# Patient Record
Sex: Male | Born: 1965 | Race: White | Hispanic: No | Marital: Married | Smoking: Never smoker
Health system: Southern US, Community
[De-identification: ages and names within clinical notes are randomized; demographics above are authoritative.]

## PROBLEM LIST (undated history)

## (undated) DIAGNOSIS — Z98811 Dental restoration status: Secondary | ICD-10-CM

## (undated) DIAGNOSIS — Z87442 Personal history of urinary calculi: Secondary | ICD-10-CM

## (undated) DIAGNOSIS — Z955 Presence of coronary angioplasty implant and graft: Secondary | ICD-10-CM

## (undated) DIAGNOSIS — E785 Hyperlipidemia, unspecified: Secondary | ICD-10-CM

## (undated) DIAGNOSIS — J189 Pneumonia, unspecified organism: Secondary | ICD-10-CM

## (undated) DIAGNOSIS — N200 Calculus of kidney: Secondary | ICD-10-CM

## (undated) DIAGNOSIS — I219 Acute myocardial infarction, unspecified: Secondary | ICD-10-CM

## (undated) DIAGNOSIS — J302 Other seasonal allergic rhinitis: Secondary | ICD-10-CM

## (undated) DIAGNOSIS — Z87820 Personal history of traumatic brain injury: Secondary | ICD-10-CM

## (undated) DIAGNOSIS — Z86718 Personal history of other venous thrombosis and embolism: Secondary | ICD-10-CM

## (undated) DIAGNOSIS — Z8711 Personal history of peptic ulcer disease: Secondary | ICD-10-CM

## (undated) DIAGNOSIS — K219 Gastro-esophageal reflux disease without esophagitis: Secondary | ICD-10-CM

## (undated) DIAGNOSIS — I10 Essential (primary) hypertension: Secondary | ICD-10-CM

## (undated) DIAGNOSIS — Z9889 Other specified postprocedural states: Secondary | ICD-10-CM

## (undated) DIAGNOSIS — R112 Nausea with vomiting, unspecified: Secondary | ICD-10-CM

## (undated) DIAGNOSIS — R454 Irritability and anger: Secondary | ICD-10-CM

## (undated) DIAGNOSIS — M1990 Unspecified osteoarthritis, unspecified site: Secondary | ICD-10-CM

## (undated) DIAGNOSIS — I2510 Atherosclerotic heart disease of native coronary artery without angina pectoris: Secondary | ICD-10-CM

## (undated) DIAGNOSIS — E663 Overweight: Secondary | ICD-10-CM

## (undated) DIAGNOSIS — F419 Anxiety disorder, unspecified: Secondary | ICD-10-CM

## (undated) HISTORY — DX: Essential (primary) hypertension: I10

## (undated) HISTORY — PX: CORONARY ANGIOPLASTY WITH STENT PLACEMENT: SHX49

## (undated) HISTORY — DX: Atherosclerotic heart disease of native coronary artery without angina pectoris: I25.10

## (undated) HISTORY — PX: CARPAL TUNNEL RELEASE: SHX101

## (undated) HISTORY — DX: Anxiety disorder, unspecified: F41.9

## (undated) HISTORY — PX: KNEE CARTILAGE SURGERY: SHX688

## (undated) HISTORY — DX: Hyperlipidemia, unspecified: E78.5

## (undated) HISTORY — PX: OTHER SURGICAL HISTORY: SHX169

## (undated) HISTORY — DX: Overweight: E66.3

## (undated) HISTORY — PX: KIDNEY STONE SURGERY: SHX686

## (undated) HISTORY — DX: Calculus of kidney: N20.0

## (undated) SURGERY — REPAIR, TENDON, BICEPS, DISTAL
Anesthesia: Choice | Laterality: Left

---

## 1997-02-11 HISTORY — PX: WRIST SURGERY: SHX841

## 1998-10-30 ENCOUNTER — Emergency Department (HOSPITAL_COMMUNITY): Admission: EM | Admit: 1998-10-30 | Discharge: 1998-10-30 | Payer: Self-pay | Admitting: Emergency Medicine

## 1998-11-06 ENCOUNTER — Ambulatory Visit (HOSPITAL_COMMUNITY): Admission: RE | Admit: 1998-11-06 | Discharge: 1998-11-06 | Payer: Self-pay | Admitting: Specialist

## 2003-01-05 ENCOUNTER — Emergency Department (HOSPITAL_COMMUNITY): Admission: EM | Admit: 2003-01-05 | Discharge: 2003-01-05 | Payer: Self-pay | Admitting: Emergency Medicine

## 2004-05-18 ENCOUNTER — Emergency Department (HOSPITAL_COMMUNITY): Admission: EM | Admit: 2004-05-18 | Discharge: 2004-05-18 | Payer: Self-pay | Admitting: Emergency Medicine

## 2004-08-04 ENCOUNTER — Ambulatory Visit (HOSPITAL_BASED_OUTPATIENT_CLINIC_OR_DEPARTMENT_OTHER): Admission: RE | Admit: 2004-08-04 | Discharge: 2004-08-04 | Payer: Self-pay | Admitting: Specialist

## 2004-08-04 HISTORY — PX: CHONDROPLASTY: SHX5177

## 2004-12-05 ENCOUNTER — Ambulatory Visit (HOSPITAL_BASED_OUTPATIENT_CLINIC_OR_DEPARTMENT_OTHER): Admission: RE | Admit: 2004-12-05 | Discharge: 2004-12-05 | Payer: Self-pay | Admitting: Specialist

## 2004-12-05 ENCOUNTER — Ambulatory Visit (HOSPITAL_COMMUNITY): Admission: RE | Admit: 2004-12-05 | Discharge: 2004-12-05 | Payer: Self-pay | Admitting: Specialist

## 2004-12-05 ENCOUNTER — Encounter (INDEPENDENT_AMBULATORY_CARE_PROVIDER_SITE_OTHER): Payer: Self-pay | Admitting: Specialist

## 2004-12-05 HISTORY — PX: PREPATELLAR BURSA EXCISION: SUR547

## 2005-03-14 DIAGNOSIS — Z86718 Personal history of other venous thrombosis and embolism: Secondary | ICD-10-CM

## 2005-03-14 HISTORY — DX: Personal history of other venous thrombosis and embolism: Z86.718

## 2008-03-23 ENCOUNTER — Emergency Department (HOSPITAL_COMMUNITY): Admission: EM | Admit: 2008-03-23 | Discharge: 2008-03-23 | Payer: Self-pay | Admitting: Emergency Medicine

## 2008-03-29 ENCOUNTER — Encounter: Admission: RE | Admit: 2008-03-29 | Discharge: 2008-03-29 | Payer: Self-pay | Admitting: Family Medicine

## 2008-03-29 ENCOUNTER — Emergency Department (HOSPITAL_COMMUNITY): Admission: EM | Admit: 2008-03-29 | Discharge: 2008-03-29 | Payer: Self-pay | Admitting: Emergency Medicine

## 2010-10-03 ENCOUNTER — Inpatient Hospital Stay (HOSPITAL_COMMUNITY): Admission: EM | Admit: 2010-10-03 | Discharge: 2010-10-06 | Payer: Self-pay | Admitting: Emergency Medicine

## 2010-10-03 ENCOUNTER — Ambulatory Visit: Payer: Self-pay | Admitting: Cardiology

## 2010-10-05 ENCOUNTER — Encounter (INDEPENDENT_AMBULATORY_CARE_PROVIDER_SITE_OTHER): Payer: Self-pay | Admitting: Internal Medicine

## 2010-10-06 HISTORY — PX: CARDIAC CATHETERIZATION: SHX172

## 2010-10-15 ENCOUNTER — Ambulatory Visit: Payer: Self-pay | Admitting: Cardiology

## 2010-10-15 ENCOUNTER — Encounter: Payer: Self-pay | Admitting: Cardiovascular Disease

## 2010-10-30 ENCOUNTER — Telehealth: Payer: Self-pay | Admitting: Cardiovascular Disease

## 2010-11-14 ENCOUNTER — Encounter: Admission: RE | Admit: 2010-11-14 | Discharge: 2010-11-14 | Payer: Self-pay | Admitting: Cardiology

## 2010-11-14 ENCOUNTER — Ambulatory Visit: Payer: Self-pay | Admitting: Cardiology

## 2010-11-14 ENCOUNTER — Encounter: Payer: Self-pay | Admitting: Cardiovascular Disease

## 2010-11-20 ENCOUNTER — Ambulatory Visit: Payer: Self-pay

## 2010-11-21 ENCOUNTER — Encounter: Payer: Self-pay | Admitting: Cardiovascular Disease

## 2010-11-21 ENCOUNTER — Encounter
Admission: RE | Admit: 2010-11-21 | Discharge: 2010-11-21 | Payer: Self-pay | Source: Home / Self Care | Attending: Cardiology | Admitting: Cardiology

## 2010-11-26 ENCOUNTER — Encounter: Payer: Self-pay | Admitting: Cardiovascular Disease

## 2010-11-26 ENCOUNTER — Ambulatory Visit: Payer: Self-pay | Admitting: Cardiology

## 2010-12-30 ENCOUNTER — Encounter: Payer: Self-pay | Admitting: Cardiovascular Disease

## 2011-01-01 ENCOUNTER — Ambulatory Visit
Admission: RE | Admit: 2011-01-01 | Discharge: 2011-01-01 | Payer: Self-pay | Source: Home / Self Care | Attending: Cardiovascular Disease | Admitting: Cardiovascular Disease

## 2011-01-01 ENCOUNTER — Ambulatory Visit: Payer: Self-pay | Admitting: Cardiology

## 2011-01-01 DIAGNOSIS — M79609 Pain in unspecified limb: Secondary | ICD-10-CM | POA: Insufficient documentation

## 2011-01-06 ENCOUNTER — Telehealth: Payer: Self-pay | Admitting: Cardiovascular Disease

## 2011-01-13 NOTE — Progress Notes (Signed)
Summary: groin pain since heart cath  Phone Note Call from Patient   Caller: Patient [phone] Reason for Call: Talk to Nurse Summary of Call: pt  had heart cath done a few weeks ago-having pain in groin since then-quite uncomfortable-pls advise  Initial call taken by: Glynda Jaeger,  October 30, 2010 10:47 AM  Follow-up for Phone Call        Pt had cardiac cath 10/06/10 by Dr Excell Seltzer. I spoke with the pt because his groin is still sore from cath.  The pt said it feels like a cramp in his leg that does not go away if he sits for a long time.  The pt denies swelling at site, but does have a small amount of bruising that is getting better.  The pt did follow-up with Dr Deborah [facility]alk post [facility].  I made the pt aware that the nerve that runs close to femerol artery could be irritated.  If the pt continues to have discomfort at groin site he will contact Dr Ronnald Nian office.  Follow-up by: Julieta Gutting, RN, BSN,  October 30, 2010 11:14 AM

## 2011-01-15 NOTE — Assessment & Plan Note (Signed)
Summary: 2nd opinion for groin pain   Visit Type:  Initial consult Referring Provider:  Dr Deborah Chalk  CC:  Groin pain .  History of Present Illness: 45 year-old male referred for groin pain following cardiac catheterization in October 2011. The patient underwent uncomplicated cardiac catheterization via the right femoral artery and hemostasis was achieved with a PerClose suture. He has developed chronic right groin pain ever since the procedure, and reports no improvement in the pain over time. He has been evaluated with a right groin duplex ultrasound demonstrating normal arterial and venous flow. CT scan showed no hemorrhage or vascular abnormalities.   The patient states the pain is in the right groin area and radiates down to the distal thigh just above the right knee. Pain is exacerbated by prolonged standing or playing the drums. Also worse with other movements involving leg flexion. No calf or foot pain. No other complaints. 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  Current Medications (verified): 1)  Gabapentin 100 Mg Caps (Gabapentin) .... 4 Am and 5 Pm 2)  Gemfibrozil 600 Mg Tabs (Gemfibrozil) .... Take 1 Tablet By Mouth Two Times A Day 3)  Humulin N Pen 100 Unit/ml Susp (Insulin Isophane Human) .... 50 Units Two Times A Day 4)  Vitamin C 500 Mg Tabs (Ascorbic Acid) .... Take 1 Tablet By Mouth Once A Day 5)  Fish Oil 1200 Mg Caps (Omega-3 Fatty Acids) .... Take 1 Capsule By Mouth Once A Day 6)  Metformin Hcl 1000 Mg Tabs (Metformin Hcl) .... Take 1 Tablet By Mouth Two Times A Day 7)  Lisinopril 10 Mg Tabs (Lisinopril) .... Take One Tablet By Mouth Daily 8)  Pravastatin Sodium 40 Mg Tabs (Pravastatin Sodium) .... Take One Tablet By Mouth Daily At Bedtime 9)  Aspirin 81 Mg Tbec (Aspirin) .... Take One Tablet By Mouth Daily 10)  Alprazolam 1 Mg Tabs (Alprazolam) .Marland Kitchen.. 1 Am and 3 At Bedtime 11)  Metoprolol Tartrate 50 Mg Tabs (Metoprolol Tartrate)  .... Take One Tablet By Mouth Twice A Day 12)  Nitrostat 0.4 Mg Subl (Nitroglycerin) .Marland Kitchen.. 1 Tablet Under Tongue At Onset of Chest Pain; You May Repeat Every 5 Minutes For Up To 3 Doses. 13)  E-400 400 Unit Caps (Vitamin E) .... Take 1 Capsule By Mouth Two Times A Day 14)  Januvia 100 Mg Tabs (Sitagliptin Phosphate) .... Take 1 Tablet By Mouth Once A Day 15)  Lidoderm 5 % Ptch (Lidocaine) .... One Every 12 Hours  Allergies (verified): No Known Drug Allergies  Past History:  Past medical history reviewed for relevance to current acute and chronic problems.  Past Medical History: Reviewed history from 12/31/2010 and no changes required.  1. History of coronary artery disease with a prior drug-eluting stent       to the right coronary artery 5 years back.   2. Hypertension.   3. Diabetes.   4. Combined dyslipidemia.   5. History of anxiety and depression.          Vital Signs:  Patient profile:   45 year old male Height:      71 inches Weight:      270 pounds BMI:     37.79 Pulse rate:   80 / minute Pulse rhythm:   regular Resp:     18 per minute BP sitting:   132 / 90  (right arm) Cuff size:   large  Vitals Entered By: Vikki Ports (January 01, 2011 12:05 PM)  Physical Exam  General:  Pt is alert and oriented, in no acute distress. HEENT: normal Lungs: CTA CV: RRR without murmur or gallop Abd: soft, NT, positive BS, no bruit, no organomegaly Ext: no clubbing, cyanosis, or edema. peripheral pulses 2+ and equal Right groin with mild tenderness to deep palpation. Femoral pulses 2+=. There is no right femoral bruit or mass.    Venous Doppler  Procedure date:  11/21/2010  Findings:      Normal right common femoral and artery  CT Scan  Procedure date:  11/14/2010  Findings:      Intact vascular structures, no RP hematoma.  Impression & Recommendations:  Problem # 1:  PAIN IN LIMB (ICD-729.5) Pt with chronic right groin pain after cardiac cath and PerClose  device for arterial hemostasis. There does not appear to be any gross vascular injury. I think the most likely etiology is nerve injury, but it is unusual that the patient would experience this degree of pain now 3 months removed from the procedure. I don't think further imaging studies will be revealing as he has already had a normal ultrasound and contrasted CT scan. I have recommended adding a 400 mg neurontin dose in the afternoon to his current twice daily dosing. I have also referred him to the pain management clinic as there may be another modality available to treat his pain that I am not aware of, i.e. nerve block.  Orders: Pain Clinic Referral (Pain)  Patient Instructions: 1)  Your physician recommends that you schedule a follow-up appointment with Dr Deborah Chalk.  2)  Your physician has recommended you make the following change in your medication: INCREASE Neurontin to 400mg  every morning, 400mg  every afternoon, and 500mg  at bedtime for one month--future prescriptions should come from Primary Care Provider 3)  You have been referred to the PAIN CLINIC.  Prescriptions: NEURONTIN 100 MG CAPS (GABAPENTIN) take 4 by mouth every morning, 4 by mouth every afternoon, and 5 by mouth at bedtime  #390 x 0   Entered by:   Julieta Gutting, RN, BSN   Authorized by:   Norva Karvonen, MD   Signed by:   Julieta Gutting, RN, BSN on 01/01/2011   Method used:   Electronically to        [facility]  [address] [phone]* (retail)       [address]       [place], [place]  [address]       Ph: [phone] or [phone]       Fax: [phone]   RxID:   [id number]

## 2011-01-15 NOTE — Progress Notes (Signed)
Summary: question re cath in 10/11- c/o pain with nerves- CALL AFTER 1PM  Phone Note Call from Patient Call back at Work Phone [phone] Call back at call after 1 p.m. due to job interview.    Caller: Patient Reason for Call: Talk to Nurse Summary of Call: pt have several question to ask. aware that lauren is not here today. pt had heart cath in 10/11. c/o still having problem with the nerve.can pt be referral to  [place] pain clinic.  Initial call taken by: Lorne Skeens,  January 06, 2011 10:33 AM  Follow-up for Phone Call        Pt. states since he had a cardiac cath. has been having pain in his right groin. Pt. states it always feels tight and when he moves certain way, it is very painful. It feels like it is a tear in there. He wanders if a stitch hit a nerve. Pt. would like to know if he can be referred to a pain clinic here in [facility] or [address], Where ever he can be seen sooner.  Spoke with the patient.  There is an order for a referral to the pain clinic. Advised patient I would make sure the schedulers are aware that this order is in & to call us back if he doesn't get a call within the next few days. Whitney Maeola Sarah RN  January 06, 2011 5:41 PM  Follow-up by: Ollen Gross, RN, BSN,  January 06, 2011 11:12 AM

## 2011-01-21 ENCOUNTER — Telehealth (INDEPENDENT_AMBULATORY_CARE_PROVIDER_SITE_OTHER): Payer: Self-pay | Admitting: *Deleted

## 2011-01-26 ENCOUNTER — Encounter: Payer: Self-pay | Admitting: Cardiovascular Disease

## 2011-01-29 NOTE — Progress Notes (Signed)
  Received pt's ROI via Fax.Marland KitchenMarland KitchenFaxed all Cardiac/Hospital recods over to [facility] attn Dr.Cheek @ [phone] [facility]  January 21, 2011 3:40 PM

## 2011-02-10 ENCOUNTER — Encounter: Payer: 59 | Attending: Physical Medicine & Rehabilitation

## 2011-02-10 ENCOUNTER — Ambulatory Visit (HOSPITAL_BASED_OUTPATIENT_CLINIC_OR_DEPARTMENT_OTHER): Payer: 59 | Admitting: Physical Medicine & Rehabilitation

## 2011-02-10 DIAGNOSIS — R109 Unspecified abdominal pain: Secondary | ICD-10-CM | POA: Insufficient documentation

## 2011-02-10 DIAGNOSIS — I2510 Atherosclerotic heart disease of native coronary artery without angina pectoris: Secondary | ICD-10-CM | POA: Insufficient documentation

## 2011-02-10 DIAGNOSIS — E119 Type 2 diabetes mellitus without complications: Secondary | ICD-10-CM | POA: Insufficient documentation

## 2011-02-10 DIAGNOSIS — G5720 Lesion of femoral nerve, unspecified lower limb: Secondary | ICD-10-CM

## 2011-02-10 DIAGNOSIS — Z79899 Other long term (current) drug therapy: Secondary | ICD-10-CM | POA: Insufficient documentation

## 2011-02-10 DIAGNOSIS — E7800 Pure hypercholesterolemia, unspecified: Secondary | ICD-10-CM | POA: Insufficient documentation

## 2011-02-10 NOTE — Letter (Signed)
Summary: [facility]   Imported By: Marylou Mccoy 02/04/2011 10:37:55  _____________________________________________________________________  External Attachment:    Type:   Image     Comment:   External Document

## 2011-02-10 NOTE — Letter (Signed)
Summary: [facility]  [facility]   Imported By: Marylou Mccoy 02/04/2011 10:39:25  _____________________________________________________________________  External Attachment:    Type:   Image     Comment:   External Document

## 2011-02-10 NOTE — Letter (Signed)
Summary: [facility]  [facility]   Imported By: Marylou Mccoy 02/04/2011 10:39:01  _____________________________________________________________________  External Attachment:    Type:   Image     Comment:   External Document

## 2011-02-10 NOTE — Letter (Signed)
Summary: [facility]  [facility]   Imported By: Marylou Mccoy 02/04/2011 10:23:47  _____________________________________________________________________  External Attachment:    Type:   Image     Comment:   External Document

## 2011-02-24 NOTE — Letter (Signed)
Summary: [facility]  [facility]   Imported By: Marylou Mccoy 02/18/2011 15:50:49  _____________________________________________________________________  External Attachment:    Type:   Image     Comment:   External Document

## 2011-02-25 LAB — CBC
HCT: 37.4 % — ABNORMAL LOW (ref 39.0–52.0)
HCT: 38.5 % — ABNORMAL LOW (ref 39.0–52.0)
HCT: 40.4 % (ref 39.0–52.0)
Hemoglobin: 13.9 g/dL (ref 13.0–17.0)
MCH: 29.4 pg (ref 26.0–34.0)
MCH: 30.7 pg (ref 26.0–34.0)
MCHC: 34 g/dL (ref 30.0–36.0)
MCHC: 34.3 g/dL (ref 30.0–36.0)
MCHC: 34.3 g/dL (ref 30.0–36.0)
MCHC: 34.4 g/dL (ref 30.0–36.0)
MCV: 88.8 fL (ref 78.0–100.0)
MCV: 89.2 fL (ref 78.0–100.0)
Platelets: 219 10*3/uL (ref 150–400)
Platelets: 230 10*3/uL (ref 150–400)
Platelets: 246 10*3/uL (ref 150–400)
RBC: 4.15 MIL/uL — ABNORMAL LOW (ref 4.22–5.81)
RBC: 4.53 MIL/uL (ref 4.22–5.81)
RDW: 12.5 % (ref 11.5–15.5)
RDW: 12.5 % (ref 11.5–15.5)
RDW: 12.6 % (ref 11.5–15.5)
RDW: 12.6 % (ref 11.5–15.5)
RDW: 12.6 % (ref 11.5–15.5)
WBC: 6.7 10*3/uL (ref 4.0–10.5)
WBC: 8.1 10*3/uL (ref 4.0–10.5)
WBC: 8.7 10*3/uL (ref 4.0–10.5)

## 2011-02-25 LAB — CARDIAC PANEL(CRET KIN+CKTOT+MB+TROPI)
CK, MB: 0.9 ng/mL (ref 0.3–4.0)
CK, MB: 1 ng/mL (ref 0.3–4.0)
CK, MB: 1.1 ng/mL (ref 0.3–4.0)
Relative Index: INVALID (ref 0.0–2.5)
Relative Index: INVALID (ref 0.0–2.5)
Relative Index: INVALID (ref 0.0–2.5)
Total CK: 80 U/L (ref 7–232)
Total CK: 84 U/L (ref 7–232)
Total CK: 90 U/L (ref 7–232)
Troponin I: <0.01 ng/mL (ref 0.00–0.06)
Troponin I: <0.01 ng/mL (ref 0.00–0.06)
Troponin I: <0.01 ng/mL (ref 0.00–0.06)

## 2011-02-25 LAB — COMPREHENSIVE METABOLIC PANEL
ALT: 25 U/L (ref 0–53)
AST: 23 U/L (ref 0–37)
Albumin: 4.1 g/dL (ref 3.5–5.2)
Alkaline Phosphatase: 40 U/L (ref 39–117)
BUN: 15 mg/dL (ref 6–23)
CO2: 23 mEq/L (ref 19–32)
Calcium: 9.6 mg/dL (ref 8.4–10.5)
Chloride: 103 mEq/L (ref 96–112)
Creatinine, Ser: 1.27 mg/dL (ref 0.4–1.5)
GFR calc Af Amer: >60 mL/min (ref >60)
GFR calc non Af Amer: >60 mL/min (ref >60)
Glucose, Bld: 110 mg/dL — ABNORMAL HIGH (ref 70–99)
Potassium: 3.9 mEq/L (ref 3.5–5.1)
Sodium: 137 mEq/L (ref 135–145)
Total Bilirubin: 0.5 mg/dL (ref 0.3–1.2)
Total Protein: 7.5 g/dL (ref 6.0–8.3)

## 2011-02-25 LAB — CK TOTAL AND CKMB (NOT AT ARMC)
CK, MB: 1.2 ng/mL (ref 0.3–4.0)
Relative Index: 1.1 (ref 0.0–2.5)
Total CK: 105 U/L (ref 7–232)

## 2011-02-25 LAB — GLUCOSE, CAPILLARY
Glucose-Capillary: 106 mg/dL — ABNORMAL HIGH (ref 70–99)
Glucose-Capillary: 106 mg/dL — ABNORMAL HIGH (ref 70–99)
Glucose-Capillary: 124 mg/dL — ABNORMAL HIGH (ref 70–99)
Glucose-Capillary: 154 mg/dL — ABNORMAL HIGH (ref 70–99)
Glucose-Capillary: 157 mg/dL — ABNORMAL HIGH (ref 70–99)
Glucose-Capillary: 160 mg/dL — ABNORMAL HIGH (ref 70–99)
Glucose-Capillary: 187 mg/dL — ABNORMAL HIGH (ref 70–99)
Glucose-Capillary: 188 mg/dL — ABNORMAL HIGH (ref 70–99)
Glucose-Capillary: 221 mg/dL — ABNORMAL HIGH (ref 70–99)
Glucose-Capillary: 65 mg/dL — ABNORMAL LOW (ref 70–99)
Glucose-Capillary: 86 mg/dL (ref 70–99)
Glucose-Capillary: 99 mg/dL (ref 70–99)

## 2011-02-25 LAB — HEPARIN LEVEL (UNFRACTIONATED)
Heparin Unfractionated: 0.17 IU/mL — ABNORMAL LOW (ref 0.30–0.70)
Heparin Unfractionated: 0.27 IU/mL — ABNORMAL LOW (ref 0.30–0.70)
Heparin Unfractionated: 0.34 IU/mL (ref 0.30–0.70)

## 2011-02-25 LAB — LIPID PANEL
Cholesterol: 178 mg/dL (ref 0–200)
HDL: 37 mg/dL — ABNORMAL LOW (ref >39)
LDL Cholesterol: 90 mg/dL (ref 0–99)
Total CHOL/HDL Ratio: 4.8 RATIO
Triglycerides: 253 mg/dL — ABNORMAL HIGH (ref <150)
VLDL: 51 mg/dL — ABNORMAL HIGH (ref 0–40)

## 2011-02-25 LAB — POCT CARDIAC MARKERS
CKMB, poc: <1 ng/mL — ABNORMAL LOW (ref 1.0–8.0)
Myoglobin, poc: 65.5 ng/mL (ref 12–200)
Troponin i, poc: <0.05 ng/mL (ref 0.00–0.09)

## 2011-02-25 LAB — MRSA PCR SCREENING: MRSA by PCR: NEGATIVE

## 2011-02-25 LAB — TROPONIN I
Troponin I: 0.02 ng/mL (ref 0.00–0.06)
Troponin I: <0.01 ng/mL (ref 0.00–0.06)

## 2011-02-25 LAB — HEMOGLOBIN A1C
Hgb A1c MFr Bld: 9.7 % — ABNORMAL HIGH (ref <5.7)
Mean Plasma Glucose: 232 mg/dL — ABNORMAL HIGH (ref <117)

## 2011-02-25 LAB — PROTIME-INR
INR: 1.03 (ref 0.00–1.49)
Prothrombin Time: 13.7 seconds (ref 11.6–15.2)

## 2011-02-25 LAB — APTT: aPTT: 31 seconds (ref 24–37)

## 2011-03-06 NOTE — Consult Note (Addendum)
CHIEF COMPLAINT:  Right groin pain.  A 45 year old male with history of coronary artery disease and diabetes as well as hypercholesterolemia who underwent a cardiac cath procedure in October 2011.  Postprocedure, he complained of groin pain on the right side, which was where the femoral sheath was introduced.  He had evaluation by Vascular Surgery, normal arterial and deep venous Dopplers.  He complains of some pain that radiates into the anterior thigh.  No weakness in the quad.  His pain does increase with playing drums.  His pain is described as constant and sharp when it radiates, but overall a dull ache about 5/10.  He has had fair relief with his Neurontin, has had somewhat better relief since increasing from 300 to 400 mg three times per day.  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  REVIEW OF SYSTEMS:  Positive for depression and blood sugar regulation problems, sees Dr. Evelene Croon from Psychiatry and Dr. Gerri Spore for his diabetes.  Denies any back pain.  No history of hip joint pain.  SOCIAL HISTORY:  Married, lives with his wife and two kids.  Denies any drug or alcohol abuse.  FAMILY HISTORY:  Heart disease, diabetes, hypertension, alcohol abuse.  PHYSICAL EXAMINATION:  VITAL SIGNS:  His blood pressure is 127/72, pulse 83, respiratory rate is 18, O2 sat 96% on room air. GENERAL:  He is an overweight male in no acute distress.  His gait is normal.  Affect is bright and alert. EXTREMITIES:  Without edema.  His lower extremity strength is normal in the hip flexors, knee extensors, ankle dorsiflexors.  His back has no tenderness to palpation.  He has normal lumbar range of motion.  He has normal sensation in bilateral lower extremities to pinprick, normal deep tendon reflexes in bilateral lower extremities.  Normal hip internal- external rotation.  Straight leg raising test is  negative.  His right inguinal area has a weak femoral pulse, but this has to do more with.  His body habitus is actually equal compared to the other side.  There is no evidence of scarring or skin breakdown.  Negative thrill.  His popliteal and pedal pulses are equal bilaterally in the lower extremities.  IMPRESSION:  Right groin pain radiating into the right thigh, appears to be femoral nerve irritation.  He is getting just sensory symptoms rather than motor.  We discussed treatment options.  Certainly, he has been responding to gabapentin and he is not on a full dose.  We will increase to 400 q.i.d. and then to 600 t.i.d. monitoring for drowsiness.  We discussed interventional pain procedures including femoral nerve block, which we can do with lidocaine and corticosteroid.  We discussed the method of performing this block, which would include ultrasound and nerve stimulation guidance and this may be only a short-term relief. Discussed risks and benefits of the procedure including vascular injury, nerve injury, increased pain or non-efficacy, the patient elects to proceed.  We discussed activity modification in particularly some of the driving activity that seemed to provoke this.  Discussed with the patient, agrees with plan.  We will schedule for right femoral nerve block.  He will need a driver given that he may get some sensory loss or even some of motor weakness in the right thigh at least for the postprocedural time.     [name], M.D.    Colan Neptune D:02/10/2011 16:07:39  T:02/10/2011 21:25:50  Job #:  [id number]  cc:   Veverly Fells. Excell Seltzer, MD [address] [place], [place] [address]  Colleen Can. Deborah Chalk, M.D. Fax: [phone]  Milagros Evener, M.D. Fax: [phone]  Carola J. Gerri Spore, M.D. Fax: [phone]  Electronically Signed by Claudette Laws M.D. on 02/14/2011 01:02:05 PM CHIEF COMPLAINT:  Right groin pain.  A 45 year old male with history of  coronary artery disease and diabetes as well as hypercholesterolemia who underwent a cardiac cath procedure in October 2011.  Postprocedure, he complained of groin pain on the right side, which was where the femoral sheath was introduced.  He had evaluation by Vascular Surgery, normal arterial and deep venous Dopplers.  He complains of some pain that radiates into the anterior thigh.  No weakness in the quad.  His pain does increase with playing drums.  His pain is described as constant and sharp when it radiates, but overall a dull ache about 5/10.  He has had fair relief with his Neurontin, has had somewhat better relief since increasing from 300 to 400 mg three times per day.  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  REVIEW OF SYSTEMS:  Positive for depression and blood sugar regulation problems, sees Dr. Evelene Croon from Psychiatry and Dr. Gerri Spore for his diabetes.  Denies any back pain.  No history of hip joint pain.  SOCIAL HISTORY:  Married, lives with his wife and two kids.  Denies any drug or alcohol abuse.  FAMILY HISTORY:  Heart disease, diabetes, hypertension, alcohol abuse.  PHYSICAL EXAMINATION:  VITAL SIGNS:  His blood pressure is 127/72, pulse 83, respiratory rate is 18, O2 sat 96% on room air. GENERAL:  He is an overweight male in no acute distress.  His gait is normal.  Affect is bright and alert. EXTREMITIES:  Without edema.  His lower extremity strength is normal in the hip flexors, knee extensors, ankle dorsiflexors.  His back has no tenderness to palpation.  He has normal lumbar range of motion.  He has normal sensation in bilateral lower extremities to pinprick, normal deep tendon reflexes in bilateral lower extremities.  Normal hip internal- external rotation.  Straight leg raising test is negative.  His right inguinal area has a weak femoral pulse, but this has to  do more with.  His body habitus is actually equal compared to the other side.  There is no evidence of scarring or skin breakdown.  Negative thrill.  His popliteal and pedal pulses are equal bilaterally in the lower extremities.  IMPRESSION:  Right groin pain radiating into the right thigh, appears to be femoral nerve irritation.  He is getting just sensory symptoms rather than motor.  We discussed treatment options.  Certainly, he has been responding to gabapentin and he is not on a full dose.  We will increase to 400 q.i.d. and then to 600 t.i.d. monitoring for drowsiness.  We discussed interventional pain procedures including femoral nerve block, which we can do with lidocaine and corticosteroid.  We discussed the method of performing this block, which would include ultrasound and nerve stimulation guidance and this may be only a short-term relief. Discussed risks and benefits of the procedure including vascular injury, nerve injury, increased pain or non-efficacy, the patient elects to proceed.  We discussed activity modification in particularly some of the driving activity that seemed to provoke this.  Discussed with the patient, agrees with plan.  We will  schedule for right femoral nerve block.  He will need a driver given that he may get some sensory loss or even some of motor weakness in the right thigh at least for the postprocedural time.     [name], M.D. Electronically Signed    AEK/MedQ D:02/10/2011 16:07:39  T:02/10/2011 21:25:50  Job #:  [id number]  cc:   Veverly Fells. Excell Seltzer, MD [address] [place], [place] [address]  Colleen Can. Deborah Chalk, M.D. Fax: [phone]  Milagros Evener, M.D. Fax: [phone]  Carola J. Gerri Spore, M.D. Fax: [phone]

## 2011-03-13 ENCOUNTER — Encounter: Payer: 59 | Attending: Physical Medicine & Rehabilitation

## 2011-03-13 DIAGNOSIS — R109 Unspecified abdominal pain: Secondary | ICD-10-CM | POA: Insufficient documentation

## 2011-03-13 DIAGNOSIS — E119 Type 2 diabetes mellitus without complications: Secondary | ICD-10-CM | POA: Insufficient documentation

## 2011-03-13 DIAGNOSIS — Z79899 Other long term (current) drug therapy: Secondary | ICD-10-CM | POA: Insufficient documentation

## 2011-03-13 DIAGNOSIS — E7800 Pure hypercholesterolemia, unspecified: Secondary | ICD-10-CM | POA: Insufficient documentation

## 2011-03-13 DIAGNOSIS — I2510 Atherosclerotic heart disease of native coronary artery without angina pectoris: Secondary | ICD-10-CM | POA: Insufficient documentation

## 2011-03-16 ENCOUNTER — Encounter: Payer: 59 | Attending: Physical Medicine & Rehabilitation

## 2011-03-16 ENCOUNTER — Ambulatory Visit (HOSPITAL_BASED_OUTPATIENT_CLINIC_OR_DEPARTMENT_OTHER): Payer: 59 | Admitting: Physical Medicine & Rehabilitation

## 2011-03-16 DIAGNOSIS — G5720 Lesion of femoral nerve, unspecified lower limb: Secondary | ICD-10-CM

## 2011-03-16 DIAGNOSIS — G5790 Unspecified mononeuropathy of unspecified lower limb: Secondary | ICD-10-CM | POA: Insufficient documentation

## 2011-03-17 NOTE — Procedures (Signed)
NAME:  Elijah Watkins, Elijah Watkins NO.:  [id number]  MEDICAL RECORD NO.:  [id number]           PATIENT TYPE:  LOCATION:                                 FACILITY:  PHYSICIAN:  [name], M.D.DATE OF BIRTH:  [birth year]  DATE OF PROCEDURE:  03/16/2011 DATE OF DISCHARGE:                              OPERATIVE REPORT  PROCEDURE:  This is right femoral nerve block under ultrasound guidance.  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  Pain is rated up to 6/10, and with certain activities even up to 9.  Informed consent was obtained after describing risks and benefits of the procedure with patient.  These include bleeding, bruising, and infection.  He elects to proceed and has given written consent.  The patient scanned right inguinal area, femoral artery, and femoral nerve identified area, marked and prepped with chlorhexidine, entered with 22-gauge 80-mm echo block needle.  Needle was directed to the area lateral to the left femoral artery under continuous ultrasound guidance. Color Doppler was utilized intermittently during the procedure.  Once target was reached, 1 mL of 40 mg/mL Depo-Medrol and 3 mL of 1% lidocaine injected.  The patient tolerated procedure well.  After appropriate post procedure monitoring, the patient was able to stand without evidence of weakness in his right quad.  The patient tolerated the procedure well.  Postprocedure instructions given.  Continue current medications including Lidoderm patch and Neurontin, and we will see him back in 2-3 weeks in followup.     [name], M.D. Electronically Signed    AEK/MEDQ  D:  03/16/2011 11:57:51  T:  03/17/2011 02:08:01  Job:  [id number]  cc:   Veverly Fells. Excell Seltzer, MD [address] [place], [place] [address]  Otilio Connors. Gerri Spore, M.D. Fax: [phone]

## 2011-03-19 ENCOUNTER — Ambulatory Visit: Payer: 59 | Admitting: Physical Medicine & Rehabilitation

## 2011-04-14 ENCOUNTER — Ambulatory Visit: Payer: 59 | Admitting: Physical Medicine & Rehabilitation

## 2011-04-17 ENCOUNTER — Ambulatory Visit (HOSPITAL_BASED_OUTPATIENT_CLINIC_OR_DEPARTMENT_OTHER): Payer: 59 | Admitting: Physical Medicine & Rehabilitation

## 2011-04-17 DIAGNOSIS — M79609 Pain in unspecified limb: Secondary | ICD-10-CM | POA: Insufficient documentation

## 2011-04-17 DIAGNOSIS — G608 Other hereditary and idiopathic neuropathies: Secondary | ICD-10-CM | POA: Insufficient documentation

## 2011-04-17 DIAGNOSIS — G5720 Lesion of femoral nerve, unspecified lower limb: Secondary | ICD-10-CM

## 2011-04-17 DIAGNOSIS — G5790 Unspecified mononeuropathy of unspecified lower limb: Secondary | ICD-10-CM | POA: Insufficient documentation

## 2011-04-17 DIAGNOSIS — Z79899 Other long term (current) drug therapy: Secondary | ICD-10-CM | POA: Insufficient documentation

## 2011-04-20 NOTE — Assessment & Plan Note (Signed)
REASON FOR VISIT:  Right thigh pain.  HISTORY:  A 45 year old male, who I saw last on March 16, 2011, when we did a right femoral nerve block under ultrasound guidance. Preinjection, his pain was 6/10 at rest, and 9/10 with activity even with Neurontin and Lidoderm patch.  He returns now 1 month.  He returns today.  His pain is in the 0-1 range.  He has quit using his Lidoderm patches, continues on his Neurontin.  REVIEW OF SYSTEMS:  Otherwise negative.  PHYSICAL EXAMINATION:  VITAL SIGNS:  Blood pressure 118/60, pulse 69, respirations 18, O2 sat 94% on room air. MUSCULOSKELETAL:  Gait is normal.  He has normal sensation in the right lower extremity.  Normal deep tendon reflexes.  Normal strength in hip flexor, knee extensor, and ankle dorsiflexor.  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  He has had a very good improvement after the nerve block.  No side effects.  As I discussed with patient, cannot predict the exact duration of response and also that I expect that with time his femoral neuropathy/neuralgia will improve.  PLAN:  I will see him back in 2 months, keep him on the current dose of Neurontin 600 t.i.d.  If he is still doing well at that time, we will initiate weaning at 400 mg.  If he has recurrence between now and then of his more severe pain, we will repeat the ultrasound-guided femoral nerve block.     [name], M.D. Electronically Signed    AEK/MedQ D:  04/17/2011 11:18:30  T:  04/17/2011 22:20:11  Job #:  [id number]  cc:   Veverly Fells. Excell Seltzer, MD [address] [place], [place] [address]  Otilio Connors. Gerri Spore, M.D. Fax: [phone]

## 2011-05-01 NOTE — Op Note (Signed)
NAME:  Elijah Watkins, Elijah Watkins NO.:  [id number]   MEDICAL RECORD NO.:  [id number]                   PATIENT TYPE:  AMB   LOCATION:  [facility]                                 FACILITY:  [facility]   PHYSICIAN:  Jene Every, M.D.                 DATE OF BIRTH:  04-Aug-1966   DATE OF PROCEDURE:  08/04/2004  DATE OF DISCHARGE:                                 OPERATIVE REPORT   PREOPERATIVE DIAGNOSES:  1. Chondral lesion medial femoral condyle.  2. Occult meniscus tear.  3. Status post medial collateral ligament tear.   POSTOPERATIVE DIAGNOSES:  1. Grade 3 lesion posterior medial femoral condyle.  2. Grade 3 lesion of the medial femoral condyle and the patella.  3. Pathologic plica.   PROCEDURES PERFORMED:  1. Exam under anesthesia.  2. Chondroplasty of medial femoral condyle.  3. Chondroplasty of patella.  4. Excision of pathologic plica.   SURGEON:  Javier Docker, M.D.   BRIEF HISTORY AND INDICATIONS:  This is a 45 year old, status post a knee  injury, sustained a grade 3 MCL tear that was treated conservatively.  The  patient had persistent pain to the knee.  Operative intervention was  indicated for diagnosis and treatment and exam under anesthesia to evaluate  the MCL.  Also had a grade 4 lesion to the medial femoral condyle that was  to be evaluated and a chondroplasty if noted to be pathologic as well as  rule out occult meniscus tear.  The patient also had a persistent patellar  bursitis that had been aspirated multiple times.  Risks and benefits  discussed including bleeding, infection, damage to neurovascular structures,  no change in symptoms, worsening symptoms, need for repeat debridement, etc.   TECHNIQUE:  The patient in the supine position.  After induction of adequate  general anesthesia and 1 g of Kefzol, the right lower extremity was prepped  and draped in the usual sterile fashion.  I examined the MCL.  There was no  instability to varus  valgus stressing in 0 or 30 degrees that was abnormal.  Next, a lateral parapatellar portal and a superomedial parapatellar portal  was fashioned with a #11 blade, the Ingress cannula atraumatically placed.  Irrigant was utilized to insufflate the joint.  Under direct visualization,  an 18 gauge needle was utilized to localize a medial parapatellar portal  which was then fashioned with a #11 blade, sparing the medial meniscus.  Inspection of the suprapatellar pouch.  There was a fairly large plica  draped in the superior and medial region over the medial femoral condyle  with flexion and extension.  There was some impingement over the condyle.  Grade 3 changes of the patella.  The gutters were unremarkable.  The ACL and  PCL were unremarkable in the medial compartment.  The meniscus was without  evidence of a tear and stable to probe palpation.  There were grade 3  lesions  on the medial femoral condyle and some grade 2 lesions on the tibial  plateau.  There was an unstable medial grade 3 lesion of the posterior  portion of the medial femoral condyle with the knee flexed beyond 90  degrees.  Collene Mares was introduced and utilized to perform a chondroplasty of  the medial femoral condyle to a stable base.  Again, reexamined the  meniscus.  No evidence of tear.  The ACL and PCL were unremarkable.  Lateral  compartment revealed normal femoral condyle.  Tibial plateau and meniscus  stable to probe palpation without evidence of tear.  The plica was excised  as well with a 4.2 cubic shaver.  I examined underneath the patella and  patellar fat pad.  No evidence of meniscal cyst.  Placed a needle in the  area of the prepatellar bursa to see if there was any connection to the  joint.  There was no extravasation of the joint fluid, only __________  bursal hematoma drained.  Next, the knee was copiously lavaged, reexamined  all compartments and no evidence of pathology.  Next, all instrumentation  was  removed.  The portals were closed with 4-0 nylon simple sutures.  Marcaine 0.25% with epinephrine was infiltrated in the joint.  Wound was  dressed sterilely.  He was awoken without difficulty and transported to the  recovery room in satisfactory condition.   The patient tolerated the procedure well, no complications.                                               Jene Every, M.D.    Cordelia Pen  D:  08/04/2004  T:  08/04/2004  Job:  [id number]

## 2011-05-01 NOTE — Op Note (Signed)
NAME:  Elijah Watkins, Elijah Watkins NO.:  [id number]   MEDICAL RECORD NO.:  [id number]          PATIENT TYPE:  AMB   LOCATION:  [facility]                         FACILITY:  [facility]   PHYSICIAN:  Jene Every, M.D.    DATE OF BIRTH:  [birth year]   DATE OF PROCEDURE:  12/05/2004  DATE OF DISCHARGE:                                 OPERATIVE REPORT   PREOPERATIVE DIAGNOSIS:  Prepatellar bursa and ganglion cyst.   POSTOPERATIVE DIAGNOSIS:  Prepatellar bursa and ganglion cyst.   PROCEDURE PERFORMED:  Excision of prepatellar ligament bursal cyst.   ANESTHESIA:  General.   SURGEON:  Javier Docker, M.D.   BRIEF HISTORY AND INDICATIONS:  This 45 year old has had a traumatic  prepatellar and mostly prepatellar ligament bursal cyst that has been  refractory to conservative treatment including multiple aspirations and  attempted corticosteroid injection.  The patient had persistent pain with  kneeling and with activities of daily living.  Due to that, he has requested  excision of the cyst.  I discussed risks and benefits, including bleeding,  infection, damage to vascular structures, recurrence of the cyst as well as  sending it to pathology for appropriate analysis.   TECHNIQUE:  The patient in supine position.  After induction of adequate  general anesthesia and 1 g of Kefzol, the right lower extremity was prepped  and draped in the usual sterile fashion.  A small amount of Marcaine with  epinephrine was infiltrated in the deep subcu tissue.  Incision was made  longitudinally over the patellar ligament.  Electrocautery was utilized to  achieve hemostasis with good bleeding to the subcutaneous tissues.  Noted  mainly was a ganglion cyst-like structure in the median subcutaneous tissue.  We meticulously dissected the cyst from the prepatellar ligament region.  This descended down to the patella ligament.  It measured approximately 1.5  cm x 1.5 cm.  It was fairly adherent distal.  Skin  flaps were developed on  either side of the cyst.  The rent in the cyst allowed a clear hemiacidrin-  type, watery fluid to be excised.  Excised the cyst and sent it to pathology  for appropriate analysis.  Fully evaluated the patellar ligament.  The  patellar ligament appeared to be fully intact.  I could see no evidence of  tear or see no extension of the cyst into the joint region.  Had a previous  arthroscopy with no extent noted on the arthroscopy to that cyst.  It was  all seen on MRI without evidence of that as well.  I did sent the cyst for  pathology.  Thinned some of the subcutaneous tissue out medially.  I then  copiously irrigated the wound and closed the subcutaneous tissue with 3-0  Vicryl simple sutures, and the subcutaneous tissue reapproximated with  Prolene.  Wound was reinforced with Steri-Strips.  Sterile dressing applied.  The patient was then awoken without difficulty and transported to the  recovery room in satisfactory condition.   The patient tolerated the procedure well with no complications.     Trey Paula  JB/MEDQ  D:  12/05/2004  T:  12/05/2004  Job:  [id number]

## 2011-05-26 ENCOUNTER — Other Ambulatory Visit: Payer: Self-pay | Admitting: Family Medicine

## 2011-05-26 ENCOUNTER — Ambulatory Visit
Admission: RE | Admit: 2011-05-26 | Discharge: 2011-05-26 | Disposition: A | Discharge status: Home / Self Care | Payer: 59 | Source: Ambulatory Visit | Attending: Family Medicine | Admitting: Family Medicine

## 2011-05-26 DIAGNOSIS — R062 Wheezing: Secondary | ICD-10-CM

## 2011-06-11 ENCOUNTER — Encounter: Payer: 59 | Attending: Physical Medicine & Rehabilitation

## 2011-06-11 ENCOUNTER — Encounter (HOSPITAL_BASED_OUTPATIENT_CLINIC_OR_DEPARTMENT_OTHER): Payer: 59 | Admitting: Physical Medicine & Rehabilitation

## 2011-06-11 DIAGNOSIS — G5790 Unspecified mononeuropathy of unspecified lower limb: Secondary | ICD-10-CM | POA: Insufficient documentation

## 2011-06-11 DIAGNOSIS — Z79899 Other long term (current) drug therapy: Secondary | ICD-10-CM | POA: Insufficient documentation

## 2011-06-11 DIAGNOSIS — G5720 Lesion of femoral nerve, unspecified lower limb: Secondary | ICD-10-CM

## 2011-06-11 DIAGNOSIS — G608 Other hereditary and idiopathic neuropathies: Secondary | ICD-10-CM | POA: Insufficient documentation

## 2011-06-11 DIAGNOSIS — M79609 Pain in unspecified limb: Secondary | ICD-10-CM | POA: Insufficient documentation

## 2011-06-12 NOTE — Procedures (Signed)
NAME:  Elijah Watkins, Elijah Watkins NO.:  [id number]  MEDICAL RECORD NO.:  [id number]           PATIENT TYPE:  LOCATION:                                 FACILITY:  PHYSICIAN:  [name], M.D.DATE OF BIRTH:  07-Jun-1966  DATE OF PROCEDURE:  06/11/2011 DATE OF DISCHARGE:                              OPERATIVE REPORT  PROCEDURE:  Right femoral nerve block under ultrasound guidance.  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  Pain is rated as severe with activity.  Work activities such as drumming is exacerbating his pain.  INTERVAL MEDICAL HISTORY:  On Effient, now that he underwent stent placement 1 week ago.  Informed consent was obtained after describing risks and benefits of the procedure with the patient.  These include bleeding, bruising, and infection.  We reiterated the additional risk of bleeding, hematoma formation due to his Effient.  He elects to proceed and has given written consent.  The patient was scanned in right inguinal area.  Femoral artery and femoral nerve identified.  Area was marked and prepped with Betadine, entered with 22-gauge 80-mm EchoBlock needle under direct visualization under ultrasound using Doppler ultrasound to identify the femoral artery.  The needle was directed to the area lateral to the left femoral artery under continuous ultrasound guidance.  Once target was reached, 1 mL of 40 mg/mL Depo-Medrol and 3 mL of 1% lidocaine injected.  The patient tolerated the procedure well.  No evidence of bleeding or bruising.  He was able to stand and walk without evidence of weakness in the right quad.  The patient tolerated the procedure well. Postprocedure instructions given.  We will see him back in 2 months for followup.  His wife is driving him home today.  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     [name], M.D. Electronically Signed    AEK/MEDQ   D:  06/11/2011 13:02:48  T:  06/11/2011 16:10:39  Job:  [id number]  cc:   Veverly Fells. Excell Seltzer, MD [address] [place], [place] [address]  Otilio Connors. Gerri Spore, M.D. Fax: [phone]

## 2011-06-19 ENCOUNTER — Ambulatory Visit: Payer: 59 | Admitting: Physical Medicine & Rehabilitation

## 2011-08-10 ENCOUNTER — Ambulatory Visit (HOSPITAL_BASED_OUTPATIENT_CLINIC_OR_DEPARTMENT_OTHER): Payer: 59 | Admitting: Physical Medicine & Rehabilitation

## 2011-08-10 ENCOUNTER — Encounter: Payer: 59 | Attending: Physical Medicine & Rehabilitation

## 2011-08-10 DIAGNOSIS — M79609 Pain in unspecified limb: Secondary | ICD-10-CM | POA: Insufficient documentation

## 2011-08-10 DIAGNOSIS — G5790 Unspecified mononeuropathy of unspecified lower limb: Secondary | ICD-10-CM | POA: Insufficient documentation

## 2011-08-10 DIAGNOSIS — G608 Other hereditary and idiopathic neuropathies: Secondary | ICD-10-CM | POA: Insufficient documentation

## 2011-08-10 DIAGNOSIS — Z79899 Other long term (current) drug therapy: Secondary | ICD-10-CM | POA: Insufficient documentation

## 2011-08-10 DIAGNOSIS — G5720 Lesion of femoral nerve, unspecified lower limb: Secondary | ICD-10-CM

## 2011-08-12 NOTE — Assessment & Plan Note (Signed)
REASON FOR VISIT:  Right groin and thigh pain.  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  He has undergone right femoral nerve block x2 under ultrasound guidance.  Last one was performed on June 11, 2011.  He has had complete relief of pain since that time.  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  He continues to take the Neurontin, but no longer uses the Lidoderm patch.  He had no complications despite being on Effient and has noted the block was performed under ultrasound guidance.  He did have numbness in that leg for 1 day.  His wife drove him home.  He reports no weakness or residual numbness in the right lower extremity.  His sleep is good.  Walking tolerance is 1 hour.  He climbs steps.  He drives.  He is self-employed as a Hospital doctor.  PHYSICAL EXAMINATION:  VITAL SIGNS:  Blood pressure 131/68, pulse 72, respirations 16, O2 sat 97% on room air. GENERAL:  No acute distress.  Mood and affect appropriate. EXTREMITIES:  His right lower extremity strength is normal.  He has no sensory deficits in the right side.  His right groin has no evidence of swelling.  No tenderness to palpation in the femoral triangle area.  IMPRESSION:  Femoral neuropathy, symptomatically improving.  As I told the patient, he may have spontaneous regeneration and improvement over the course of 18 months.  This would take him out to April 2013.  I could not predict for him how long this current block would last or whether he would need a repeat block.  I will see him back in 3 months unless he has difficulty before that time.  At that time, if he is still relatively pain free, we will start weaning the Neurontin.  Discussed with the patient, agrees with plan.     [name], M.D. Electronically Signed    AEK/MedQ D:  08/10/2011 09:37:34  T:  08/10/2011 11:06:20  Job #:  [id number]  cc:    Veverly Fells. Excell Seltzer, MD [address] [place], [place] [address]  Otilio Connors. Gerri Spore, M.D. Fax: [phone]

## 2011-11-10 ENCOUNTER — Ambulatory Visit: Payer: 59 | Admitting: Physical Medicine & Rehabilitation

## 2011-11-12 ENCOUNTER — Encounter: Payer: 59 | Attending: Physical Medicine & Rehabilitation

## 2011-11-12 ENCOUNTER — Encounter (HOSPITAL_BASED_OUTPATIENT_CLINIC_OR_DEPARTMENT_OTHER): Payer: 59 | Admitting: Physical Medicine & Rehabilitation

## 2011-11-12 DIAGNOSIS — G5720 Lesion of femoral nerve, unspecified lower limb: Secondary | ICD-10-CM

## 2011-11-12 DIAGNOSIS — G5790 Unspecified mononeuropathy of unspecified lower limb: Secondary | ICD-10-CM | POA: Insufficient documentation

## 2011-11-12 NOTE — Procedures (Signed)
NAME:  Elijah Watkins, Elijah Watkins NO.:  [id number]  MEDICAL RECORD NO.:  [id number]           PATIENT TYPE:  O  LOCATION:  [facility]                          FACILITY:  [facility]  PHYSICIAN:  [name], M.D.DATE OF BIRTH:  June 20, 1966  DATE OF PROCEDURE:  11/12/2011 DATE OF DISCHARGE:                              OPERATIVE REPORT  PROCEDURE:  Right femoral nerve block under ultrasound guidance.  INDICATION:  Femoral neuralgia, status post cardiac catheterization. Pain is only partially responsive to medication management and interfering with activity.  Informed consent was obtained after describing risks and benefits of the procedure.  He is on Effient and aware of increased bleeding risk. The area was scanned with ultrasound.  Femoral artery identified.  Area was marked and prepped with Betadine, alcohol, 25-gauge inch and half needle was used to anesthetize skin and subcu tissue with 1% lidocaine x2 mL.  Then, a 22-gauge, 10 cm echo block needle was inserted under direct ultrasound visualization in the long axis.  The femoral artery and femoral nerve were identified.  The ilioinguinal fascia was pierced and paraesthesias were elicited.  1 mL of 40 mg/mL Depo-Medrol and 4 mL of 0.25% Marcaine injected.  The patient tolerated the procedure well. Postprocedure instructions given.     [name], M.D. Electronically Signed    AEK/MEDQ  D:  11/12/2011 09:60:45  T:  11/12/2011 11:32:48  Job:  [id number]  cc:   Dr. Excell Seltzer

## 2011-12-15 HISTORY — PX: CARPAL TUNNEL RELEASE: SHX101

## 2012-02-24 ENCOUNTER — Encounter (INDEPENDENT_AMBULATORY_CARE_PROVIDER_SITE_OTHER): Payer: Self-pay | Admitting: General Surgery

## 2012-02-24 ENCOUNTER — Ambulatory Visit (INDEPENDENT_AMBULATORY_CARE_PROVIDER_SITE_OTHER): Payer: 59 | Admitting: General Surgery

## 2012-02-24 ENCOUNTER — Emergency Department (HOSPITAL_COMMUNITY)
Admission: EM | Admit: 2012-02-24 | Discharge: 2012-02-24 | Disposition: A | Discharge status: Home / Self Care | Payer: 59 | Attending: General Surgery | Admitting: General Surgery

## 2012-02-24 ENCOUNTER — Telehealth (INDEPENDENT_AMBULATORY_CARE_PROVIDER_SITE_OTHER): Payer: Self-pay | Admitting: General Surgery

## 2012-02-24 ENCOUNTER — Encounter (HOSPITAL_COMMUNITY): Payer: Self-pay | Admitting: Emergency Medicine

## 2012-02-24 VITALS — BP 134/82 | HR 70 | Temp 97.8°F | Resp 16 | Ht 71.0 in | Wt 264.6 lb

## 2012-02-24 DIAGNOSIS — E119 Type 2 diabetes mellitus without complications: Secondary | ICD-10-CM

## 2012-02-24 DIAGNOSIS — I10 Essential (primary) hypertension: Secondary | ICD-10-CM

## 2012-02-24 DIAGNOSIS — M109 Gout, unspecified: Secondary | ICD-10-CM | POA: Insufficient documentation

## 2012-02-24 DIAGNOSIS — Z7901 Long term (current) use of anticoagulants: Secondary | ICD-10-CM | POA: Insufficient documentation

## 2012-02-24 DIAGNOSIS — IMO0002 Reserved for concepts with insufficient information to code with codable children: Secondary | ICD-10-CM | POA: Insufficient documentation

## 2012-02-24 DIAGNOSIS — Y838 Other surgical procedures as the cause of abnormal reaction of the patient, or of later complication, without mention of misadventure at the time of the procedure: Secondary | ICD-10-CM | POA: Insufficient documentation

## 2012-02-24 DIAGNOSIS — F32A Depression, unspecified: Secondary | ICD-10-CM | POA: Insufficient documentation

## 2012-02-24 DIAGNOSIS — I2510 Atherosclerotic heart disease of native coronary artery without angina pectoris: Secondary | ICD-10-CM | POA: Insufficient documentation

## 2012-02-24 DIAGNOSIS — E669 Obesity, unspecified: Secondary | ICD-10-CM

## 2012-02-24 DIAGNOSIS — E1129 Type 2 diabetes mellitus with other diabetic kidney complication: Secondary | ICD-10-CM | POA: Insufficient documentation

## 2012-02-24 DIAGNOSIS — L0501 Pilonidal cyst with abscess: Secondary | ICD-10-CM | POA: Insufficient documentation

## 2012-02-24 DIAGNOSIS — I252 Old myocardial infarction: Secondary | ICD-10-CM | POA: Insufficient documentation

## 2012-02-24 DIAGNOSIS — F329 Major depressive disorder, single episode, unspecified: Secondary | ICD-10-CM | POA: Insufficient documentation

## 2012-02-24 MED ORDER — SILVER NITRATE-POT NITRATE 75-25 % EX MISC
CUTANEOUS | Status: AC
  Filled 2012-02-24: qty 2

## 2012-02-24 NOTE — Progress Notes (Signed)
Patient ID: Elijah Watkins, male   DOB: Sep 29, 1966, 46 y.o.   MRN: [id number]  Chief Complaint  Patient presents with  . Follow-up    Peri rectal abscess    HPI Elijah Watkins is a 46 y.o. male.  He is referred by Dr. Darrow Bussing at [facility] [place] for management of an abscess in the gluteal area.  This patient has diabetes mellitus, chronic anxiety and depression, hypertension, coronary or disease, myocardial infarction in the past, neuropathy, kidney stones. He is a Musician.  He states that 3 months ago he developed a painful swollen area at the top of his intergluteal cleft. He has never had this happen before. He denies any gluteal or rectal surgery. He's been treated with antibiotics 3 times for this. It has never opened up and drained but it it does get hard and tender and then subsides. He saw Dr. Docia Chuck yesterday and was placed on Bactrim and was referred to our office. We are happy to see him in the urgent office today. HPI  Past Medical History  Diagnosis Date  . Perirectal abscess   . Heart attack     2 stents placed  . Rectal pain   . Diabetes mellitus     type 2  . Gout   . Hyperlipidemia   . Hypertension   . CAD (coronary artery disease)   . Overweight   . Nephrolithiasis     Past Surgical History  Procedure Date  . Coronary angioplasty with stent placement   . Kidney stone surgery 1988 - 1998    3 procedures done  . Wrist surgery 02/1997    right - due to tendonitis  . Knee cartilage surgery 10/2003, 11/2003    Also had bursa removed    History reviewed. No pertinent family history.  Social History History  Substance Use Topics  . Smoking status: Former Smoker    Quit date: 02/24/1984  . Smokeless tobacco: Never Used  . Alcohol Use: No     rare    No Known Allergies  Current Outpatient Prescriptions  Medication Sig Dispense Refill  . KOMBIGLYZE XR 2.04-999 MG TB24       . sulfamethoxazole-trimethoprim (BACTRIM DS)  800-160 MG per tablet Every 12 hours.      . ALPRAZolam (XANAX) 1 MG tablet Take 1 QID PRN by mouth      . aspirin 81 MG tablet Take 81 mg by mouth daily.      . fish oil-omega-3 fatty acids 1000 MG capsule Take 1 g by mouth daily.      Marland Kitchen gabapentin (NEURONTIN) 600 MG tablet Take 600 mg by mouth 3 (three) times daily.      Marland Kitchen gemfibrozil (LOPID) 600 MG tablet Take 600 mg by mouth 2 (two) times daily before a meal.      . insulin NPH (HUMULIN N,NOVOLIN N) 100 UNIT/ML injection Inject 60 Units into the skin 2 (two) times daily. 6 am and 6pm      . lidocaine (LIDODERM) 5 % Place 1 patch onto the skin daily. Remove & Discard patch within 12 hours or as directed by MD      . lisinopril (PRINIVIL,ZESTRIL) 10 MG tablet Take 10 mg by mouth daily.      . metoprolol (LOPRESSOR) 50 MG tablet Take 50 mg by mouth 2 (two) times daily.      . nitroGLYCERIN (NITROSTAT) 0.4 MG SL tablet Place 0.4 mg under the tongue every 5 (five) minutes  as needed.      . prasugrel (EFFIENT) 10 MG TABS Take 10 mg by mouth daily.      . pravastatin (PRAVACHOL) 40 MG tablet Take 1 1/2 tablet (60mg ) by mouth daily      . ranolazine (RANEXA) 500 MG 12 hr tablet Take 500 mg by mouth 2 (two) times daily.      . sitaGLIPtan-metformin (JANUMET) 50-500 MG per tablet Take 1 tablet by mouth 2 (two) times daily with a meal.      . vitamin C (ASCORBIC ACID) 500 MG tablet Take 500 mg by mouth daily.        Review of Systems Review of Systems  Constitutional: Negative for fever, chills and unexpected weight change.  HENT: Negative for hearing loss, congestion, sore throat, trouble swallowing and voice change.   Eyes: Negative for visual disturbance.  Respiratory: Negative for cough and wheezing.   Cardiovascular: Negative for chest pain, palpitations and leg swelling.  Gastrointestinal: Negative for nausea, vomiting, abdominal pain, diarrhea, constipation, blood in stool, abdominal distention, anal bleeding and rectal pain.    Genitourinary: Negative for hematuria and difficulty urinating.  Musculoskeletal: Negative for arthralgias.  Skin: Negative for rash and wound.  Neurological: Negative for seizures, syncope, weakness and headaches.  Hematological: Negative for adenopathy. Does not bruise/bleed easily.  Psychiatric/Behavioral: Negative for confusion.    Blood pressure 134/82, pulse 70, temperature 97.8 F (36.6 C), temperature source Temporal, resp. rate 16, height 5\' 11"  (1.803 m), weight 264 lb 9.6 oz (120.022 kg).  Physical Exam Physical Exam  Constitutional: He appears well-developed and well-nourished. No distress.  Cardiovascular: Normal rate, normal heart sounds and intact distal pulses.   Pulmonary/Chest: Effort normal and breath sounds normal. No respiratory distress.  Abdominal: Soft. Bowel sounds are normal. He exhibits no distension. There is no tenderness.  Skin: Skin is warm and dry. No rash noted. He is not diaphoretic. There is erythema. No pallor.       Numerous tattoos throughout. 2 cm tender, erythematous swelling in the pilonidal region just to the left of the midline. Not very fluctuant but obviously infected. No other scars. Perianal and perirectal area is normal.  Psychiatric: He has a normal mood and affect. His behavior is normal. Judgment and thought content normal.    Data Reviewed I reviewed Dr. Glendale Chard office notes.  Assessment    Small, recurrent pilonidal abscess.  Procedure note. Patient placed prone. Informed consent given. Procedure and risks discussed. Following Betadine prep this area was anesthetized with 1% Xylocaine. A 1.5 cm incision was made. A small abscess was drained. Minimal bleeding. Wound packed with iodoform gauze. Tolerated very well. Comfortable at end of procedure.  Coronary disease status post myocardial-infarction  Diabetes mellitus  Anxiety and depression  Neuropathy  Hypertension  Obesity.    Plan    Wound care discussed. Written  instructions given. He will continue the antibiotics until they are completely gone.  Return to see me in approximately 10 days  Since this is the first time that this has required a procedure, I told him that we would try to see if this would heal primarily. If he develops a recurrent abscess in the future he may need a more formal pilonidal cystectomy.       Angelia Mould. Derrell Lolling, M.D., [facility] General and Minimally invasive Surgery Breast and Colorectal Surgery Office:   [phone] Pager:   [phone]  02/24/2012, 5:18 PM

## 2012-02-24 NOTE — ED Notes (Signed)
Pt arrived he had been in surgery office t Dr. Caralyn Guile and his partner had worked on a cyst in his anal area.  It started to bleed when he went home and would not stop no matter what they tried so he came to the ER via EMS nd Dr. Carolynne Edouard was here so he took care of the bleed using silver nitrate sticks and 4X4's and was able to get the bleeding stopped.  He put 4X4's in the wound and this nurse covered with 4X4's and taped to secure. Wife came back and Dr. Carolynne Edouard showed her how to keep wound packed cleaned and dry. And instructred him to call the office tomorrow and see them and notify of any further problems.  No instrctions were given by Dr Carolynne Edouard and patient was DC'd home with wife and perscription.  All instructions were repeated to this nurse by the patient and he verbalized understanding and instructed pt about meds and he understood that also. Wife also verb understanding of treatment of wound care and prescription

## 2012-02-24 NOTE — Patient Instructions (Signed)
You have a small, recurrent pilonidal abscess. We performed incision and drainage today and put some packing in the open wound.  Take all of the antibiotics until they are completely gone.  Take a warm sitz bath or warm tub bath 3 times a day and cover the open wound with dry gauze.  Remove the packing tomorrow night and do not repack it.  Return to see Dr. Derrell Lolling in approximately 10 days.

## 2012-02-24 NOTE — ED Notes (Signed)
This nurse was also in with the MD the entire time

## 2012-02-24 NOTE — Consult Note (Signed)
Reason for Consult:bleeding Referring Physician: none  ZAIDIN BLYDEN is an 46 y.o. male.  HPI: Had I+D of pilonidal abscess earlier today in our office. He is on blood thinners. Started bleeding after he left office  Past Medical History  Diagnosis Date  . Perirectal abscess   . Heart attack     2 stents placed  . Rectal pain   . Diabetes mellitus     type 2  . Gout   . Hyperlipidemia   . Hypertension   . CAD (coronary artery disease)   . Overweight   . Nephrolithiasis     Past Surgical History  Procedure Date  . Coronary angioplasty with stent placement   . Kidney stone surgery 1988 - 1998    3 procedures done  . Wrist surgery 02/1997    right - due to tendonitis  . Knee cartilage surgery 10/2003, 11/2003    Also had bursa removed    No family history on file.  Social History:  reports that he quit smoking about 28 years ago. He has never used smokeless tobacco. He reports that he does not drink alcohol or use illicit drugs.  Allergies: No Known Allergies  Medications: I have reviewed the patient's current medications.  No results found for this or any previous visit (from the past 48 hour(s)).  No results found.  Review of Systems  Constitutional: Negative.   HENT: Negative.   Eyes: Negative.   Respiratory: Negative.   Cardiovascular: Negative.   Gastrointestinal: Negative.   Genitourinary: Negative.   Musculoskeletal: Negative.   Skin: Negative.   Neurological: Negative.   Endo/Heme/Allergies: Bruises/bleeds easily.  Psychiatric/Behavioral: Negative.    There were no vitals taken for this visit. Physical Exam  Constitutional: He is oriented to person, place, and time. He appears well-developed and well-nourished.  HENT:  Head: Normocephalic and atraumatic.  Eyes: Conjunctivae and EOM are normal. Pupils are equal, round, and reactive to light.  Neck: Normal range of motion. Neck supple.  Cardiovascular: Normal rate, regular rhythm and normal  heart sounds.   Respiratory: Effort normal and breath sounds normal.  GI: Soft. Bowel sounds are normal.  Genitourinary:       Bleeding from I+D site in gluteal cleft  Musculoskeletal: Normal range of motion.  Neurological: He is alert and oriented to person, place, and time.  Skin: Skin is warm and dry.  Psychiatric: He has a normal mood and affect. His behavior is normal.    Assessment/Plan: Applied silver nitrate to wound several times and held pressure until wound was dry. Repacked. He tolerated it well. We will follow him up closely in clinic  TOTH III,Sharmane Dame S 02/24/2012, 8:25 PM

## 2012-02-26 ENCOUNTER — Encounter (INDEPENDENT_AMBULATORY_CARE_PROVIDER_SITE_OTHER): Payer: 59 | Admitting: Surgery

## 2012-02-26 ENCOUNTER — Encounter (INDEPENDENT_AMBULATORY_CARE_PROVIDER_SITE_OTHER): Payer: 59 | Admitting: General Surgery

## 2012-02-26 ENCOUNTER — Ambulatory Visit (INDEPENDENT_AMBULATORY_CARE_PROVIDER_SITE_OTHER): Payer: 59 | Admitting: Surgery

## 2012-02-26 ENCOUNTER — Encounter (INDEPENDENT_AMBULATORY_CARE_PROVIDER_SITE_OTHER): Payer: Self-pay | Admitting: Surgery

## 2012-02-26 VITALS — BP 156/90 | HR 70 | Temp 97.8°F | Resp 18 | Ht 71.0 in | Wt 259.4 lb

## 2012-02-26 DIAGNOSIS — L0501 Pilonidal cyst with abscess: Secondary | ICD-10-CM

## 2012-02-26 NOTE — Progress Notes (Signed)
[facility]  Ovidio Kin, MD,  FACS [address] [place]  [address] [place], [place]    [address] Phone:  [phone] FAX:  [phone]   Re:   Elijah Watkins DOB:   07/25/1966 MRN:   [id number]  Urgent Office  ASSESSMENT AND PLAN: 1.  Pilonidal abscess - drained 02/24/2012 - H. Marjo Bicker to ER that night for bleeding that night - PJ Carolynne Edouard  Wife has changed the bandage a couple of times.  Bleeding better.  Wound okay.  To start stiz baths - TID  See Dr. Derrell Lolling in 7 to 14 days.  2. Coronary disease status post myocardial-infarction.  On aspirin/Effient.  This was probably part of the cause of the bleed. 3. Diabetes mellitus.  On insulin.  Blood sugars have been okay. 4. Anxiety and depression  5. Neuropathy  6. Hypertension  7. Obesity.  8.  Lots of tattoos.   HISTORY OF PRESENT ILLNESS: Chief Complaint  Patient presents with  . Follow-up    Pilonidal cyst    Elijah Watkins is a 46 y.o. (DOB: 1966/11/24)  white male who is a patient of Elie Confer, MD, MD and comes to me today for follow up for pilonidal abscess.  Doing better than the other day.  Wife with him.  He works as a Surveyor, minerals.  No further bleeding.  PHYSICAL EXAM: BP 156/90  Pulse 70  Temp(Src) 97.8 F (36.6 C) (Temporal)  Resp 18  Ht 5\' 11"  (1.803 m)  Wt 259 lb 6.4 oz (117.663 kg)  BMI 36.18 kg/m2  Buttocks:  3 cm wound okay.  I cleaned out some old blood, but overall the wound looks good.  DATA REVIEWED: Old chart.  Ovidio Kin, MD, FACS Office:  [phone]

## 2012-02-26 NOTE — Patient Instructions (Signed)
1.  Start soaking wound in bath tub - three times per day - tomorrow AM.  2.  To see Dr. Derrell Lolling back in 7 to 14 days.

## 2012-03-03 ENCOUNTER — Encounter (INDEPENDENT_AMBULATORY_CARE_PROVIDER_SITE_OTHER): Payer: Self-pay | Admitting: General Surgery

## 2012-03-03 ENCOUNTER — Ambulatory Visit (INDEPENDENT_AMBULATORY_CARE_PROVIDER_SITE_OTHER): Payer: 59 | Admitting: General Surgery

## 2012-03-03 VITALS — BP 146/80 | HR 105 | Temp 96.9°F | Ht 71.0 in | Wt 256.1 lb

## 2012-03-03 DIAGNOSIS — L0501 Pilonidal cyst with abscess: Secondary | ICD-10-CM

## 2012-03-03 NOTE — Progress Notes (Signed)
Subjective:     Patient ID: GARLAND HINCAPIE, male   DOB: [birth year], 46 y.o.   MRN: [id number]  HPI Status post incision and drainage of a small pilonidal abscess on March 13. This was his first episode. His wife has been having him take care of the wound. He says the pain is less and the drainage is less.  Review of Systems     Objective:   Physical Exam Patient is in no distress.  Wound is about 1.5 cm in length. A little bit of brownish exudate on the surface appeared healthy granulation tissue beneath. No undrained periods.  Repacked gently.    Assessment:     Small, initial pilonidal abscess, healing by secondary intention following incision and drainage    Plan:     Wound care discussed discussed. Return to see me if it does not completely heal in 4 weeks. Otherwise see me p.r.n. further problems.   Angelia Mould. Derrell Lolling, M.D., [facility] General and Minimally invasive Surgery Breast and Colorectal Surgery Office:   [phone] Pager:   [phone]

## 2012-03-03 NOTE — Patient Instructions (Signed)
The infection in your pilonidal abscess appears to be coming under control.  Take warm tub baths twice a day. Have your wife gently pack the wound with the gauze that I gave you.  The wound should completely heal in 3 or 4 weeks. If it has not completely healed in 4 weeks return to see me.

## 2012-04-04 ENCOUNTER — Ambulatory Visit: Payer: 59 | Admitting: Physical Medicine & Rehabilitation

## 2012-04-26 ENCOUNTER — Ambulatory Visit (INDEPENDENT_AMBULATORY_CARE_PROVIDER_SITE_OTHER): Payer: 59 | Admitting: General Surgery

## 2012-04-26 ENCOUNTER — Encounter (INDEPENDENT_AMBULATORY_CARE_PROVIDER_SITE_OTHER): Payer: Self-pay | Admitting: General Surgery

## 2012-04-26 DIAGNOSIS — L0591 Pilonidal cyst without abscess: Secondary | ICD-10-CM

## 2012-04-26 NOTE — Progress Notes (Signed)
Subjective:     Patient ID: Elijah Watkins, male   DOB: 04/19/1966, 46 y.o.   MRN: [id number]  HPI The patient is a 46 year old male with a history of pilonidal cyst incision and drainage approximately one month ago by Dr. Derrell Lolling. The patient reported that it took several weeks for it to completely heal using packing. It had completely healed however over the weekend he began having pain over the area and on Sunday the area opened and drained. He went to see Dr. Gerri Spore. He was started on antibiotics but is unsure of the name. He was advised to followup with our group for surgical intervention. The patient denies fevers or chills but has had significant pain over the area. He reports that the majority of the drainage has actually been bloody in nature. The patient is on Effient secondary to cardiac stents and has been advised he is unable to hold this medication for interventions. Review of Systems  All other systems reviewed and are negative.       Objective:   Physical Exam  Constitutional: He appears well-developed and well-nourished. No distress.  Skin: No erythema.  There is a small skin opening approximately 1-1/2-2 cm along the left medial buttocks. The area is probed with a Q-tip and found to be approximately 1 cm deep with only bloody drainage. There is no significant erythema surrounding or purulence noted. Bleeding is noted after probing and pressure is held over the area. The area is then packed with iodoform and a dry dressing is applied.     Assessment:     Recurrent pilonidal cyst    Plan:     The patient was discussed with Dr. Gerrit Friends.  Given that the patient has had a recurrent pilonidal cyst and is also on anticoagulation secondary to cardiac stents we recommend antibiotics and wound care with followup in one week with the surgeon to discuss surgical removal of his pilonidal cyst. This was discussed with the patient who expresses understanding and agrees. His wife is  instructed to remove a small amount of packing each day and to keep covered with a dry dressing. He will followup in 5-7 days with Dr. Carolynne Edouard. He will continue his antibiotics as prescribed. He knows to call with questions or concerns.  Landis Martins 04/26/2012

## 2012-04-26 NOTE — Patient Instructions (Signed)
Patient is advised to pull a small amount of packing out each day and dressed with a dry gauze. He will continue his current antibiotics. Given the patient is on Effient ultimately he would need the operating room for ultimate removal of his pilonidal cyst. We will schedule him to see Dr. Carolynne Edouard next week to discuss possible surgical removal. He knows to call with questions or concerns.

## 2012-04-27 ENCOUNTER — Telehealth (INDEPENDENT_AMBULATORY_CARE_PROVIDER_SITE_OTHER): Payer: Self-pay | Admitting: General Surgery

## 2012-04-27 NOTE — Telephone Encounter (Signed)
Patient called back to let us know he is on doxycycline. I told him that was good and to remain on this.

## 2012-04-28 ENCOUNTER — Ambulatory Visit (INDEPENDENT_AMBULATORY_CARE_PROVIDER_SITE_OTHER): Payer: 59 | Admitting: General Surgery

## 2012-05-06 ENCOUNTER — Ambulatory Visit (INDEPENDENT_AMBULATORY_CARE_PROVIDER_SITE_OTHER): Payer: 59 | Admitting: General Surgery

## 2012-05-06 ENCOUNTER — Encounter (INDEPENDENT_AMBULATORY_CARE_PROVIDER_SITE_OTHER): Payer: Self-pay | Admitting: General Surgery

## 2012-05-06 VITALS — BP 142/81 | HR 77 | Temp 97.2°F | Ht 71.0 in | Wt 258.6 lb

## 2012-05-06 DIAGNOSIS — L0501 Pilonidal cyst with abscess: Secondary | ICD-10-CM

## 2012-05-06 NOTE — Progress Notes (Signed)
Subjective:     Patient ID: Elijah Watkins, male   DOB: 11/16/1966, 46 y.o.   MRN: [id number]  HPI The patient is a 46 her white male who has a history of a few months ago of an incision and drainage of a pilonidal abscess. His postoperative course was complicated by bleeding because of a blood thinner he is on for a cardiac stent. Initially the area seemed to heal well but has since flared up again. He has had intermittent drainage from the area. The area is tender.  Review of Systems  Constitutional: Negative.   HENT: Negative.   Eyes: Negative.   Respiratory: Negative.   Cardiovascular: Negative.   Gastrointestinal: Negative.   Genitourinary: Negative.   Musculoskeletal: Negative.   Skin: Negative.   Neurological: Negative.   Hematological: Negative.   Psychiatric/Behavioral: Negative.        Objective:   Physical Exam  Constitutional: He is oriented to person, place, and time. He appears well-developed and well-nourished.  HENT:  Head: Normocephalic and atraumatic.  Eyes: Conjunctivae and EOM are normal. Pupils are equal, round, and reactive to light.  Neck: Normal range of motion. Neck supple.  Cardiovascular: Normal rate, regular rhythm and normal heart sounds.   Pulmonary/Chest: Effort normal and breath sounds normal.  Abdominal: Soft. Bowel sounds are normal.  Genitourinary:       He has a recurrent indurated area in the gluteal cleft with a small amount of drainage. There is minimal redness.  Musculoskeletal: Normal range of motion.  Neurological: He is alert and oriented to person, place, and time.  Skin: Skin is warm and dry.  Psychiatric: He has a normal mood and affect. His behavior is normal.       Assessment:     Recurrent pilonidal abscess    Plan:     At this point I think the area needs to be re incised. I think he would be a good candidate for use of a cell. He is also at increased risk of bleeding complication given his blood thinners that he is  unable to stop because of his cardiac stent. I have discussed all this with him including the risks and benefits of surgery as well as some of the technical aspects and he understands and wishes to proceed.      

## 2012-05-16 ENCOUNTER — Encounter (HOSPITAL_BASED_OUTPATIENT_CLINIC_OR_DEPARTMENT_OTHER): Payer: Self-pay | Admitting: *Deleted

## 2012-05-16 NOTE — Pre-Procedure Instructions (Addendum)
To come for [facility] Cardiology note and EKG req. from [facility] Cardiology, [facility] [phone]

## 2012-05-16 NOTE — Pre-Procedure Instructions (Signed)
Cardiology note reviewed by Dr. Noreene Larsson - need definitive answer from cardiologist if pt. is safe to have surgery without further cardiac workup - spoke with French Ana at [facility], she will have Dr. Deedra Ehrich nurse call us.

## 2012-05-17 ENCOUNTER — Ambulatory Visit
Admission: RE | Admit: 2012-05-17 | Discharge: 2012-05-17 | Disposition: A | Discharge status: Home / Self Care | Payer: 59 | Source: Ambulatory Visit | Attending: Anesthesiology | Admitting: Anesthesiology

## 2012-05-17 ENCOUNTER — Encounter (HOSPITAL_BASED_OUTPATIENT_CLINIC_OR_DEPARTMENT_OTHER)
Admission: RE | Admit: 2012-05-17 | Discharge: 2012-05-17 | Disposition: A | Discharge status: Home / Self Care | Payer: 59 | Source: Ambulatory Visit | Attending: General Surgery | Admitting: General Surgery

## 2012-05-17 LAB — BASIC METABOLIC PANEL
BUN: 19 mg/dL (ref 6–23)
Creatinine, Ser: 1.34 mg/dL (ref 0.50–1.35)
GFR calc Af Amer: 72 mL/min — ABNORMAL LOW (ref >90)
GFR calc non Af Amer: 62 mL/min — ABNORMAL LOW (ref >90)
Glucose, Bld: 124 mg/dL — ABNORMAL HIGH (ref 70–99)
Potassium: 4.9 mEq/L (ref 3.5–5.1)

## 2012-05-17 NOTE — Pre-Procedure Instructions (Signed)
Spoke with Jolayne Haines, nurse at [facility] Cardiology; she states that Dr. Wille Glaser feels that pt. is a low cardiac risk for this surgery, and documentation of same has been received/placed in pt's chart.

## 2012-05-18 ENCOUNTER — Ambulatory Visit: Admit: 2012-05-18 | Payer: Self-pay | Admitting: General Surgery

## 2012-05-18 SURGERY — EXCISION, LESION, BREAST
Anesthesia: General | Laterality: Left

## 2012-05-19 ENCOUNTER — Encounter (HOSPITAL_BASED_OUTPATIENT_CLINIC_OR_DEPARTMENT_OTHER)
Admission: RE | Disposition: A | Discharge status: Home / Self Care | Payer: Self-pay | Source: Ambulatory Visit | Attending: General Surgery

## 2012-05-19 ENCOUNTER — Encounter (HOSPITAL_BASED_OUTPATIENT_CLINIC_OR_DEPARTMENT_OTHER): Payer: Self-pay | Admitting: Anesthesiology

## 2012-05-19 ENCOUNTER — Ambulatory Visit (HOSPITAL_BASED_OUTPATIENT_CLINIC_OR_DEPARTMENT_OTHER)
Admission: RE | Admit: 2012-05-19 | Discharge: 2012-05-19 | Disposition: A | Discharge status: Home / Self Care | Payer: 59 | Source: Ambulatory Visit | Attending: General Surgery | Admitting: General Surgery

## 2012-05-19 ENCOUNTER — Encounter (HOSPITAL_BASED_OUTPATIENT_CLINIC_OR_DEPARTMENT_OTHER): Payer: Self-pay | Admitting: *Deleted

## 2012-05-19 ENCOUNTER — Ambulatory Visit (HOSPITAL_BASED_OUTPATIENT_CLINIC_OR_DEPARTMENT_OTHER): Payer: 59 | Admitting: Anesthesiology

## 2012-05-19 DIAGNOSIS — Z9861 Coronary angioplasty status: Secondary | ICD-10-CM | POA: Insufficient documentation

## 2012-05-19 DIAGNOSIS — L0501 Pilonidal cyst with abscess: Secondary | ICD-10-CM

## 2012-05-19 DIAGNOSIS — Z01812 Encounter for preprocedural laboratory examination: Secondary | ICD-10-CM | POA: Insufficient documentation

## 2012-05-19 DIAGNOSIS — Z01810 Encounter for preprocedural cardiovascular examination: Secondary | ICD-10-CM | POA: Insufficient documentation

## 2012-05-19 DIAGNOSIS — E119 Type 2 diabetes mellitus without complications: Secondary | ICD-10-CM | POA: Insufficient documentation

## 2012-05-19 DIAGNOSIS — I10 Essential (primary) hypertension: Secondary | ICD-10-CM | POA: Insufficient documentation

## 2012-05-19 DIAGNOSIS — I252 Old myocardial infarction: Secondary | ICD-10-CM | POA: Insufficient documentation

## 2012-05-19 DIAGNOSIS — L0591 Pilonidal cyst without abscess: Secondary | ICD-10-CM

## 2012-05-19 DIAGNOSIS — I2510 Atherosclerotic heart disease of native coronary artery without angina pectoris: Secondary | ICD-10-CM | POA: Insufficient documentation

## 2012-05-19 HISTORY — DX: Irritability and anger: R45.4

## 2012-05-19 HISTORY — DX: Personal history of other venous thrombosis and embolism: Z86.718

## 2012-05-19 HISTORY — DX: Personal history of traumatic brain injury: Z87.820

## 2012-05-19 HISTORY — DX: Acute myocardial infarction, unspecified: I21.9

## 2012-05-19 HISTORY — DX: Presence of coronary angioplasty implant and graft: Z95.5

## 2012-05-19 HISTORY — DX: Dental restoration status: Z98.811

## 2012-05-19 HISTORY — DX: Other specified postprocedural states: Z98.890

## 2012-05-19 HISTORY — PX: PILONIDAL CYST DRAINAGE: SHX743

## 2012-05-19 HISTORY — DX: Nausea with vomiting, unspecified: R11.2

## 2012-05-19 HISTORY — DX: Personal history of peptic ulcer disease: Z87.11

## 2012-05-19 HISTORY — DX: Other seasonal allergic rhinitis: J30.2

## 2012-05-19 SURGERY — EXCISION, PILONIDAL CYST
Anesthesia: General | Site: Coccyx | Wound class: Contaminated

## 2012-05-19 MED ORDER — CHLORHEXIDINE GLUCONATE 4 % EX LIQD: 1.0000 "application " | Freq: Once | CUTANEOUS | Status: DC

## 2012-05-19 MED ORDER — GLYCOPYRROLATE 0.2 MG/ML IJ SOLN
INTRAMUSCULAR | Status: DC | PRN
  Administered 2012-05-19: 0.1 mg via INTRAVENOUS

## 2012-05-19 MED ORDER — ACETAMINOPHEN 10 MG/ML IV SOLN
1000.0000 mg | Freq: Once | INTRAVENOUS | Status: AC
  Administered 2012-05-19: 1000 mg via INTRAVENOUS

## 2012-05-19 MED ORDER — ONDANSETRON HCL 4 MG/2ML IJ SOLN
INTRAMUSCULAR | Status: DC | PRN
  Administered 2012-05-19: 4 mg via INTRAVENOUS

## 2012-05-19 MED ORDER — EPHEDRINE SULFATE 50 MG/ML IJ SOLN
INTRAMUSCULAR | Status: DC | PRN
  Administered 2012-05-19: 10 mg via INTRAVENOUS

## 2012-05-19 MED ORDER — HYDROMORPHONE HCL PF 1 MG/ML IJ SOLN: 0.2500 mg | INTRAMUSCULAR | Status: DC | PRN

## 2012-05-19 MED ORDER — PROPOFOL 10 MG/ML IV EMUL
INTRAVENOUS | Status: DC | PRN
  Administered 2012-05-19: 300 mg via INTRAVENOUS

## 2012-05-19 MED ORDER — SUCCINYLCHOLINE CHLORIDE 20 MG/ML IJ SOLN
INTRAMUSCULAR | Status: DC | PRN
  Administered 2012-05-19: 120 mg via INTRAVENOUS

## 2012-05-19 MED ORDER — LIDOCAINE HCL (CARDIAC) 20 MG/ML IV SOLN
INTRAVENOUS | Status: DC | PRN
  Administered 2012-05-19: 100 mg via INTRAVENOUS

## 2012-05-19 MED ORDER — METOCLOPRAMIDE HCL 5 MG/ML IJ SOLN
INTRAMUSCULAR | Status: DC | PRN
  Administered 2012-05-19: 10 mg via INTRAVENOUS

## 2012-05-19 MED ORDER — LACTATED RINGERS IV SOLN
INTRAVENOUS | Status: DC
  Administered 2012-05-19 (×2): via INTRAVENOUS

## 2012-05-19 MED ORDER — DEXAMETHASONE SODIUM PHOSPHATE 4 MG/ML IJ SOLN
INTRAMUSCULAR | Status: DC | PRN
  Administered 2012-05-19: 4 mg via INTRAVENOUS

## 2012-05-19 MED ORDER — CEFAZOLIN SODIUM-DEXTROSE 2-3 GM-% IV SOLR
2.0000 g | INTRAVENOUS | Status: AC
  Administered 2012-05-19: 2 g via INTRAVENOUS

## 2012-05-19 MED ORDER — HYDROCODONE-ACETAMINOPHEN 5-325 MG PO TABS: 1.0000 | ORAL_TABLET | ORAL | Status: AC | PRN

## 2012-05-19 MED ORDER — OXYCODONE HCL 5 MG PO TABS
5.0000 mg | ORAL_TABLET | Freq: Once | ORAL | Status: AC | PRN
  Administered 2012-05-19: 5 mg via ORAL

## 2012-05-19 MED ORDER — FENTANYL CITRATE 0.05 MG/ML IJ SOLN
INTRAMUSCULAR | Status: DC | PRN
  Administered 2012-05-19: 25 ug via INTRAVENOUS
  Administered 2012-05-19: 100 ug via INTRAVENOUS

## 2012-05-19 MED ORDER — MIDAZOLAM HCL 5 MG/5ML IJ SOLN
INTRAMUSCULAR | Status: DC | PRN
  Administered 2012-05-19: 2 mg via INTRAVENOUS

## 2012-05-19 MED ORDER — BUPIVACAINE-EPINEPHRINE 0.25% -1:200000 IJ SOLN
INTRAMUSCULAR | Status: DC | PRN
  Administered 2012-05-19: 10 mL

## 2012-05-19 MED ORDER — METOCLOPRAMIDE HCL 5 MG/ML IJ SOLN: 10.0000 mg | Freq: Once | INTRAMUSCULAR | Status: DC | PRN

## 2012-05-19 SURGICAL SUPPLY — 54 items
APL SKNCLS STERI-STRIP NONHPOA (GAUZE/BANDAGES/DRESSINGS) ×1
BALL CTTN LRG ABS STRL LF (GAUZE/BANDAGES/DRESSINGS) ×1
BANDAGE GAUZE ELAST BULKY 4 IN (GAUZE/BANDAGES/DRESSINGS) IMPLANT
BENZOIN TINCTURE PRP APPL 2/3 (GAUZE/BANDAGES/DRESSINGS) ×3 IMPLANT
BLADE SURG 15 STRL LF DISP TIS (BLADE) ×1 IMPLANT
BLADE SURG 15 STRL SS (BLADE) ×2
BLADE SURG ROTATE 9660 (MISCELLANEOUS) ×2 IMPLANT
CANISTER SUCTION 1200CC (MISCELLANEOUS) ×2 IMPLANT
CHLORAPREP W/TINT 26ML (MISCELLANEOUS) ×2 IMPLANT
CLEANER CAUTERY TIP 5X5 PAD (MISCELLANEOUS) ×1 IMPLANT
CLOTH BEACON ORANGE TIMEOUT ST (SAFETY) ×2 IMPLANT
COTTONBALL LRG STERILE PKG (GAUZE/BANDAGES/DRESSINGS) ×1 IMPLANT
COVER MAYO STAND STRL (DRAPES) ×2 IMPLANT
COVER TABLE BACK 60X90 (DRAPES) ×2 IMPLANT
DECANTER SPIKE VIAL GLASS SM (MISCELLANEOUS) IMPLANT
DRAPE LAPAROTOMY T 102X78X121 (DRAPES) ×2 IMPLANT
DRAPE UTILITY XL STRL (DRAPES) ×2 IMPLANT
DRSG EMULSION OIL 3X3 NADH (GAUZE/BANDAGES/DRESSINGS) ×1 IMPLANT
DRSG PAD ABDOMINAL 8X10 ST (GAUZE/BANDAGES/DRESSINGS) ×2 IMPLANT
ELECT REM PT RETURN 9FT ADLT (ELECTROSURGICAL) ×2
ELECTRODE REM PT RTRN 9FT ADLT (ELECTROSURGICAL) ×1 IMPLANT
GAUZE SPONGE 4X4 12PLY STRL LF (GAUZE/BANDAGES/DRESSINGS) ×4 IMPLANT
GAUZE SPONGE 4X4 16PLY XRAY LF (GAUZE/BANDAGES/DRESSINGS) IMPLANT
GLOVE BIO SURGEON STRL SZ 6.5 (GLOVE) ×1 IMPLANT
GLOVE BIO SURGEON STRL SZ7 (GLOVE) ×1 IMPLANT
GLOVE BIO SURGEON STRL SZ7.5 (GLOVE) ×2 IMPLANT
GLOVE BIOGEL PI IND STRL 7.0 (GLOVE) IMPLANT
GLOVE BIOGEL PI INDICATOR 7.0 (GLOVE) ×2
GOWN PREVENTION PLUS XLARGE (GOWN DISPOSABLE) ×4 IMPLANT
MATRIX SURGICAL PSM 7X10CM (Tissue) ×1 IMPLANT
MICROMATRIX 500MG (Tissue) ×2 IMPLANT
NDL HYPO 25X1 1.5 SAFETY (NEEDLE) ×1 IMPLANT
NEEDLE HYPO 25X1 1.5 SAFETY (NEEDLE) ×2 IMPLANT
NS IRRIG 1000ML POUR BTL (IV SOLUTION) ×1 IMPLANT
PACK BASIN DAY SURGERY FS (CUSTOM PROCEDURE TRAY) ×2 IMPLANT
PAD CLEANER CAUTERY TIP 5X5 (MISCELLANEOUS) ×1
PENCIL BUTTON HOLSTER BLD 10FT (ELECTRODE) ×2 IMPLANT
SOLUTION PARTIC MCRMTRX 500MG (Tissue) IMPLANT
SPONGE LAP 4X18 X RAY DECT (DISPOSABLE) IMPLANT
STRIP CLOSURE SKIN 1/2X4 (GAUZE/BANDAGES/DRESSINGS) IMPLANT
SURGILUBE 2OZ TUBE FLIPTOP (MISCELLANEOUS) ×1 IMPLANT
SUT MNCRL AB 3-0 PS2 18 (SUTURE) ×1 IMPLANT
SUT PROLENE 3 0 PS 2 (SUTURE) IMPLANT
SUT PROLENE 4 0 PS 2 18 (SUTURE) IMPLANT
SUT VIC AB 2-0 SH 27 (SUTURE) ×4
SUT VIC AB 2-0 SH 27XBRD (SUTURE) IMPLANT
SUT VICRYL 3-0 CR8 SH (SUTURE) IMPLANT
SYR CONTROL 10ML LL (SYRINGE) ×2 IMPLANT
TAPE CLOTH 3X10 TAN LF (GAUZE/BANDAGES/DRESSINGS) ×2 IMPLANT
TOWEL OR 17X24 6PK STRL BLUE (TOWEL DISPOSABLE) ×4 IMPLANT
TOWEL OR NON WOVEN STRL DISP B (DISPOSABLE) ×2 IMPLANT
TUBE CONNECTING 20X1/4 (TUBING) ×2 IMPLANT
WATER STERILE IRR 1000ML POUR (IV SOLUTION) ×1 IMPLANT
YANKAUER SUCT BULB TIP NO VENT (SUCTIONS) ×2 IMPLANT

## 2012-05-19 NOTE — Discharge Instructions (Signed)

## 2012-05-19 NOTE — Interval H&P Note (Signed)
History and Physical Interval Note:  05/19/2012 10:52 AM  Elijah Watkins  has presented today for surgery, with the diagnosis of recurrent pilonidal cyst  The various methods of treatment have been discussed with the patient and family. After consideration of risks, benefits and other options for treatment, the patient has consented to  Procedure(s) (LRB): IRRIGATION AND DEBRIDEMENT PILONIDAL CYST (N/A) as a surgical intervention .  The patients' history has been reviewed, patient examined, no change in status, stable for surgery.  I have reviewed the patients' chart and labs.  Questions were answered to the patient's satisfaction.     TOTH III,Sue Mcalexander S

## 2012-05-19 NOTE — H&P (View-Only) (Signed)
Subjective:     Patient ID: Elijah Watkins, male   DOB: May 19, 1966, 46 y.o.   MRN: [id number]  HPI The patient is a 72 her white male who has a history of a few months ago of an incision and drainage of a pilonidal abscess. His postoperative course was complicated by bleeding because of a blood thinner he is on for a cardiac stent. Initially the area seemed to heal well but has since flared up again. He has had intermittent drainage from the area. The area is tender.  Review of Systems  Constitutional: Negative.   HENT: Negative.   Eyes: Negative.   Respiratory: Negative.   Cardiovascular: Negative.   Gastrointestinal: Negative.   Genitourinary: Negative.   Musculoskeletal: Negative.   Skin: Negative.   Neurological: Negative.   Hematological: Negative.   Psychiatric/Behavioral: Negative.        Objective:   Physical Exam  Constitutional: He is oriented to person, place, and time. He appears well-developed and well-nourished.  HENT:  Head: Normocephalic and atraumatic.  Eyes: Conjunctivae and EOM are normal. Pupils are equal, round, and reactive to light.  Neck: Normal range of motion. Neck supple.  Cardiovascular: Normal rate, regular rhythm and normal heart sounds.   Pulmonary/Chest: Effort normal and breath sounds normal.  Abdominal: Soft. Bowel sounds are normal.  Genitourinary:       He has a recurrent indurated area in the gluteal cleft with a small amount of drainage. There is minimal redness.  Musculoskeletal: Normal range of motion.  Neurological: He is alert and oriented to person, place, and time.  Skin: Skin is warm and dry.  Psychiatric: He has a normal mood and affect. His behavior is normal.       Assessment:     Recurrent pilonidal abscess    Plan:     At this point I think the area needs to be re incised. I think he would be a good candidate for use of a cell. He is also at increased risk of bleeding complication given his blood thinners that he is  unable to stop because of his cardiac stent. I have discussed all this with him including the risks and benefits of surgery as well as some of the technical aspects and he understands and wishes to proceed.

## 2012-05-19 NOTE — Transfer of Care (Signed)
Immediate Anesthesia Transfer of Care Note  Patient: Elijah Watkins  Procedure(s) Performed: Procedure(s) (LRB): IRRIGATION AND DEBRIDEMENT PILONIDAL CYST (N/A)  Patient Location: PACU  Anesthesia Type: General  Level of Consciousness: awake  Airway & Oxygen Therapy: Patient Spontanous Breathing and Patient connected to face mask oxygen  Post-op Assessment: Report given to PACU RN and Post -op Vital signs reviewed and stable  Post vital signs: Reviewed and stable  Complications: No apparent anesthesia complications

## 2012-05-19 NOTE — Op Note (Signed)
05/19/2012  12:13 PM  PATIENT:  Elijah Watkins  46 y.o. male  PRE-OPERATIVE DIAGNOSIS:  recurrent pilonidal cyst  POST-OPERATIVE DIAGNOSIS:  recurrent pilonidal cyst  PROCEDURE:  Procedure(s) (LRB): IRRIGATION AND DEBRIDEMENT PILONIDAL CYST (N/A)  SURGEON:  Surgeon(s) and Role:    * Robyne Askew, MD - Primary  PHYSICIAN ASSISTANT:   ASSISTANTS: none   ANESTHESIA:   general  EBL:  Total I/O In: 1500 [I.V.:1500] Out: -   BLOOD ADMINISTERED:none  DRAINS: none   LOCAL MEDICATIONS USED:  MARCAINE     SPECIMEN:  Source of Specimen:  recurrent pilonidal abscess  DISPOSITION OF SPECIMEN:  PATHOLOGY  COUNTS:  YES  TOURNIQUET:  * No tourniquets in log *  DICTATION: .Dragon Dictation After informed consent was obtained the patient was brought to the operating room and left in the supine position on the stretcher. After adequate induction of general anesthesia the patient was moved into a prone position on the operating room table. The area of the recurrent polyp not all abscess was excised sharply with a 15 blade knife and electrocautery. The dissection was carried down to healthy subcutaneous fatty tissue. Hemostasis was achieved using the Bovie electrocautery. At this point the operative bed looked clean. Acel powder was then sprinkled into the base of the wound. The acel sheet was then placed into the wound and crafted so that it adhered to the sidewall of the wound. The acel sheet was fenestrated. A piece of Adaptic was then placed over the sheet. The wound was filled with sterile jelly. The wound was then packed with a cottonball and a cotton ball was placed over top. Vicryl stitches were then placed over the cotton ball to create a bolster dressing. Sterile dressings were then applied. The patient tolerated the procedure well. At the end of the case all needle sponge and instrument counts were correct. The patient was then awakened and taken to recovery in stable  condition.  PLAN OF CARE: Discharge to home after PACU  PATIENT DISPOSITION:  PACU - hemodynamically stable.   Delay start of Pharmacological VTE agent (>24hrs) due to surgical blood loss or risk of bleeding: not applicable

## 2012-05-19 NOTE — Anesthesia Procedure Notes (Signed)
Procedure Name: Intubation Date/Time: 05/19/2012 11:23 AM Performed by: Verlan Friends Pre-anesthesia Checklist: Patient identified, Emergency Drugs available, Suction available, Patient being monitored and Timeout performed Patient Re-evaluated:Patient Re-evaluated prior to inductionOxygen Delivery Method: Circle System Utilized Preoxygenation: Pre-oxygenation with 100% oxygen Intubation Type: IV induction Ventilation: Mask ventilation without difficulty and Oral airway inserted - appropriate to patient size Laryngoscope Size: Miller and 3 (unable to visualize anything) Grade View: Grade IV Tube type: Oral Tube size: 8.0 mm Number of attempts: 2 (glidescope needed and good visualization with that) Airway Equipment and Method: stylet and oral airway Placement Confirmation: ETT inserted through vocal cords under direct vision,  positive ETCO2 and breath sounds checked- equal and bilateral Secured at: 22 cm Tube secured with: Tape Dental Injury: Teeth and Oropharynx as per pre-operative assessment  Difficulty Due To: Difficulty was anticipated, Difficult Airway- due to limited oral opening and Difficult Airway- due to large tongue Future Recommendations: Recommend- induction with short-acting agent, and alternative techniques readily available Comments: Patient had good neck mobility.  One unsuccessful attempt with #3 Hyacinth Meeker.  Glidescope worked very well, great visualization with it.  Cords open and clear.  Easy intubation with glidescope #4.  Small nick on patient's lower lip.

## 2012-05-19 NOTE — Anesthesia Preprocedure Evaluation (Signed)
Anesthesia Evaluation  Patient identified by MRN, date of birth, ID band Patient awake    Reviewed: Allergy & Precautions, H&P , NPO status , Patient's Chart, lab work & pertinent test results, reviewed documented beta blocker date and time   History of Anesthesia Complications (+) PONV  Airway Mallampati: III TM Distance: >3 FB Neck ROM: full    Dental   Pulmonary neg pulmonary ROS,          Cardiovascular hypertension, On Medications and On Home Beta Blockers + CAD, + Past MI and + Cardiac Stents     Neuro/Psych PSYCHIATRIC DISORDERS negative neurological ROS     GI/Hepatic negative GI ROS, Neg liver ROS,   Endo/Other  Diabetes mellitus-, Oral Hypoglycemic Agents  Renal/GU negative Renal ROS  negative genitourinary   Musculoskeletal   Abdominal   Peds  Hematology negative hematology ROS (+)   Anesthesia Other Findings See surgeon's H&P   Reproductive/Obstetrics negative OB ROS                           Anesthesia Physical Anesthesia Plan  ASA: III  Anesthesia Plan: General   Post-op Pain Management:    Induction: Intravenous  Airway Management Planned: Oral ETT  Additional Equipment:   Intra-op Plan:   Post-operative Plan: Extubation in OR  Informed Consent: I have reviewed the patients History and Physical, chart, labs and discussed the procedure including the risks, benefits and alternatives for the proposed anesthesia with the patient or authorized representative who has indicated his/her understanding and acceptance.   Dental Advisory Given  Plan Discussed with: CRNA and Surgeon  Anesthesia Plan Comments:         Anesthesia Quick Evaluation

## 2012-05-19 NOTE — Anesthesia Postprocedure Evaluation (Signed)
Anesthesia Post Note  Patient: Elijah Watkins  Procedure(s) Performed: Procedure(s) (LRB): IRRIGATION AND DEBRIDEMENT PILONIDAL CYST (N/A)  Anesthesia type: General  Patient location: PACU  Post pain: Pain level controlled  Post assessment: Patient's Cardiovascular Status Stable  Last Vitals:  Filed Vitals:   05/19/12 1300  BP:   Pulse: 76  Temp:   Resp: 15    Post vital signs: Reviewed and stable  Level of consciousness: alert  Complications: No apparent anesthesia complications

## 2012-05-20 ENCOUNTER — Encounter (HOSPITAL_BASED_OUTPATIENT_CLINIC_OR_DEPARTMENT_OTHER): Payer: Self-pay | Admitting: General Surgery

## 2012-05-30 ENCOUNTER — Encounter (INDEPENDENT_AMBULATORY_CARE_PROVIDER_SITE_OTHER): Payer: Self-pay | Admitting: General Surgery

## 2012-05-30 ENCOUNTER — Ambulatory Visit (INDEPENDENT_AMBULATORY_CARE_PROVIDER_SITE_OTHER): Payer: 59 | Admitting: General Surgery

## 2012-05-30 VITALS — BP 118/84 | HR 71 | Temp 98.1°F | Resp 16 | Ht 71.0 in | Wt 252.2 lb

## 2012-05-30 DIAGNOSIS — L0501 Pilonidal cyst with abscess: Secondary | ICD-10-CM

## 2012-05-30 NOTE — Patient Instructions (Signed)
Change the dressing with wet gauze every third day. May shower if water hits front

## 2012-05-31 ENCOUNTER — Telehealth (INDEPENDENT_AMBULATORY_CARE_PROVIDER_SITE_OTHER): Payer: Self-pay

## 2012-05-31 NOTE — Telephone Encounter (Signed)
Returned the pt phone call and let him know that what he did was fine and that we still wont need to see him until Monday.  We do want them to continue to change the dressing every 3rd day. Pt understood and was okay with this.

## 2012-05-31 NOTE — Telephone Encounter (Signed)
Pt called stating his bandage came loose and the mesh and gauze came out. Pt s wife place mesh back in place and repacked wound with fresh moist gauze,covered with dry dsg. Pt wants to know if this is ok or does he need to be seen before Monday. Pt advised I will send msg to MD and Penni Bombard for response. Pt can be reached at [phone] or [phone].

## 2012-06-03 ENCOUNTER — Telehealth (INDEPENDENT_AMBULATORY_CARE_PROVIDER_SITE_OTHER): Payer: Self-pay | Admitting: General Surgery

## 2012-06-03 NOTE — Telephone Encounter (Signed)
Called pt to let him know I had to reschedule his appt from 6/24 to 6/25 at 8:30 due to the [employer] rep not being able to make it on Monday.  He said this was fine.

## 2012-06-06 ENCOUNTER — Encounter (INDEPENDENT_AMBULATORY_CARE_PROVIDER_SITE_OTHER): Payer: 59 | Admitting: General Surgery

## 2012-06-07 ENCOUNTER — Encounter (INDEPENDENT_AMBULATORY_CARE_PROVIDER_SITE_OTHER): Payer: Self-pay | Admitting: General Surgery

## 2012-06-07 ENCOUNTER — Ambulatory Visit (INDEPENDENT_AMBULATORY_CARE_PROVIDER_SITE_OTHER): Payer: 59 | Admitting: General Surgery

## 2012-06-07 DIAGNOSIS — L0501 Pilonidal cyst with abscess: Secondary | ICD-10-CM

## 2012-06-07 NOTE — Progress Notes (Signed)
Subjective:     Patient ID: ORVA GWALTNEY, male   DOB: 1966-04-26, 46 y.o.   MRN: [id number]  HPI The pt is a 46 yo wm who is 1 week s/p incision and drainage of recurrent pilonidal abscess with placement of acell. He has had some soreness but has tolerated it well.  Review of Systems     Objective:   Physical Exam On exam the dressing was removed. The wound is relatively clean. Acell was reapplied and he tolerated this well    Assessment:     S/P I and D of recurrent pilonidal abscess with acell    Plan:     Will have him change dressing every third day and keep dressing moist. Follow up in 1 week for wound check

## 2012-06-07 NOTE — Progress Notes (Signed)
Subjective:     Patient ID: Elijah Watkins, male   DOB: [birth year], 46 y.o.   MRN: [id number]  HPI The patient is a 46 -year-old white male who is a couple weeks out from an incision and drainage of a recurrent pilonidal abscess with placement of acell. He has been changing the dressing every third day or so. His discomfort level is slowly improving. His appetite is good and his bowels are working normally.  Review of Systems     Objective:   Physical Exam On exam the wound in the gluteal cleft area is very clean. We redressed the wound today with more of the acell and he tolerated this well    Assessment:     S/P I and D of recurrent pilonidal abscess   Plan:     Continue dressing changes every third day or so. Will follow up in next week or 2

## 2012-06-07 NOTE — Patient Instructions (Signed)
Leave outside dressing on for a couple days if clean then just change the outside

## 2012-06-10 ENCOUNTER — Other Ambulatory Visit: Payer: Self-pay | Admitting: Physical Medicine & Rehabilitation

## 2012-06-10 NOTE — Telephone Encounter (Signed)
Elijah Watkins's pharmacy is requesting a refill on neurontin.  Do You want to refill?  He hasn't been here since Nov 2012

## 2012-06-23 ENCOUNTER — Ambulatory Visit (INDEPENDENT_AMBULATORY_CARE_PROVIDER_SITE_OTHER): Payer: 59 | Admitting: General Surgery

## 2012-06-23 ENCOUNTER — Encounter (INDEPENDENT_AMBULATORY_CARE_PROVIDER_SITE_OTHER): Payer: Self-pay | Admitting: General Surgery

## 2012-06-23 VITALS — BP 100/68 | HR 64 | Temp 97.8°F | Resp 16 | Ht 71.0 in | Wt 257.0 lb

## 2012-06-23 DIAGNOSIS — L0501 Pilonidal cyst with abscess: Secondary | ICD-10-CM

## 2012-06-23 NOTE — Patient Instructions (Signed)
Keep dressing moist. Change outside every third day

## 2012-06-23 NOTE — Progress Notes (Signed)
Subjective:     Patient ID: Elijah Watkins, male   DOB: 10/06/1966, 46 y.o.   MRN: [id number]  HPI The patient is a 63 her white male who is about a month out from an incision and drainage of a recurrent pilonidal abscess with placement of acellular dermis. Since his last visit his wound is significantly more shallow. There is good healing granulation tissue. He complains of some soreness and muscle aches but otherwise seems to be doing well.  Review of Systems     Objective:   Physical Exam On exam the open wound is very clean and more shallow than it was a week or so ago. We reapplied the acell powder and sheet. He tolerated this well.    Assessment:     One month status post incision and drainage of recurrent pilonidal abscess with application of acell    Plan:     We will plan to see him back in one week for another wound check.

## 2012-07-01 ENCOUNTER — Encounter (INDEPENDENT_AMBULATORY_CARE_PROVIDER_SITE_OTHER): Payer: Self-pay | Admitting: General Surgery

## 2012-07-01 ENCOUNTER — Ambulatory Visit (INDEPENDENT_AMBULATORY_CARE_PROVIDER_SITE_OTHER): Payer: 59 | Admitting: General Surgery

## 2012-07-01 VITALS — BP 120/74 | HR 84 | Temp 98.1°F | Resp 18 | Ht 71.0 in | Wt 258.6 lb

## 2012-07-01 DIAGNOSIS — L0501 Pilonidal cyst with abscess: Secondary | ICD-10-CM

## 2012-07-01 NOTE — Progress Notes (Signed)
Subjective:     Patient ID: Elijah Watkins, male   DOB: 04/28/1966, 46 y.o.   MRN: [id number]  HPI The patient is a 19 her white male who is about a month and a half out from an incision and drainage of a pilonidal abscess that is recurrent. We have been treating him with acell. He has been making a lot of improvement. He complains of just of some minor soreness at the area.  Review of Systems     Objective:   Physical Exam On exam the wound has granulated well. The wound is pretty flat now. The wound is very clean.    Assessment:     One and a half months status post incision and drainage upon or abscess    Plan:     At this point I believe we can just place a dry dressing over the area since the wound is flat and small. We'll see him back in about 2 or 3 weeks to check his progress.

## 2012-07-01 NOTE — Patient Instructions (Signed)
Shower daily Keep dry dressing on area

## 2012-07-22 ENCOUNTER — Ambulatory Visit (INDEPENDENT_AMBULATORY_CARE_PROVIDER_SITE_OTHER): Payer: 59 | Admitting: General Surgery

## 2012-07-22 ENCOUNTER — Encounter (INDEPENDENT_AMBULATORY_CARE_PROVIDER_SITE_OTHER): Payer: Self-pay | Admitting: General Surgery

## 2012-07-22 VITALS — BP 134/86 | HR 91 | Temp 96.8°F | Ht 71.0 in | Wt 267.8 lb

## 2012-07-22 DIAGNOSIS — L0501 Pilonidal cyst with abscess: Secondary | ICD-10-CM

## 2012-07-22 NOTE — Patient Instructions (Signed)
May return to normal activities 

## 2012-08-04 NOTE — Progress Notes (Signed)
Subjective:     Patient ID: Elijah Watkins, male   DOB: 03-16-1966, 46 y.o.   MRN: [id number]  HPI The patient is a 6 her a white male who is a couple months out from an incision and drainage of a recurrent pilonidal abscess. He was treated with acell and tolerated this well. He currently has no complaints. He's not had any drainage from the area. He was able to go on vacation and not have any problems with the area.  Review of Systems     Objective:   Physical Exam On exam the area appears to be completely epithelialized. He does have some scar tissue there but this all looks good.    Assessment:     Status post incision and drainage of recurrent pilonidal abscess which is completely healed now    Plan:     At this point he may return to all his normal activities without any restrictions. We will plan to see him back on a prn basis

## 2012-09-16 ENCOUNTER — Encounter (INDEPENDENT_AMBULATORY_CARE_PROVIDER_SITE_OTHER): Payer: 59 | Admitting: General Surgery

## 2012-09-19 ENCOUNTER — Other Ambulatory Visit: Payer: Self-pay | Admitting: Physical Medicine & Rehabilitation

## 2012-09-22 ENCOUNTER — Other Ambulatory Visit: Payer: Self-pay | Admitting: Physical Medicine & Rehabilitation

## 2012-10-07 ENCOUNTER — Other Ambulatory Visit: Payer: Self-pay | Admitting: Physical Medicine & Rehabilitation

## 2012-10-20 ENCOUNTER — Encounter (INDEPENDENT_AMBULATORY_CARE_PROVIDER_SITE_OTHER): Payer: 59 | Admitting: General Surgery

## 2012-10-25 ENCOUNTER — Other Ambulatory Visit: Payer: Self-pay | Admitting: Physical Medicine & Rehabilitation

## 2012-11-21 ENCOUNTER — Encounter (INDEPENDENT_AMBULATORY_CARE_PROVIDER_SITE_OTHER): Payer: Self-pay | Admitting: General Surgery

## 2012-12-12 ENCOUNTER — Other Ambulatory Visit (HOSPITAL_COMMUNITY): Payer: Self-pay | Admitting: Specialist

## 2012-12-12 ENCOUNTER — Ambulatory Visit (HOSPITAL_COMMUNITY)
Admission: RE | Admit: 2012-12-12 | Discharge: 2012-12-12 | Disposition: A | Discharge status: Home / Self Care | Payer: 59 | Source: Ambulatory Visit | Attending: Specialist | Admitting: Specialist

## 2012-12-12 DIAGNOSIS — M25569 Pain in unspecified knee: Secondary | ICD-10-CM | POA: Insufficient documentation

## 2012-12-12 DIAGNOSIS — M25562 Pain in left knee: Secondary | ICD-10-CM

## 2012-12-15 ENCOUNTER — Encounter (INDEPENDENT_AMBULATORY_CARE_PROVIDER_SITE_OTHER): Payer: Self-pay | Admitting: General Surgery

## 2012-12-19 ENCOUNTER — Other Ambulatory Visit: Payer: Self-pay | Admitting: Physical Medicine & Rehabilitation

## 2013-02-02 ENCOUNTER — Other Ambulatory Visit: Payer: Self-pay | Admitting: Family Medicine

## 2013-02-02 ENCOUNTER — Ambulatory Visit
Admission: RE | Admit: 2013-02-02 | Discharge: 2013-02-02 | Disposition: A | Discharge status: Home / Self Care | Payer: 59 | Source: Ambulatory Visit | Attending: Family Medicine | Admitting: Family Medicine

## 2013-02-02 DIAGNOSIS — Z01818 Encounter for other preprocedural examination: Secondary | ICD-10-CM

## 2013-07-22 ENCOUNTER — Emergency Department (HOSPITAL_COMMUNITY): Payer: BC Managed Care – PPO

## 2013-07-22 ENCOUNTER — Encounter (HOSPITAL_COMMUNITY): Payer: Self-pay | Admitting: Emergency Medicine

## 2013-07-22 ENCOUNTER — Emergency Department (HOSPITAL_COMMUNITY)
Admission: EM | Admit: 2013-07-22 | Discharge: 2013-07-22 | Disposition: A | Discharge status: Home / Self Care | Payer: BC Managed Care – PPO | Attending: Emergency Medicine | Admitting: Emergency Medicine

## 2013-07-22 DIAGNOSIS — K625 Hemorrhage of anus and rectum: Secondary | ICD-10-CM | POA: Insufficient documentation

## 2013-07-22 DIAGNOSIS — I252 Old myocardial infarction: Secondary | ICD-10-CM | POA: Insufficient documentation

## 2013-07-22 DIAGNOSIS — Z7901 Long term (current) use of anticoagulants: Secondary | ICD-10-CM | POA: Insufficient documentation

## 2013-07-22 DIAGNOSIS — Z98811 Dental restoration status: Secondary | ICD-10-CM | POA: Insufficient documentation

## 2013-07-22 DIAGNOSIS — Z862 Personal history of diseases of the blood and blood-forming organs and certain disorders involving the immune mechanism: Secondary | ICD-10-CM | POA: Insufficient documentation

## 2013-07-22 DIAGNOSIS — Z87448 Personal history of other diseases of urinary system: Secondary | ICD-10-CM | POA: Insufficient documentation

## 2013-07-22 DIAGNOSIS — Z87891 Personal history of nicotine dependence: Secondary | ICD-10-CM | POA: Insufficient documentation

## 2013-07-22 DIAGNOSIS — Z87820 Personal history of traumatic brain injury: Secondary | ICD-10-CM | POA: Insufficient documentation

## 2013-07-22 DIAGNOSIS — Z9889 Other specified postprocedural states: Secondary | ICD-10-CM | POA: Insufficient documentation

## 2013-07-22 DIAGNOSIS — K922 Gastrointestinal hemorrhage, unspecified: Secondary | ICD-10-CM | POA: Insufficient documentation

## 2013-07-22 DIAGNOSIS — Z91040 Latex allergy status: Secondary | ICD-10-CM | POA: Insufficient documentation

## 2013-07-22 DIAGNOSIS — R110 Nausea: Secondary | ICD-10-CM | POA: Insufficient documentation

## 2013-07-22 DIAGNOSIS — Z8711 Personal history of peptic ulcer disease: Secondary | ICD-10-CM | POA: Insufficient documentation

## 2013-07-22 DIAGNOSIS — K921 Melena: Secondary | ICD-10-CM | POA: Insufficient documentation

## 2013-07-22 DIAGNOSIS — Z872 Personal history of diseases of the skin and subcutaneous tissue: Secondary | ICD-10-CM | POA: Insufficient documentation

## 2013-07-22 DIAGNOSIS — F411 Generalized anxiety disorder: Secondary | ICD-10-CM | POA: Insufficient documentation

## 2013-07-22 DIAGNOSIS — Z8639 Personal history of other endocrine, nutritional and metabolic disease: Secondary | ICD-10-CM | POA: Insufficient documentation

## 2013-07-22 DIAGNOSIS — Z9861 Coronary angioplasty status: Secondary | ICD-10-CM | POA: Insufficient documentation

## 2013-07-22 DIAGNOSIS — Z7982 Long term (current) use of aspirin: Secondary | ICD-10-CM | POA: Insufficient documentation

## 2013-07-22 DIAGNOSIS — I10 Essential (primary) hypertension: Secondary | ICD-10-CM | POA: Insufficient documentation

## 2013-07-22 DIAGNOSIS — Z79899 Other long term (current) drug therapy: Secondary | ICD-10-CM | POA: Insufficient documentation

## 2013-07-22 DIAGNOSIS — I2510 Atherosclerotic heart disease of native coronary artery without angina pectoris: Secondary | ICD-10-CM | POA: Insufficient documentation

## 2013-07-22 DIAGNOSIS — E119 Type 2 diabetes mellitus without complications: Secondary | ICD-10-CM | POA: Insufficient documentation

## 2013-07-22 DIAGNOSIS — E785 Hyperlipidemia, unspecified: Secondary | ICD-10-CM | POA: Insufficient documentation

## 2013-07-22 DIAGNOSIS — Z8709 Personal history of other diseases of the respiratory system: Secondary | ICD-10-CM | POA: Insufficient documentation

## 2013-07-22 DIAGNOSIS — Z794 Long term (current) use of insulin: Secondary | ICD-10-CM | POA: Insufficient documentation

## 2013-07-22 DIAGNOSIS — Z86718 Personal history of other venous thrombosis and embolism: Secondary | ICD-10-CM | POA: Insufficient documentation

## 2013-07-22 LAB — COMPREHENSIVE METABOLIC PANEL
ALT: 24 U/L (ref 0–53)
AST: 23 U/L (ref 0–37)
Albumin: 4.2 g/dL (ref 3.5–5.2)
Alkaline Phosphatase: 70 U/L (ref 39–117)
BUN: 17 mg/dL (ref 6–23)
CO2: 25 mEq/L (ref 19–32)
Calcium: 9.7 mg/dL (ref 8.4–10.5)
Chloride: 100 mEq/L (ref 96–112)
Creatinine, Ser: 1.51 mg/dL — ABNORMAL HIGH (ref 0.50–1.35)
GFR calc Af Amer: 62 mL/min — ABNORMAL LOW (ref >90)
GFR calc non Af Amer: 53 mL/min — ABNORMAL LOW (ref >90)
Glucose, Bld: 110 mg/dL — ABNORMAL HIGH (ref 70–99)
Potassium: 3.5 mEq/L (ref 3.5–5.1)
Sodium: 139 mEq/L (ref 135–145)
Total Bilirubin: 0.2 mg/dL — ABNORMAL LOW (ref 0.3–1.2)
Total Protein: 7.8 g/dL (ref 6.0–8.3)

## 2013-07-22 LAB — LIPASE, BLOOD: Lipase: 59 U/L (ref 11–59)

## 2013-07-22 LAB — CBC
HCT: 37.9 % — ABNORMAL LOW (ref 39.0–52.0)
Hemoglobin: 13.1 g/dL (ref 13.0–17.0)
MCH: 31 pg (ref 26.0–34.0)
MCHC: 34.6 g/dL (ref 30.0–36.0)
MCV: 89.8 fL (ref 78.0–100.0)
Platelets: 241 10*3/uL (ref 150–400)
RBC: 4.22 MIL/uL (ref 4.22–5.81)
RDW: 13.4 % (ref 11.5–15.5)
WBC: 6.1 10*3/uL (ref 4.0–10.5)

## 2013-07-22 MED ORDER — ONDANSETRON HCL 4 MG/2ML IJ SOLN
4.0000 mg | Freq: Once | INTRAMUSCULAR | Status: AC
  Administered 2013-07-22: 4 mg via INTRAVENOUS
  Filled 2013-07-22: qty 2

## 2013-07-22 MED ORDER — SODIUM CHLORIDE 0.9 % IV SOLN: INTRAVENOUS | Status: DC

## 2013-07-22 MED ORDER — HYDROMORPHONE HCL PF 1 MG/ML IJ SOLN
1.0000 mg | Freq: Once | INTRAMUSCULAR | Status: AC
  Administered 2013-07-22: 1 mg via INTRAVENOUS
  Filled 2013-07-22: qty 1

## 2013-07-22 MED ORDER — IOHEXOL 300 MG/ML  SOLN
100.0000 mL | Freq: Once | INTRAMUSCULAR | Status: AC | PRN
  Administered 2013-07-22: 100 mL via INTRAVENOUS

## 2013-07-22 MED ORDER — IOHEXOL 300 MG/ML  SOLN
50.0000 mL | Freq: Once | INTRAMUSCULAR | Status: AC | PRN
  Administered 2013-07-22: 50 mL via ORAL

## 2013-07-22 MED ORDER — SODIUM CHLORIDE 0.9 % IV BOLUS (SEPSIS)
1000.0000 mL | Freq: Once | INTRAVENOUS | Status: AC
  Administered 2013-07-22: 1000 mL via INTRAVENOUS

## 2013-07-22 NOTE — ED Provider Notes (Signed)
CSN: [id number]     Arrival date & time 07/22/13  1811 History     First MD Initiated Contact with Patient 07/22/13 1844     Chief Complaint  Patient presents with  . Abdominal Cramping  . Rectal Bleeding  . Nausea   (Consider location/radiation/quality/duration/timing/severity/associated sxs/prior Treatment) Patient is a 47 y.o. male presenting with cramps and hematochezia. The history is provided by the patient and the spouse.  Abdominal Cramping  Rectal Bleeding  patient presents complaining of epigastric pain which radiates to his right quadrant x1 day. No associated with food. Also notes bright red blood per rectum. Seen by his Dr. for similar symptoms yesterday and had a negative guaiac test. Denies any fever or chills. Some nausea without vomiting. Denies any hematuria dysuria. No prior history of same. No treatment used prior to arrival. Has been referred to GI  Past Medical History  Diagnosis Date  . Hyperlipidemia   . CAD (coronary artery disease)   . Overweight(278.02)   . Pilonidal cyst 05/2012    area is open, not draining  . Anxiety   . Myocardial infarction     x 2   . S/P coronary artery stent placement 2001, 05/2011  . Hypertension     under control, has been on med. > 7 yrs.  . Seasonal allergies   . Dental crowns present   . Diabetes mellitus     IDDM  . Nephrolithiasis     states has 3 stones in kidney, but no current problems  . Anger     anger issues  . History of concussion   . History of peptic ulcer age 62  . History of DVT (deep vein thrombosis) 03/2005  . PONV (postoperative nausea and vomiting)    Past Surgical History  Procedure Laterality Date  . Kidney stone surgery  1988 - 1998    x 3  . Knee cartilage surgery  10/2003, 11/2003    Also had bursa removed  . Wrist surgery  02/1997    right  . Coronary angioplasty with stent placement      x 2; last time 05/2011  . Cardiac catheterization  10/06/2010  . Chondroplasty  08/04/2004    right  knee  . Prepatellar bursa excision  12/05/2004    right  . Pilonidal cyst drainage  05/19/2012    Procedure: IRRIGATION AND DEBRIDEMENT PILONIDAL CYST;  Surgeon: Robyne Askew, MD;  Location: [facility];  Service: General;  Laterality: N/A;  I & D Pilonidal abscess, placement of acell   Family History  Problem Relation Age of Onset  . Hypertension Father   . Alcohol abuse Father 65  . Diabetes Mother 63   History  Substance Use Topics  . Smoking status: Former Smoker    Quit date: 02/24/1984  . Smokeless tobacco: Never Used  . Alcohol Use: Yes     Comment: rare    Review of Systems  Gastrointestinal: Positive for hematochezia.  All other systems reviewed and are negative.    Allergies  Latex  Home Medications   Current Outpatient Rx  Name  Route  Sig  Dispense  Refill  . ALPRAZolam (XANAX) 1 MG tablet   Oral   Take 1-2 mg by mouth 3 (three) times daily as needed.          Marland Kitchen aspirin 81 MG tablet   Oral   Take 81 mg by mouth daily.         Marland Kitchen  Blood Glucose Monitoring Suppl (ONE TOUCH ULTRA SYSTEM KIT) W/DEVICE KIT               . buPROPion (WELLBUTRIN XL) 300 MG 24 hr tablet   Oral   Take 300 mg by mouth daily.         . clopidogrel (PLAVIX) 75 MG tablet   Oral   Take 75 mg by mouth daily.         . Fe Fum-FePoly-Vit C-Vit B3 (INTEGRA PO)   Oral   Take 1 capsule by mouth daily.         Marland Kitchen gabapentin (NEURONTIN) 600 MG tablet      TAKE 1 TABLET BY MOUTH 3 TIMES A DAY   90 tablet   2   . gemfibrozil (LOPID) 600 MG tablet   Oral   Take 600 mg by mouth 2 (two) times daily before a meal.         . glucosamine-chondroitin 500-400 MG tablet   Oral   Take 2 tablets by mouth at bedtime.         . insulin NPH (HUMULIN N,NOVOLIN N) 100 UNIT/ML injection   Subcutaneous   Inject 63-74 Units into the skin 2 (two) times daily. Takes 74 units in the morning, 63 at night         . KOMBIGLYZE XR 2.04-999 MG TB24   Oral   Take 2  tablets by mouth at bedtime. PM         . metoprolol (LOPRESSOR) 50 MG tablet   Oral   Take 50 mg by mouth 2 (two) times daily.         . pravastatin (PRAVACHOL) 40 MG tablet   Oral   Take 80 mg by mouth at bedtime. PM         . vitamin C (ASCORBIC ACID) 500 MG tablet   Oral   Take 500 mg by mouth daily.         Marland Kitchen zolpidem (AMBIEN) 10 MG tablet   Oral   Take 10 mg by mouth at bedtime as needed for sleep.         . nitroGLYCERIN (NITROSTAT) 0.4 MG SL tablet   Sublingual   Place 0.4 mg under the tongue every 5 (five) minutes as needed.          BP 172/103  Pulse 73  Temp(Src) 97.9 F (36.6 C) (Oral)  Resp 20  SpO2 100% Physical Exam  Nursing note and vitals reviewed. Constitutional: He is oriented to person, place, and time. He appears well-developed and well-nourished.  Non-toxic appearance. No distress.  HENT:  Head: Normocephalic and atraumatic.  Eyes: Conjunctivae, EOM and lids are normal. Pupils are equal, round, and reactive to light.  Neck: Normal range of motion. Neck supple. No tracheal deviation present. No mass present.  Cardiovascular: Normal rate, regular rhythm and normal heart sounds.  Exam reveals no gallop.   No murmur heard. Pulmonary/Chest: Effort normal and breath sounds normal. No stridor. No respiratory distress. He has no decreased breath sounds. He has no wheezes. He has no rhonchi. He has no rales.  Abdominal: Soft. Normal appearance and bowel sounds are normal. He exhibits no distension. There is tenderness in the right upper quadrant and epigastric area. There is no rebound and no CVA tenderness.  Genitourinary:  Gross blood present in stool  Musculoskeletal: Normal range of motion. He exhibits no edema and no tenderness.  Neurological: He is alert and oriented to person, place,  and time. He has normal strength. No cranial nerve deficit or sensory deficit. GCS eye subscore is 4. GCS verbal subscore is 5. GCS motor subscore is 6.  Skin:  Skin is warm and dry. No abrasion and no rash noted.  Psychiatric: He has a normal mood and affect. His speech is normal and behavior is normal.    ED Course   Procedures (including critical care time)  Labs Reviewed  CBC  COMPREHENSIVE METABOLIC PANEL  LIPASE, BLOOD   No results found. No diagnosis found.  MDM  Patient offered admission for evaluation of his GI bleed he has deferred this time. He was strongly encouraged to followup with his GI doctor next week and given return precautions  Toy Baker, MD 07/22/13 2128

## 2013-07-22 NOTE — ED Notes (Signed)
Pt states that he has had intermittent blood in stool for about 5 months and GI has done several test and exams to find cause. Pt states that today he started having sever cramps in upper mid abdomen as well as bright red blood in stools, has had 3-4 stools today that were soft. Pt denies hard or liquid stools.

## 2013-08-01 ENCOUNTER — Emergency Department (HOSPITAL_COMMUNITY)
Admission: EM | Admit: 2013-08-01 | Discharge: 2013-08-01 | Disposition: A | Discharge status: Home / Self Care | Payer: BC Managed Care – PPO | Attending: Emergency Medicine | Admitting: Emergency Medicine

## 2013-08-01 ENCOUNTER — Encounter (HOSPITAL_COMMUNITY): Payer: Self-pay

## 2013-08-01 ENCOUNTER — Emergency Department (HOSPITAL_COMMUNITY): Payer: BC Managed Care – PPO

## 2013-08-01 DIAGNOSIS — R110 Nausea: Secondary | ICD-10-CM | POA: Insufficient documentation

## 2013-08-01 DIAGNOSIS — K8020 Calculus of gallbladder without cholecystitis without obstruction: Secondary | ICD-10-CM | POA: Insufficient documentation

## 2013-08-01 DIAGNOSIS — Z872 Personal history of diseases of the skin and subcutaneous tissue: Secondary | ICD-10-CM | POA: Insufficient documentation

## 2013-08-01 DIAGNOSIS — I10 Essential (primary) hypertension: Secondary | ICD-10-CM | POA: Insufficient documentation

## 2013-08-01 DIAGNOSIS — Z87891 Personal history of nicotine dependence: Secondary | ICD-10-CM | POA: Insufficient documentation

## 2013-08-01 DIAGNOSIS — Z86718 Personal history of other venous thrombosis and embolism: Secondary | ICD-10-CM | POA: Insufficient documentation

## 2013-08-01 DIAGNOSIS — Z7902 Long term (current) use of antithrombotics/antiplatelets: Secondary | ICD-10-CM | POA: Insufficient documentation

## 2013-08-01 DIAGNOSIS — I252 Old myocardial infarction: Secondary | ICD-10-CM | POA: Insufficient documentation

## 2013-08-01 DIAGNOSIS — Z794 Long term (current) use of insulin: Secondary | ICD-10-CM | POA: Insufficient documentation

## 2013-08-01 DIAGNOSIS — Z9861 Coronary angioplasty status: Secondary | ICD-10-CM | POA: Insufficient documentation

## 2013-08-01 DIAGNOSIS — E785 Hyperlipidemia, unspecified: Secondary | ICD-10-CM | POA: Insufficient documentation

## 2013-08-01 DIAGNOSIS — Z87820 Personal history of traumatic brain injury: Secondary | ICD-10-CM | POA: Insufficient documentation

## 2013-08-01 DIAGNOSIS — Z8709 Personal history of other diseases of the respiratory system: Secondary | ICD-10-CM | POA: Insufficient documentation

## 2013-08-01 DIAGNOSIS — Z8711 Personal history of peptic ulcer disease: Secondary | ICD-10-CM | POA: Insufficient documentation

## 2013-08-01 DIAGNOSIS — Z79899 Other long term (current) drug therapy: Secondary | ICD-10-CM | POA: Insufficient documentation

## 2013-08-01 DIAGNOSIS — E663 Overweight: Secondary | ICD-10-CM | POA: Insufficient documentation

## 2013-08-01 DIAGNOSIS — F411 Generalized anxiety disorder: Secondary | ICD-10-CM | POA: Insufficient documentation

## 2013-08-01 DIAGNOSIS — Z9889 Other specified postprocedural states: Secondary | ICD-10-CM | POA: Insufficient documentation

## 2013-08-01 DIAGNOSIS — Z7982 Long term (current) use of aspirin: Secondary | ICD-10-CM | POA: Insufficient documentation

## 2013-08-01 DIAGNOSIS — Z91040 Latex allergy status: Secondary | ICD-10-CM | POA: Insufficient documentation

## 2013-08-01 DIAGNOSIS — I2510 Atherosclerotic heart disease of native coronary artery without angina pectoris: Secondary | ICD-10-CM | POA: Insufficient documentation

## 2013-08-01 DIAGNOSIS — E119 Type 2 diabetes mellitus without complications: Secondary | ICD-10-CM | POA: Insufficient documentation

## 2013-08-01 DIAGNOSIS — Z87442 Personal history of urinary calculi: Secondary | ICD-10-CM | POA: Insufficient documentation

## 2013-08-01 LAB — COMPREHENSIVE METABOLIC PANEL
AST: 25 U/L (ref 0–37)
Albumin: 3.9 g/dL (ref 3.5–5.2)
Alkaline Phosphatase: 76 U/L (ref 39–117)
BUN: 19 mg/dL (ref 6–23)
CO2: 24 mEq/L (ref 19–32)
Chloride: 102 mEq/L (ref 96–112)
Creatinine, Ser: 1.63 mg/dL — ABNORMAL HIGH (ref 0.50–1.35)
GFR calc non Af Amer: 49 mL/min — ABNORMAL LOW (ref >90)
Potassium: 3.5 mEq/L (ref 3.5–5.1)
Total Bilirubin: 0.2 mg/dL — ABNORMAL LOW (ref 0.3–1.2)

## 2013-08-01 LAB — CBC WITH DIFFERENTIAL/PLATELET
Eosinophils Relative: 2 % (ref 0–5)
HCT: 37.6 % — ABNORMAL LOW (ref 39.0–52.0)
Hemoglobin: 13 g/dL (ref 13.0–17.0)
Lymphocytes Relative: 15 % (ref 12–46)
Lymphs Abs: 1.6 10*3/uL (ref 0.7–4.0)
MCV: 90.8 fL (ref 78.0–100.0)
Monocytes Absolute: 0.7 10*3/uL (ref 0.1–1.0)
Monocytes Relative: 6 % (ref 3–12)
Neutro Abs: 8.3 10*3/uL — ABNORMAL HIGH (ref 1.7–7.7)
RBC: 4.14 MIL/uL — ABNORMAL LOW (ref 4.22–5.81)
WBC: 10.8 10*3/uL — ABNORMAL HIGH (ref 4.0–10.5)

## 2013-08-01 MED ORDER — ONDANSETRON HCL 4 MG/2ML IJ SOLN
4.0000 mg | Freq: Once | INTRAMUSCULAR | Status: AC
  Administered 2013-08-01: 4 mg via INTRAVENOUS
  Filled 2013-08-01: qty 2

## 2013-08-01 MED ORDER — ONDANSETRON 8 MG PO TBDP: 8.0000 mg | ORAL_TABLET | Freq: Three times a day (TID) | ORAL | Status: DC | PRN

## 2013-08-01 MED ORDER — OXYCODONE-ACETAMINOPHEN 5-325 MG PO TABS: 1.0000 | ORAL_TABLET | ORAL | Status: DC | PRN

## 2013-08-01 MED ORDER — MORPHINE SULFATE 4 MG/ML IJ SOLN
6.0000 mg | Freq: Once | INTRAMUSCULAR | Status: AC
  Administered 2013-08-01: 6 mg via INTRAVENOUS
  Filled 2013-08-01: qty 2

## 2013-08-01 MED ORDER — HYDROMORPHONE HCL PF 1 MG/ML IJ SOLN
1.0000 mg | Freq: Once | INTRAMUSCULAR | Status: AC
  Administered 2013-08-01: 1 mg via INTRAVENOUS
  Filled 2013-08-01: qty 1

## 2013-08-01 NOTE — ED Provider Notes (Addendum)
CSN: [id number]     Arrival date & time 08/01/13  0636 History     First MD Initiated Contact with Patient 08/01/13 [phone]     Chief Complaint  Patient presents with  . Abdominal Pain    HPI Patient reports he awoke with severe epigastric and right upper quadrant abdominal pain with associated nausea.  No vomiting.  He states he had a similar episode like this one week ago and had a CT scan at that time but did not demonstrate obvious pathology the cause.  I personally reviewed the CT scan and the patient does have evidence of cholelithiasis on his CT scan which the patient was informed of.  The patient saw a gastroenterologist and at that time he was having any pain or symptoms and therefore no additional workup was completed.  The patient reports that the gastroenterology office he was also not informed of his diagnosis of gallstones.  No fevers or chills.  No melena or hematochezia.   Past Medical History  Diagnosis Date  . Hyperlipidemia   . CAD (coronary artery disease)   . Overweight(278.02)   . Pilonidal cyst 05/2012    area is open, not draining  . Anxiety   . Myocardial infarction     x 2   . S/P coronary artery stent placement 2001, 05/2011  . Hypertension     under control, has been on med. > 7 yrs.  . Seasonal allergies   . Dental crowns present   . Diabetes mellitus     IDDM  . Nephrolithiasis     states has 3 stones in kidney, but no current problems  . Anger     anger issues  . History of concussion   . History of peptic ulcer age 26  . History of DVT (deep vein thrombosis) 03/2005  . PONV (postoperative nausea and vomiting)    Past Surgical History  Procedure Laterality Date  . Kidney stone surgery  1988 - 1998    x 3  . Knee cartilage surgery  10/2003, 11/2003    Also had bursa removed  . Wrist surgery  02/1997    right  . Coronary angioplasty with stent placement      x 2; last time 05/2011  . Cardiac catheterization  10/06/2010  . Chondroplasty   08/04/2004    right knee  . Prepatellar bursa excision  12/05/2004    right  . Pilonidal cyst drainage  05/19/2012    Procedure: IRRIGATION AND DEBRIDEMENT PILONIDAL CYST;  Surgeon: Robyne Askew, MD;  Location: [facility];  Service: General;  Laterality: N/A;  I & D Pilonidal abscess, placement of acell   Family History  Problem Relation Age of Onset  . Hypertension Father   . Alcohol abuse Father 67  . Diabetes Mother 85   History  Substance Use Topics  . Smoking status: Former Smoker    Quit date: 02/24/1984  . Smokeless tobacco: Never Used  . Alcohol Use: Yes     Comment: rare    Review of Systems  All other systems reviewed and are negative.    Allergies  Latex  Home Medications   Current Outpatient Rx  Name  Route  Sig  Dispense  Refill  . ALPRAZolam (XANAX) 1 MG tablet   Oral   Take 1-2 mg by mouth 3 (three) times daily as needed for anxiety.          Marland Kitchen aspirin 81 MG tablet  Oral   Take 81 mg by mouth daily.         Marland Kitchen buPROPion (WELLBUTRIN XL) 300 MG 24 hr tablet   Oral   Take 300 mg by mouth daily.         . clopidogrel (PLAVIX) 75 MG tablet   Oral   Take 75 mg by mouth daily.         . Fe Fum-FePoly-Vit C-Vit B3 (INTEGRA PO)   Oral   Take 1 capsule by mouth daily.         Marland Kitchen gabapentin (NEURONTIN) 600 MG tablet      TAKE 1 TABLET BY MOUTH 3 TIMES A DAY   90 tablet   2   . gemfibrozil (LOPID) 600 MG tablet   Oral   Take 600 mg by mouth 2 (two) times daily before a meal.         . glucosamine-chondroitin 500-400 MG tablet   Oral   Take 2 tablets by mouth at bedtime.         . insulin NPH (HUMULIN N,NOVOLIN N) 100 UNIT/ML injection   Subcutaneous   Inject 63-74 Units into the skin 2 (two) times daily. Takes 74 units in the morning, 63 at night         . KOMBIGLYZE XR 2.04-999 MG TB24   Oral   Take 2 tablets by mouth at bedtime. PM         . metoprolol (LOPRESSOR) 50 MG tablet   Oral   Take 50 mg by  mouth 2 (two) times daily.         . pravastatin (PRAVACHOL) 80 MG tablet   Oral   Take 80 mg by mouth daily.         Marland Kitchen zolpidem (AMBIEN) 10 MG tablet   Oral   Take 10 mg by mouth at bedtime as needed for sleep.         . nitroGLYCERIN (NITROSTAT) 0.4 MG SL tablet   Sublingual   Place 0.4 mg under the tongue every 5 (five) minutes as needed.          BP 171/100  Pulse 79  Temp(Src) 97.5 F (36.4 C) (Oral)  Resp 20  Ht 5\' 11"  (1.803 m)  Wt 276 lb (125.193 kg)  BMI 38.51 kg/m2  SpO2 100% Physical Exam  Nursing note and vitals reviewed. Constitutional: He is oriented to person, place, and time. He appears well-developed and well-nourished.  HENT:  Head: Normocephalic and atraumatic.  Eyes: EOM are normal.  Neck: Normal range of motion.  Cardiovascular: Normal rate, regular rhythm, normal heart sounds and intact distal pulses.   Pulmonary/Chest: Effort normal and breath sounds normal. No respiratory distress.  Abdominal: Soft. He exhibits no distension.  Mild epigastric and right upper quadrant tenderness without guarding or rebound  Genitourinary: Rectum normal.  Musculoskeletal: Normal range of motion.  Neurological: He is alert and oriented to person, place, and time.  Skin: Skin is warm and dry.  Psychiatric: He has a normal mood and affect. Judgment normal.    ED Course   Procedures (including critical care time)  Labs Reviewed  CBC WITH DIFFERENTIAL - Abnormal; Notable for the following:    WBC 10.8 (*)    RBC 4.14 (*)    HCT 37.6 (*)    Neutro Abs 8.3 (*)    All other components within normal limits  COMPREHENSIVE METABOLIC PANEL - Abnormal; Notable for the following:    Creatinine, Ser 1.63 (*)  Total Bilirubin 0.2 (*)    GFR calc non Af Amer 49 (*)    GFR calc Af Amer 56 (*)    All other components within normal limits  LIPASE, BLOOD   US Abdomen Complete  08/01/2013   *RADIOLOGY REPORT*  Clinical Data:  Right upper quadrant abdominal pain.   History of gallstones.  COMPLETE ABDOMINAL ULTRASOUND  Comparison:  CT abdomen pelvis 07/22/2013 and 11/14/2010.  Findings:  Gallbladder:  Small shadowing stones are seen in the gallbladder. A stone in the gallbladder neck measures 9 mm.  No wall thickening or sonographic Murphy's sign.  Common bile duct:  Measures 3 mm, within normal limits.  Liver:  Normal in parenchymal echogenicity with the exception of a focal area of increased echogenicity in the central liver, measuring 5.0 x 3.7 x 4.5 cm.  IVC:  Appears normal.  Pancreas:  Tail is obscured by bowel gas.  Otherwise, negative.  Spleen:  Measures 8.2 cm, negative.  Right Kidney:  Measures 12.6 cm.  Parenchymal echogenicity is normal.  A 1 cm shadowing echogenic lesion is seen in the lower pole, without hydronephrosis.  Left Kidney:  Measures 12.7 cm.  Parenchymal echogenicity is normal.  Multiple shadowing stones are seen within.  An anechoic lesion with increased through transmission is seen in the upper pole, with a thin internal septation and some associated calcification, measuring approximately 5.7 x 8.2 cm.  Lesion corresponds to that seen on 07/22/2013 and may be minimally progressive from 11/14/2010. No hydronephrosis.  Abdominal aorta:  No aneurysm identified.  IMPRESSION:  1.  Cholelithiasis, including a gallstone in the neck of the gallbladder.  No findings of acute cholecystitis. 2.  Focal area of increased echogenicity in the central portion of the liver is indicative of focal fat deposition. 3.  Bilateral renal stones. 4.  Mildly complex upper pole left renal cyst versus chronic severe upper pole caliectasis, minimally progressive from 11/14/2010.   Original Report Authenticated By: Leanna Battles, M.D.   I personally reviewed the imaging tests through PACS system I reviewed available ER/hospitalization records through the EMR   No diagnosis found.  MDM  Symptoms consistent with cholelithiasis.  Ultrasound obtained demonstrating  cholelithiasis without cholecystitis.  There is a stone in the gallbladder neck.  Attempting to control the patient's pain at this time.  He'll likely need outpatient cholecystectomy as long as I can control his pain.  Lyanne Co, MD 08/01/13 [phone]  10:26 AM She feels much better at this time.  Discharge home.  Diagnosis biliary colic and cholelithiasis.  General surgery followup.  Return precautions given.  Lyanne Co, MD 08/01/13 1027

## 2013-08-01 NOTE — ED Notes (Signed)
Pt was seen here Saturday for the same, pt states that he started hurting again early this am, unable to use the bathroom

## 2013-08-01 NOTE — ED Notes (Signed)
MD at bedside. 

## 2013-08-01 NOTE — ED Notes (Signed)
Ultrasound at bedside

## 2013-08-02 ENCOUNTER — Encounter (INDEPENDENT_AMBULATORY_CARE_PROVIDER_SITE_OTHER): Payer: Self-pay | Admitting: General Surgery

## 2013-08-02 ENCOUNTER — Ambulatory Visit (INDEPENDENT_AMBULATORY_CARE_PROVIDER_SITE_OTHER): Payer: BC Managed Care – PPO | Admitting: General Surgery

## 2013-08-02 ENCOUNTER — Encounter (INDEPENDENT_AMBULATORY_CARE_PROVIDER_SITE_OTHER): Payer: Self-pay

## 2013-08-02 VITALS — BP 118/80 | HR 74 | Resp 14 | Ht 71.0 in | Wt 272.8 lb

## 2013-08-02 DIAGNOSIS — K8020 Calculus of gallbladder without cholecystitis without obstruction: Secondary | ICD-10-CM

## 2013-08-02 NOTE — Patient Instructions (Signed)
Plan for lap chole with ioc 

## 2013-08-02 NOTE — Progress Notes (Signed)
Patient ID: Elijah Watkins, male   DOB: 1966/07/10, 47 y.o.   MRN: [id number]  Chief Complaint  Patient presents with  . New Evaluation    eval GB    HPI Elijah Watkins is a 47 y.o. male.  We are asked to see the patient in consultation by Dr. Patria Mane to evaluate him for gallstones. The patient is a 47 year old white male who had his first episode of epigastric cramping and pain about 2 weeks ago. The pain was severe in nature. The pain was associated with nausea but no vomiting. The pain was so bad he went to the emergency department where a CT scan was done that showed stones in his gallbladder but no gallbladder wall thickening or duct dilatation. About 3 AM yesterday he had another episode of pain that doubled him over. He went back to the emergency department but the pain resolved while he was there. His liver functions were normal. His lipase was normal. He does have a history of coronary artery disease and stent placement and is followed by Dr. Lynnae Sandhoff at [facility]. He does take Plavix on a daily basis. HPI  Past Medical History  Diagnosis Date  . Hyperlipidemia   . CAD (coronary artery disease)   . Overweight(278.02)   . Pilonidal cyst 05/2012    area is open, not draining  . Anxiety   . Myocardial infarction     x 2   . S/P coronary artery stent placement 2001, 05/2011  . Hypertension     under control, has been on med. > 7 yrs.  . Seasonal allergies   . Dental crowns present   . Diabetes mellitus     IDDM  . Nephrolithiasis     states has 3 stones in kidney, but no current problems  . Anger     anger issues  . History of concussion   . History of peptic ulcer age 57  . History of DVT (deep vein thrombosis) 03/2005  . PONV (postoperative nausea and vomiting)     Past Surgical History  Procedure Laterality Date  . Kidney stone surgery  1988 - 1998    x 3  . Knee cartilage surgery  10/2003, 11/2003    Also had bursa removed  . Wrist surgery  02/1997     right  . Coronary angioplasty with stent placement      x 2; last time 05/2011  . Cardiac catheterization  10/06/2010  . Chondroplasty  08/04/2004    right knee  . Prepatellar bursa excision  12/05/2004    right  . Pilonidal cyst drainage  05/19/2012    Procedure: IRRIGATION AND DEBRIDEMENT PILONIDAL CYST;  Surgeon: Robyne Askew, MD;  Location: [facility];  Service: General;  Laterality: N/A;  I & D Pilonidal abscess, placement of acell    Family History  Problem Relation Age of Onset  . Hypertension Father   . Alcohol abuse Father 58  . Diabetes Mother 33    Social History History  Substance Use Topics  . Smoking status: Former Smoker    Quit date: 02/24/1984  . Smokeless tobacco: Never Used  . Alcohol Use: Yes     Comment: rare    Allergies  Allergen Reactions  . Latex Hives and Swelling    Current Outpatient Prescriptions  Medication Sig Dispense Refill  . ALPRAZolam (XANAX) 1 MG tablet Take 1-2 mg by mouth 3 (three) times daily as needed for anxiety.       Marland Kitchen  aspirin 81 MG tablet Take 81 mg by mouth daily.      . clopidogrel (PLAVIX) 75 MG tablet Take 75 mg by mouth daily.      . Fe Fum-FePoly-Vit C-Vit B3 (INTEGRA PO) Take 1 capsule by mouth daily.      Marland Kitchen gabapentin (NEURONTIN) 600 MG tablet TAKE 1 TABLET BY MOUTH 3 TIMES A DAY  90 tablet  2  . gemfibrozil (LOPID) 600 MG tablet Take 600 mg by mouth 2 (two) times daily before a meal.      . glucosamine-chondroitin 500-400 MG tablet Take 2 tablets by mouth at bedtime.      . insulin NPH (HUMULIN N,NOVOLIN N) 100 UNIT/ML injection Inject 63-74 Units into the skin 2 (two) times daily. Takes 74 units in the morning, 63 at night      . KOMBIGLYZE XR 2.04-999 MG TB24 Take 2 tablets by mouth at bedtime. PM      . metoprolol (LOPRESSOR) 50 MG tablet Take 50 mg by mouth 2 (two) times daily.      . nitroGLYCERIN (NITROSTAT) 0.4 MG SL tablet Place 0.4 mg under the tongue every 5 (five) minutes as needed.       . ondansetron (ZOFRAN ODT) 8 MG disintegrating tablet Take 1 tablet (8 mg total) by mouth every 8 (eight) hours as needed for nausea.  10 tablet  0  . oxyCODONE-acetaminophen (PERCOCET/ROXICET) 5-325 MG per tablet Take 1 tablet by mouth every 4 (four) hours as needed for pain.  25 tablet  0  . pravastatin (PRAVACHOL) 80 MG tablet Take 80 mg by mouth daily.      Marland Kitchen zolpidem (AMBIEN) 10 MG tablet Take 10 mg by mouth at bedtime as needed for sleep.       No current facility-administered medications for this visit.    Review of Systems Review of Systems  Constitutional: Negative.   HENT: Negative.   Eyes: Negative.   Respiratory: Negative.   Cardiovascular: Negative.   Gastrointestinal: Positive for nausea and abdominal pain.  Endocrine: Negative.   Genitourinary: Negative.   Musculoskeletal: Negative.   Skin: Negative.   Allergic/Immunologic: Negative.   Neurological: Negative.   Hematological: Negative.   Psychiatric/Behavioral: Negative.     Blood pressure 118/80, pulse 74, resp. rate 14, height 5\' 11"  (1.803 m), weight 272 lb 12.8 oz (123.741 kg).  Physical Exam Physical Exam  Constitutional: He is oriented to person, place, and time. He appears well-developed and well-nourished.  HENT:  Head: Normocephalic and atraumatic.  Eyes: Conjunctivae and EOM are normal. Pupils are equal, round, and reactive to light.  Neck: Normal range of motion. Neck supple.  Cardiovascular: Normal rate, regular rhythm and normal heart sounds.   Pulmonary/Chest: Effort normal and breath sounds normal.  Abdominal: Soft. Bowel sounds are normal.  Musculoskeletal: Normal range of motion.  Neurological: He is alert and oriented to person, place, and time.  Skin: Skin is warm and dry.  Psychiatric: He has a normal mood and affect. His behavior is normal.    Data Reviewed As above  Assessment    The patient appears to have symptomatic gallstones. Because of the risk of further painful episodes  and possible pancreatitis I think he would benefit from having his gallbladder removed. I've discussed with him in detail the risks and benefits of the operation to remove the gallbladder as well as some of the technical aspects and he understands and wishes to proceed. He will need cardiac clearance and also need to be  off his Plavix for 5 days prior to surgery.     Plan    Plan for laparoscopic cholecystectomy with intraoperative cholangiogram once we have cardiac clearance        TOTH III,Zaliah Wissner S 08/02/2013, 4:00 PM

## 2013-08-08 ENCOUNTER — Encounter (HOSPITAL_COMMUNITY): Payer: Self-pay | Admitting: Pharmacy Technician

## 2013-08-09 ENCOUNTER — Encounter (INDEPENDENT_AMBULATORY_CARE_PROVIDER_SITE_OTHER): Payer: Self-pay

## 2013-08-15 ENCOUNTER — Encounter (HOSPITAL_COMMUNITY): Payer: Self-pay

## 2013-08-15 ENCOUNTER — Encounter (HOSPITAL_COMMUNITY)
Admission: RE | Admit: 2013-08-15 | Discharge: 2013-08-15 | Disposition: A | Discharge status: Home / Self Care | Payer: BC Managed Care – PPO | Source: Ambulatory Visit | Attending: General Surgery | Admitting: General Surgery

## 2013-08-15 HISTORY — DX: Unspecified osteoarthritis, unspecified site: M19.90

## 2013-08-15 HISTORY — DX: Pneumonia, unspecified organism: J18.9

## 2013-08-15 HISTORY — DX: Gastro-esophageal reflux disease without esophagitis: K21.9

## 2013-08-15 LAB — CBC
Platelets: 292 10*3/uL (ref 150–400)
RBC: 4.1 MIL/uL — ABNORMAL LOW (ref 4.22–5.81)
WBC: 11.6 10*3/uL — ABNORMAL HIGH (ref 4.0–10.5)

## 2013-08-15 LAB — BASIC METABOLIC PANEL
CO2: 23 mEq/L (ref 19–32)
Chloride: 105 mEq/L (ref 96–112)
Potassium: 3.8 mEq/L (ref 3.5–5.1)
Sodium: 142 mEq/L (ref 135–145)

## 2013-08-15 NOTE — Progress Notes (Signed)
08/15/13 1518  OBSTRUCTIVE SLEEP APNEA  Have you ever been diagnosed with sleep apnea through a sleep study? No  Do you snore loudly (loud enough to be heard through closed doors)?  1  Do you often feel tired, fatigued, or sleepy during the daytime? 0  Has anyone observed you stop breathing during your sleep? 0  Do you have, or are you being treated for high blood pressure? 1  BMI more than 35 kg/m2? 1  Age over 47 years old? 0  Neck circumference greater than 40 cm/18 inches? 1  Gender: 1  Obstructive Sleep Apnea Score 5  Score 4 or greater  Results sent to PCP

## 2013-08-15 NOTE — Pre-Procedure Instructions (Signed)
Elijah Watkins  08/15/2013   Your procedure is scheduled on:  Friday, September 5th  Report to [facility] at 0530 AM.  Call this number if you have problems the morning of surgery: [phone]   Remember:   Do not eat food or drink liquids after midnight.   Take these medicines the morning of surgery with A SIP OF WATER: xanax if needed, neurontin, lopressor, nitroglycerin if needed, percocet if needed, zofran if needed  Do NOT take any insulin morning of surgery.  Stop taking plavix, aspirin, glucosamine today.   Do not wear jewelry.  Do not wear lotions, powders, or perfumes. You may wear deodorant.  Do not shave 48 hours prior to surgery. Men may shave face and neck.  Do not bring valuables to the hospital.  [facility] is not responsible  for any belongings or valuables.  Contacts, dentures or bridgework may not be worn into surgery.  Leave suitcase in the car. After surgery it may be brought to your room.  For patients admitted to the hospital, checkout time is 11:00 AM the day of discharge.   Patients discharged the day of surgery will not be allowed to drive home.   Special Instructions: Shower using CHG 2 nights before surgery and the night before surgery.  If you shower the day of surgery use CHG.  Use special wash - you have one bottle of CHG for all showers.  You should use approximately 1/3 of the bottle for each shower.   Please read over the following fact sheets that you were given: Pain Booklet, Coughing and Deep Breathing and Surgical Site Infection Prevention

## 2013-08-15 NOTE — Progress Notes (Signed)
Primary physician - dr. Karma Greaser - [facility] Cardiologist - [place] - [facility] - [place]  ek[facility] not within last year, cath2012,- will request records

## 2013-08-16 ENCOUNTER — Telehealth (INDEPENDENT_AMBULATORY_CARE_PROVIDER_SITE_OTHER): Payer: Self-pay

## 2013-08-16 ENCOUNTER — Other Ambulatory Visit (INDEPENDENT_AMBULATORY_CARE_PROVIDER_SITE_OTHER): Payer: Self-pay

## 2013-08-16 DIAGNOSIS — K8020 Calculus of gallbladder without cholecystitis without obstruction: Secondary | ICD-10-CM

## 2013-08-16 NOTE — Telephone Encounter (Signed)
I called patient back. He is extremely upset that he has to take time off work tomorrow to go see nephrology tomorrow to hopefully get clearance. I explained I understand his frustration but I couldn't do anything about this. He has to get clearance or surgery will be cancelled. He will take this up with Dr Carolynne Edouard and anesthesiologist Friday.

## 2013-08-16 NOTE — Consult Note (Signed)
Anesthesiology.  I discussed   creatinine with Dr. Carolynne Edouard. He will arrange pre-op nephrology consult.  Kipp Brood

## 2013-08-16 NOTE — Consult Note (Signed)
47 year old male scheduled for laparoscopic cholecystectomy on 08/18/13. By Dr. Carolynne Edouard. 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  Repeat catheterization at [facility] on 10/06/2010 showed 50-70% RCA stenosis with patent RCA stent. LV EF 50-55%. Medical treatment recommended.  He has been followed by Dr. Bonnielee Haff at [facility] in [facility] who saw him on 08/08/13 and feels he is OK for surgery from cardiac standpoint and he can go off the plavix indefinitely and OK to hold aspirin for 5 days pre-op.   His lab work 08/17/13 is notable for creatinine 2.5. Most recent previous creatinine was 1.34 on 05/19/12.  Will discuss patient's renal function with Dr. Carolynne Edouard.  Kipp Brood, MD

## 2013-08-16 NOTE — Telephone Encounter (Signed)
Message copied by Brennan Bailey on Wed Aug 16, 2013  3:08 PM ------      Message from: Larry Sierras      Created: Wed Aug 16, 2013  2:46 PM      Regarding: U/A APPT      Contact: [phone]       PT REQ CALL HIM ASAP/IS OV WITH UA MD REQUIRED PRIOR TO SX 08-18-13?  GM ------

## 2013-08-17 MED ORDER — DEXTROSE 5 % IV SOLN
3.0000 g | INTRAVENOUS | Status: AC
  Administered 2013-08-18: 3 g via INTRAVENOUS
  Filled 2013-08-17 (×2): qty 3000

## 2013-08-18 ENCOUNTER — Ambulatory Visit (HOSPITAL_COMMUNITY): Payer: BC Managed Care – PPO | Admitting: Anesthesiology

## 2013-08-18 ENCOUNTER — Encounter (HOSPITAL_COMMUNITY): Payer: Self-pay | Admitting: Anesthesiology

## 2013-08-18 ENCOUNTER — Ambulatory Visit (HOSPITAL_COMMUNITY)
Admission: RE | Admit: 2013-08-18 | Discharge: 2013-08-19 | Disposition: A | Discharge status: Home / Self Care | Payer: BC Managed Care – PPO | Source: Ambulatory Visit | Attending: General Surgery | Admitting: General Surgery

## 2013-08-18 ENCOUNTER — Ambulatory Visit (HOSPITAL_COMMUNITY): Payer: BC Managed Care – PPO

## 2013-08-18 ENCOUNTER — Encounter (HOSPITAL_COMMUNITY)
Admission: RE | Disposition: A | Discharge status: Home / Self Care | Payer: Self-pay | Source: Ambulatory Visit | Attending: General Surgery

## 2013-08-18 DIAGNOSIS — E119 Type 2 diabetes mellitus without complications: Secondary | ICD-10-CM | POA: Insufficient documentation

## 2013-08-18 DIAGNOSIS — E669 Obesity, unspecified: Secondary | ICD-10-CM | POA: Insufficient documentation

## 2013-08-18 DIAGNOSIS — Z9861 Coronary angioplasty status: Secondary | ICD-10-CM | POA: Insufficient documentation

## 2013-08-18 DIAGNOSIS — Z01812 Encounter for preprocedural laboratory examination: Secondary | ICD-10-CM | POA: Insufficient documentation

## 2013-08-18 DIAGNOSIS — I2510 Atherosclerotic heart disease of native coronary artery without angina pectoris: Secondary | ICD-10-CM | POA: Insufficient documentation

## 2013-08-18 DIAGNOSIS — K8010 Calculus of gallbladder with chronic cholecystitis without obstruction: Secondary | ICD-10-CM | POA: Insufficient documentation

## 2013-08-18 DIAGNOSIS — K8020 Calculus of gallbladder without cholecystitis without obstruction: Secondary | ICD-10-CM | POA: Diagnosis present

## 2013-08-18 DIAGNOSIS — Z794 Long term (current) use of insulin: Secondary | ICD-10-CM | POA: Insufficient documentation

## 2013-08-18 DIAGNOSIS — I252 Old myocardial infarction: Secondary | ICD-10-CM | POA: Insufficient documentation

## 2013-08-18 DIAGNOSIS — I10 Essential (primary) hypertension: Secondary | ICD-10-CM | POA: Insufficient documentation

## 2013-08-18 HISTORY — PX: CHOLECYSTECTOMY: SHX55

## 2013-08-18 LAB — CREATININE, SERUM
Creatinine, Ser: 1.69 mg/dL — ABNORMAL HIGH (ref 0.50–1.35)
GFR calc Af Amer: 54 mL/min — ABNORMAL LOW (ref >90)
GFR calc non Af Amer: 47 mL/min — ABNORMAL LOW (ref >90)

## 2013-08-18 LAB — GLUCOSE, CAPILLARY: Glucose-Capillary: 87 mg/dL (ref 70–99)

## 2013-08-18 SURGERY — LAPAROSCOPIC CHOLECYSTECTOMY WITH INTRAOPERATIVE CHOLANGIOGRAM
Anesthesia: General | Site: Abdomen | Wound class: Contaminated

## 2013-08-18 MED ORDER — CLOPIDOGREL BISULFATE 75 MG PO TABS
75.0000 mg | ORAL_TABLET | Freq: Every day | ORAL | Status: DC
  Filled 2013-08-18 (×2): qty 1

## 2013-08-18 MED ORDER — LIDOCAINE HCL (CARDIAC) 20 MG/ML IV SOLN
INTRAVENOUS | Status: DC | PRN
  Administered 2013-08-18: 25 mg via INTRAVENOUS

## 2013-08-18 MED ORDER — SIMVASTATIN 40 MG PO TABS: 40.0000 mg | ORAL_TABLET | Freq: Every day | ORAL | Status: DC

## 2013-08-18 MED ORDER — SODIUM CHLORIDE 0.9 % IV SOLN
INTRAVENOUS | Status: DC | PRN
  Administered 2013-08-18: 07:00:00 via INTRAVENOUS

## 2013-08-18 MED ORDER — CALCIUM CARBONATE ANTACID 750 MG PO CHEW: 2.0000 | CHEWABLE_TABLET | Freq: Two times a day (BID) | ORAL | Status: DC | PRN

## 2013-08-18 MED ORDER — 0.9 % SODIUM CHLORIDE (POUR BTL) OPTIME
TOPICAL | Status: DC | PRN
  Administered 2013-08-18: 1000 mL

## 2013-08-18 MED ORDER — ASPIRIN 81 MG PO CHEW
81.0000 mg | CHEWABLE_TABLET | Freq: Every day | ORAL | Status: DC
  Administered 2013-08-18: 81 mg via ORAL
  Filled 2013-08-18 (×2): qty 1

## 2013-08-18 MED ORDER — BUPIVACAINE-EPINEPHRINE PF 0.25-1:200000 % IJ SOLN
INTRAMUSCULAR | Status: AC
  Filled 2013-08-18: qty 30

## 2013-08-18 MED ORDER — ASPIRIN 81 MG PO TABS: 81.0000 mg | ORAL_TABLET | Freq: Every day | ORAL | Status: DC

## 2013-08-18 MED ORDER — ARTIFICIAL TEARS OP OINT
TOPICAL_OINTMENT | OPHTHALMIC | Status: DC | PRN
  Administered 2013-08-18: 1 via OPHTHALMIC

## 2013-08-18 MED ORDER — EPHEDRINE SULFATE 50 MG/ML IJ SOLN
INTRAMUSCULAR | Status: DC | PRN
  Administered 2013-08-18: 10 mg via INTRAVENOUS

## 2013-08-18 MED ORDER — CALCIUM CARBONATE ANTACID 500 MG PO CHEW: 3.0000 | CHEWABLE_TABLET | Freq: Two times a day (BID) | ORAL | Status: DC | PRN

## 2013-08-18 MED ORDER — FENTANYL CITRATE 0.05 MG/ML IJ SOLN
INTRAMUSCULAR | Status: DC | PRN
  Administered 2013-08-18: 250 ug via INTRAVENOUS

## 2013-08-18 MED ORDER — CHLORHEXIDINE GLUCONATE 4 % EX LIQD: 1.0000 "application " | Freq: Once | CUTANEOUS | Status: DC

## 2013-08-18 MED ORDER — ONDANSETRON HCL 4 MG/2ML IJ SOLN: 4.0000 mg | Freq: Four times a day (QID) | INTRAMUSCULAR | Status: DC | PRN

## 2013-08-18 MED ORDER — OXYCODONE-ACETAMINOPHEN 5-325 MG PO TABS: 1.0000 | ORAL_TABLET | ORAL | Status: DC | PRN

## 2013-08-18 MED ORDER — ROSUVASTATIN CALCIUM 10 MG PO TABS
10.0000 mg | ORAL_TABLET | Freq: Every day | ORAL | Status: DC
  Administered 2013-08-18: 10 mg via ORAL
  Filled 2013-08-18 (×2): qty 1

## 2013-08-18 MED ORDER — ZOLPIDEM TARTRATE 5 MG PO TABS
10.0000 mg | ORAL_TABLET | Freq: Every day | ORAL | Status: DC
  Administered 2013-08-18: 10 mg via ORAL
  Filled 2013-08-18: qty 2

## 2013-08-18 MED ORDER — GABAPENTIN 600 MG PO TABS
600.0000 mg | ORAL_TABLET | Freq: Three times a day (TID) | ORAL | Status: DC
  Administered 2013-08-18 – 2013-08-19 (×3): 600 mg via ORAL
  Filled 2013-08-18 (×6): qty 1

## 2013-08-18 MED ORDER — NITROGLYCERIN 0.4 MG SL SUBL: 0.4000 mg | SUBLINGUAL_TABLET | SUBLINGUAL | Status: DC | PRN

## 2013-08-18 MED ORDER — ROCURONIUM BROMIDE 100 MG/10ML IV SOLN
INTRAVENOUS | Status: DC | PRN
  Administered 2013-08-18: 50 mg via INTRAVENOUS

## 2013-08-18 MED ORDER — MIDAZOLAM HCL 2 MG/2ML IJ SOLN: 0.5000 mg | Freq: Once | INTRAMUSCULAR | Status: DC | PRN

## 2013-08-18 MED ORDER — GEMFIBROZIL 600 MG PO TABS
600.0000 mg | ORAL_TABLET | Freq: Two times a day (BID) | ORAL | Status: DC
  Administered 2013-08-18 – 2013-08-19 (×2): 600 mg via ORAL
  Filled 2013-08-18 (×4): qty 1

## 2013-08-18 MED ORDER — PROMETHAZINE HCL 25 MG/ML IJ SOLN: 6.2500 mg | INTRAMUSCULAR | Status: DC | PRN

## 2013-08-18 MED ORDER — LACTATED RINGERS IV SOLN
INTRAVENOUS | Status: DC | PRN
  Administered 2013-08-18 (×2): via INTRAVENOUS

## 2013-08-18 MED ORDER — ONDANSETRON HCL 4 MG PO TABS: 4.0000 mg | ORAL_TABLET | Freq: Four times a day (QID) | ORAL | Status: DC | PRN

## 2013-08-18 MED ORDER — ALPRAZOLAM 0.5 MG PO TABS
1.0000 mg | ORAL_TABLET | Freq: Three times a day (TID) | ORAL | Status: DC | PRN
  Administered 2013-08-18: 1 mg via ORAL
  Filled 2013-08-18: qty 2

## 2013-08-18 MED ORDER — BUPIVACAINE-EPINEPHRINE 0.25% -1:200000 IJ SOLN
INTRAMUSCULAR | Status: DC | PRN
  Administered 2013-08-18: 27 mL

## 2013-08-18 MED ORDER — OXYCODONE HCL 5 MG PO TABS: 5.0000 mg | ORAL_TABLET | Freq: Once | ORAL | Status: DC | PRN

## 2013-08-18 MED ORDER — INSULIN NPH (HUMAN) (ISOPHANE) 100 UNIT/ML ~~LOC~~ SUSP
63.0000 [IU] | Freq: Every day | SUBCUTANEOUS | Status: DC
  Administered 2013-08-18: 63 [IU] via SUBCUTANEOUS
  Filled 2013-08-18: qty 10

## 2013-08-18 MED ORDER — MEPERIDINE HCL 25 MG/ML IJ SOLN: 6.2500 mg | INTRAMUSCULAR | Status: DC | PRN

## 2013-08-18 MED ORDER — OXYCODONE HCL 5 MG/5ML PO SOLN: 5.0000 mg | Freq: Once | ORAL | Status: DC | PRN

## 2013-08-18 MED ORDER — SODIUM CHLORIDE 0.9 % IV SOLN
INTRAVENOUS | Status: DC | PRN
  Administered 2013-08-18: 09:00:00

## 2013-08-18 MED ORDER — HYDROMORPHONE HCL PF 1 MG/ML IJ SOLN
0.2500 mg | INTRAMUSCULAR | Status: DC | PRN
  Administered 2013-08-18 (×2): 0.5 mg via INTRAVENOUS

## 2013-08-18 MED ORDER — MIDAZOLAM HCL 5 MG/5ML IJ SOLN
INTRAMUSCULAR | Status: DC | PRN
  Administered 2013-08-18: 2 mg via INTRAVENOUS

## 2013-08-18 MED ORDER — MORPHINE SULFATE 4 MG/ML IJ SOLN
4.0000 mg | INTRAMUSCULAR | Status: DC | PRN
  Administered 2013-08-18: 4 mg via INTRAVENOUS
  Filled 2013-08-18: qty 1

## 2013-08-18 MED ORDER — PROPOFOL 10 MG/ML IV BOLUS
INTRAVENOUS | Status: DC | PRN
  Administered 2013-08-18: 200 mg via INTRAVENOUS

## 2013-08-18 MED ORDER — INSULIN NPH (HUMAN) (ISOPHANE) 100 UNIT/ML ~~LOC~~ SUSP
74.0000 [IU] | Freq: Every day | SUBCUTANEOUS | Status: DC
  Administered 2013-08-19: 74 [IU] via SUBCUTANEOUS
  Filled 2013-08-18: qty 10

## 2013-08-18 MED ORDER — SODIUM CHLORIDE 0.9 % IV SOLN
INTRAVENOUS | Status: DC
  Administered 2013-08-18: 15:00:00 via INTRAVENOUS

## 2013-08-18 MED ORDER — HYDROMORPHONE HCL PF 1 MG/ML IJ SOLN
INTRAMUSCULAR | Status: AC
  Filled 2013-08-18: qty 1

## 2013-08-18 MED ORDER — SODIUM CHLORIDE 0.9 % IR SOLN
Status: DC | PRN
  Administered 2013-08-18: 1000 mL

## 2013-08-18 MED ORDER — NEOSTIGMINE METHYLSULFATE 1 MG/ML IJ SOLN
INTRAMUSCULAR | Status: DC | PRN
  Administered 2013-08-18: 4 mg via INTRAVENOUS

## 2013-08-18 MED ORDER — ONDANSETRON HCL 4 MG/2ML IJ SOLN
INTRAMUSCULAR | Status: DC | PRN
  Administered 2013-08-18 (×2): 4 mg via INTRAVENOUS

## 2013-08-18 MED ORDER — METOPROLOL TARTRATE 50 MG PO TABS
50.0000 mg | ORAL_TABLET | Freq: Two times a day (BID) | ORAL | Status: DC
  Administered 2013-08-18: 50 mg via ORAL
  Filled 2013-08-18 (×3): qty 1

## 2013-08-18 MED ORDER — GLYCOPYRROLATE 0.2 MG/ML IJ SOLN
INTRAMUSCULAR | Status: DC | PRN
  Administered 2013-08-18: .8 mg via INTRAVENOUS

## 2013-08-18 MED ORDER — SAXAGLIPTIN-METFORMIN ER 2.5-1000 MG PO TB24: 2.0000 | ORAL_TABLET | Freq: Every day | ORAL | Status: DC

## 2013-08-18 MED ORDER — METOCLOPRAMIDE HCL 5 MG/ML IJ SOLN
INTRAMUSCULAR | Status: DC | PRN
  Administered 2013-08-18: 5 mg via INTRAVENOUS

## 2013-08-18 MED ORDER — LINAGLIPTIN 5 MG PO TABS
5.0000 mg | ORAL_TABLET | Freq: Every day | ORAL | Status: DC
  Administered 2013-08-18: 5 mg via ORAL
  Filled 2013-08-18 (×2): qty 1

## 2013-08-18 SURGICAL SUPPLY — 43 items
ADH SKN CLS APL DERMABOND .7 (GAUZE/BANDAGES/DRESSINGS) ×1
APPLIER CLIP ROT 10 11.4 M/L (STAPLE) ×2
APR CLP MED LRG 11.4X10 (STAPLE) ×1
BAG SPEC RTRVL LRG 6X4 10 (ENDOMECHANICALS) ×1
BLADE SURG ROTATE 9660 (MISCELLANEOUS) IMPLANT
CANISTER SUCTION 2500CC (MISCELLANEOUS) ×2 IMPLANT
CHLORAPREP W/TINT 26ML (MISCELLANEOUS) ×2 IMPLANT
CLIP APPLIE ROT 10 11.4 M/L (STAPLE) ×1 IMPLANT
CLOTH BEACON ORANGE TIMEOUT ST (SAFETY) ×2 IMPLANT
COVER MAYO STAND STRL (DRAPES) ×2 IMPLANT
COVER SURGICAL LIGHT HANDLE (MISCELLANEOUS) ×2 IMPLANT
DECANTER SPIKE VIAL GLASS SM (MISCELLANEOUS) ×4 IMPLANT
DERMABOND ADVANCED (GAUZE/BANDAGES/DRESSINGS) ×1
DERMABOND ADVANCED .7 DNX12 (GAUZE/BANDAGES/DRESSINGS) ×1 IMPLANT
DRAPE C-ARM 42X72 X-RAY (DRAPES) ×2 IMPLANT
DRAPE UTILITY 15X26 W/TAPE STR (DRAPE) ×4 IMPLANT
ELECT REM PT RETURN 9FT ADLT (ELECTROSURGICAL) ×2
ELECTRODE REM PT RTRN 9FT ADLT (ELECTROSURGICAL) ×1 IMPLANT
GLOVE BIOGEL PI IND STRL 7.0 (GLOVE) IMPLANT
GLOVE BIOGEL PI IND STRL 7.5 (GLOVE) IMPLANT
GLOVE BIOGEL PI INDICATOR 7.0 (GLOVE) ×1
GLOVE BIOGEL PI INDICATOR 7.5 (GLOVE) ×1
GLOVE SURG SS PI 7.0 STRL IVOR (GLOVE) ×3 IMPLANT
GLOVE SURG SS PI 7.5 STRL IVOR (GLOVE) ×4 IMPLANT
GOWN STRL NON-REIN LRG LVL3 (GOWN DISPOSABLE) ×8 IMPLANT
IV CATH 14GX2 1/4 (CATHETERS) ×2 IMPLANT
KIT BASIN OR (CUSTOM PROCEDURE TRAY) ×2 IMPLANT
KIT ROOM TURNOVER OR (KITS) ×2 IMPLANT
NS IRRIG 1000ML POUR BTL (IV SOLUTION) ×2 IMPLANT
PAD ARMBOARD 7.5X6 YLW CONV (MISCELLANEOUS) ×2 IMPLANT
POUCH SPECIMEN RETRIEVAL 10MM (ENDOMECHANICALS) ×2 IMPLANT
SCISSORS LAP 5X35 DISP (ENDOMECHANICALS) IMPLANT
SET CHOLANGIOGRAPH 5 50 .035 (SET/KITS/TRAYS/PACK) ×1 IMPLANT
SET IRRIG TUBING LAPAROSCOPIC (IRRIGATION / IRRIGATOR) ×2 IMPLANT
SLEEVE ENDOPATH XCEL 5M (ENDOMECHANICALS) ×2 IMPLANT
SPECIMEN JAR SMALL (MISCELLANEOUS) ×2 IMPLANT
SUT MNCRL AB 4-0 PS2 18 (SUTURE) ×2 IMPLANT
TOWEL OR 17X24 6PK STRL BLUE (TOWEL DISPOSABLE) ×2 IMPLANT
TOWEL OR 17X26 10 PK STRL BLUE (TOWEL DISPOSABLE) ×2 IMPLANT
TRAY LAPAROSCOPIC (CUSTOM PROCEDURE TRAY) ×2 IMPLANT
TROCAR XCEL BLUNT TIP 100MML (ENDOMECHANICALS) ×2 IMPLANT
TROCAR XCEL NON-BLD 11X100MML (ENDOMECHANICALS) ×2 IMPLANT
TROCAR XCEL NON-BLD 5MMX100MML (ENDOMECHANICALS) ×2 IMPLANT

## 2013-08-18 NOTE — Op Note (Signed)
08/18/2013  9:17 AM  PATIENT:  Elijah Watkins  47 y.o. male  PRE-OPERATIVE DIAGNOSIS:  Cholelithiasis  POST-OPERATIVE DIAGNOSIS:  Cholelithiasis with cholecystitis  PROCEDURE:  Procedure(s): LAPAROSCOPIC CHOLECYSTECTOMY WITH INTRAOPERATIVE CHOLANGIOGRAM (N/A)  SURGEON:  Surgeon(s) and Role:    * Robyne Askew, MD - Primary  PHYSICIAN ASSISTANT:   ASSISTANTS: Dr. Derrell Lolling   ANESTHESIA:   general  EBL:  Total I/O In: 1600 [I.V.:1600] Out: 50 [Blood:50]  BLOOD ADMINISTERED:none  DRAINS: none   LOCAL MEDICATIONS USED:  MARCAINE     SPECIMEN:  Source of Specimen:  gallbladder  DISPOSITION OF SPECIMEN:  PATHOLOGY  COUNTS:  YES  TOURNIQUET:  * No tourniquets in log *  DICTATION: .Dragon Dictation  Procedure: After informed consent was obtained the patient was brought to the operating room and placed in the supine position on the operating room table. After adequate induction of general anesthesia the patient's abdomen was prepped with ChloraPrep allowed to dry and draped in usual sterile manner. The area below the umbilicus was infiltrated with quarter percent  Marcaine. A small incision was made with a 15 blade knife. The incision was carried down through the subcutaneous tissue bluntly with a hemostat and [employer] retractors. The linea alba was identified. The linea alba was incised with a 15 blade knife and each side was grasped with Coker clamps. The preperitoneal space was then probed with a hemostat until the peritoneum was opened and access was gained to the abdominal cavity. A 0 Vicryl pursestring stitch was placed in the fascia surrounding the opening. A Hassan cannula was then placed through the opening and anchored in place with the previously placed Vicryl purse string stitch. The abdomen was insufflated with carbon dioxide without difficulty. A laparoscope was inserted through the [facility] cannula in the right upper quadrant was inspected. Next the epigastric region  was infiltrated with % Marcaine. A small incision was made with a 15 blade knife. A 10 mm port was placed bluntly through this incision into the abdominal cavity under direct vision. Next 2 sites were chosen laterally on the right side of the abdomen for placement of 5 mm ports. Each of these areas was infiltrated with quarter percent Marcaine. Small stab incisions were made with a 15 blade knife. 5 mm ports were then placed bluntly through these incisions into the abdominal cavity under direct vision without difficulty. A blunt grasper was placed through the lateralmost 5 mm port and used to grasp the dome of the gallbladder and elevated anteriorly and superiorly. Another blunt grasper was placed through the other 5 mm port and used to retract the body and neck of the gallbladder. A dissector was placed through the epigastric port and using the electrocautery the peritoneal reflection at the gallbladder neck was opened. Blunt dissection was then carried out in this area until the gallbladder neck-cystic duct junction was readily identified and a good window was created. A single clip was placed on the gallbladder neck. A small  ductotomy was made just below the clip with laparoscopic scissors. A 14-gauge Angiocath was then placed through the anterior abdominal wall under direct vision. A Reddick cholangiogram catheter was then placed through the Angiocath and flushed. The catheter was then placed in the cystic duct and anchored in place with a clip. A cholangiogram was obtained that showed no filling defects good emptying into the duodenum an adequate length on the cystic duct. The anchoring clip and catheters were then removed from the patient. 3 clips were  placed proximally on the cystic duct and the duct was divided between the 2 sets of clips. Posterior to this the cystic artery was identified and again dissected bluntly in a circumferential manner until a good window  was created. 2 clips were placed  proximally and one distally on the artery and the artery was divided between the 2 sets of clips. Next a laparoscopic hook cautery device was used to separate the gallbladder from the liver bed. Prior to completely detaching the gallbladder from the liver bed the liver bed was inspected and several small bleeding points were coagulated with the electrocautery until the area was completely hemostatic. The gallbladder was then detached the rest of it from the liver bed without difficulty. A laparoscopic bag was inserted through the epigastric port. The gallbladder was placed within the bag and the bag was sealed. A laparoscope was then moved to the epigastric port. The gallbladder grasper was placed through the [facility] cannula and used to grasp the opening of the bag. The bag with the gallbladder was then removed with the [facility] cannula through the infraumbilical port without difficulty. The fascial defect was then closed with the previously placed Vicryl pursestring stitch as well as with another figure-of-eight 0 Vicryl stitch. The liver bed was inspected again and found to be hemostatic. The abdomen was irrigated with copious amounts of saline until the effluent was clear. The ports were then removed under direct vision without difficulty and were found to be hemostatic. The gas was allowed to escape. The skin incisions were all closed with interrupted 4-0 Monocryl subcuticular stitches. Dermabond dressings were applied. The patient tolerated the procedure well. At the end of the case all needle sponge and instrument counts were correct. The patient was then awakened and taken to recovery in stable condition  PLAN OF CARE: Admit for overnight observation  PATIENT DISPOSITION:  PACU - hemodynamically stable.   Delay start of Pharmacological VTE agent (>24hrs) due to surgical blood loss or risk of bleeding: yes

## 2013-08-18 NOTE — H&P (View-Only) (Signed)
Patient ID: Elijah Watkins, male   DOB: 10/03/1966, 47 y.o.   MRN: [id number]  Chief Complaint  Patient presents with  . New Evaluation    eval GB    HPI Elijah Watkins is a 47 y.o. male.  We are asked to see the patient in consultation by Dr. Campos to evaluate him for gallstones. The patient is a 47-year-old white male who had his first episode of epigastric cramping and pain about 2 weeks ago. The pain was severe in nature. The pain was associated with nausea but no vomiting. The pain was so bad he went to the emergency department where a CT scan was done that showed stones in his gallbladder but no gallbladder wall thickening or duct dilatation. About 3 AM yesterday he had another episode of pain that doubled him over. He went back to the emergency department but the pain resolved while he was there. His liver functions were normal. His lipase was normal. He does have a history of coronary artery disease and stent placement and is followed by Dr. Walmeier at [facility]. He does take Plavix on a daily basis. HPI  Past Medical History  Diagnosis Date  . Hyperlipidemia   . CAD (coronary artery disease)   . Overweight(278.02)   . Pilonidal cyst 05/2012    area is open, not draining  . Anxiety   . Myocardial infarction     x 2   . S/P coronary artery stent placement 2001, 05/2011  . Hypertension     under control, has been on med. > 7 yrs.  . Seasonal allergies   . Dental crowns present   . Diabetes mellitus     IDDM  . Nephrolithiasis     states has 3 stones in kidney, but no current problems  . Anger     anger issues  . History of concussion   . History of peptic ulcer age 19  . History of DVT (deep vein thrombosis) 03/2005  . PONV (postoperative nausea and vomiting)     Past Surgical History  Procedure Laterality Date  . Kidney stone surgery  1988 - 1998    x 3  . Knee cartilage surgery  10/2003, 11/2003    Also had bursa removed  . Wrist surgery  02/1997     right  . Coronary angioplasty with stent placement      x 2; last time 05/2011  . Cardiac catheterization  10/06/2010  . Chondroplasty  08/04/2004    right knee  . Prepatellar bursa excision  12/05/2004    right  . Pilonidal cyst drainage  05/19/2012    Procedure: IRRIGATION AND DEBRIDEMENT PILONIDAL CYST;  Surgeon: Paul S Toth III, MD;  Location: [facility];  Service: General;  Laterality: N/A;  I & D Pilonidal abscess, placement of acell    Family History  Problem Relation Age of Onset  . Hypertension Father   . Alcohol abuse Father 70  . Diabetes Mother 65    Social History History  Substance Use Topics  . Smoking status: Former Smoker    Quit date: 02/24/1984  . Smokeless tobacco: Never Used  . Alcohol Use: Yes     Comment: rare    Allergies  Allergen Reactions  . Latex Hives and Swelling    Current Outpatient Prescriptions  Medication Sig Dispense Refill  . ALPRAZolam (XANAX) 1 MG tablet Take 1-2 mg by mouth 3 (three) times daily as needed for anxiety.       .   aspirin 81 MG tablet Take 81 mg by mouth daily.      . clopidogrel (PLAVIX) 75 MG tablet Take 75 mg by mouth daily.      . Fe Fum-FePoly-Vit C-Vit B3 (INTEGRA PO) Take 1 capsule by mouth daily.      . gabapentin (NEURONTIN) 600 MG tablet TAKE 1 TABLET BY MOUTH 3 TIMES A DAY  90 tablet  2  . gemfibrozil (LOPID) 600 MG tablet Take 600 mg by mouth 2 (two) times daily before a meal.      . glucosamine-chondroitin 500-400 MG tablet Take 2 tablets by mouth at bedtime.      . insulin NPH (HUMULIN N,NOVOLIN N) 100 UNIT/ML injection Inject 63-74 Units into the skin 2 (two) times daily. Takes 74 units in the morning, 63 at night      . KOMBIGLYZE XR 2.04-999 MG TB24 Take 2 tablets by mouth at bedtime. PM      . metoprolol (LOPRESSOR) 50 MG tablet Take 50 mg by mouth 2 (two) times daily.      . nitroGLYCERIN (NITROSTAT) 0.4 MG SL tablet Place 0.4 mg under the tongue every 5 (five) minutes as needed.       . ondansetron (ZOFRAN ODT) 8 MG disintegrating tablet Take 1 tablet (8 mg total) by mouth every 8 (eight) hours as needed for nausea.  10 tablet  0  . oxyCODONE-acetaminophen (PERCOCET/ROXICET) 5-325 MG per tablet Take 1 tablet by mouth every 4 (four) hours as needed for pain.  25 tablet  0  . pravastatin (PRAVACHOL) 80 MG tablet Take 80 mg by mouth daily.      . zolpidem (AMBIEN) 10 MG tablet Take 10 mg by mouth at bedtime as needed for sleep.       No current facility-administered medications for this visit.    Review of Systems Review of Systems  Constitutional: Negative.   HENT: Negative.   Eyes: Negative.   Respiratory: Negative.   Cardiovascular: Negative.   Gastrointestinal: Positive for nausea and abdominal pain.  Endocrine: Negative.   Genitourinary: Negative.   Musculoskeletal: Negative.   Skin: Negative.   Allergic/Immunologic: Negative.   Neurological: Negative.   Hematological: Negative.   Psychiatric/Behavioral: Negative.     Blood pressure 118/80, pulse 74, resp. rate 14, height 5' 11" (1.803 m), weight 272 lb 12.8 oz (123.741 kg).  Physical Exam Physical Exam  Constitutional: He is oriented to person, place, and time. He appears well-developed and well-nourished.  HENT:  Head: Normocephalic and atraumatic.  Eyes: Conjunctivae and EOM are normal. Pupils are equal, round, and reactive to light.  Neck: Normal range of motion. Neck supple.  Cardiovascular: Normal rate, regular rhythm and normal heart sounds.   Pulmonary/Chest: Effort normal and breath sounds normal.  Abdominal: Soft. Bowel sounds are normal.  Musculoskeletal: Normal range of motion.  Neurological: He is alert and oriented to person, place, and time.  Skin: Skin is warm and dry.  Psychiatric: He has a normal mood and affect. His behavior is normal.    Data Reviewed As above  Assessment    The patient appears to have symptomatic gallstones. Because of the risk of further painful episodes  and possible pancreatitis I think he would benefit from having his gallbladder removed. I've discussed with him in detail the risks and benefits of the operation to remove the gallbladder as well as some of the technical aspects and he understands and wishes to proceed. He will need cardiac clearance and also need to be   off his Plavix for 5 days prior to surgery.     Plan    Plan for laparoscopic cholecystectomy with intraoperative cholangiogram once we have cardiac clearance        TOTH III,PAUL S 08/02/2013, 4:00 PM    

## 2013-08-18 NOTE — Transfer of Care (Signed)
Immediate Anesthesia Transfer of Care Note  Patient: Elijah Watkins  Procedure(s) Performed: Procedure(s): LAPAROSCOPIC CHOLECYSTECTOMY WITH INTRAOPERATIVE CHOLANGIOGRAM (N/A)  Patient Location: PACU  Anesthesia Type:General  Level of Consciousness: sedated  Airway & Oxygen Therapy: Patient Spontanous Breathing and Patient connected to face mask oxygen  Post-op Assessment: Report given to PACU RN, Post -op Vital signs reviewed and stable and Patient moving all extremities X 4  Post vital signs: Reviewed and stable  Complications: No apparent anesthesia complications

## 2013-08-18 NOTE — Preoperative (Signed)
Beta Blockers   Reason not to administer Beta Blockers:Not Applicable 

## 2013-08-18 NOTE — Interval H&P Note (Signed)
History and Physical Interval Note:  08/18/2013 6:25 AM  Elijah Watkins  has presented today for surgery, with the diagnosis of gallstone  The various methods of treatment have been discussed with the patient and family. After consideration of risks, benefits and other options for treatment, the patient has consented to  Procedure(s): LAPAROSCOPIC CHOLECYSTECTOMY WITH INTRAOPERATIVE CHOLANGIOGRAM (N/A) as a surgical intervention .  The patient's history has been reviewed, patient examined, no change in status, stable for surgery.  I have reviewed the patient's chart and labs.  Questions were answered to the patient's satisfaction.     TOTH III,PAUL S

## 2013-08-18 NOTE — Anesthesia Procedure Notes (Signed)
Procedure Name: Intubation Date/Time: 08/18/2013 7:47 AM Performed by: Carmela Rima Pre-anesthesia Checklist: Timeout performed, Patient identified, Emergency Drugs available, Suction available and Patient being monitored Patient Re-evaluated:Patient Re-evaluated prior to inductionOxygen Delivery Method: Circle system utilized Preoxygenation: Pre-oxygenation with 100% oxygen Intubation Type: IV induction Ventilation: Mask ventilation without difficulty Laryngoscope Size: Mac and 4 Grade View: Grade III Tube type: Oral Tube size: 7.5 mm Number of attempts: 2 Airway Equipment and Method: Video-laryngoscopy Placement Confirmation: ETT inserted through vocal cords under direct vision,  positive ETCO2 and breath sounds checked- equal and bilateral Secured at: 23 cm Tube secured with: Tape Dental Injury: Teeth and Oropharynx as per pre-operative assessment

## 2013-08-18 NOTE — Anesthesia Postprocedure Evaluation (Signed)
  Anesthesia Post-op Note  Patient: Elijah Watkins  Procedure(s) Performed: Procedure(s): LAPAROSCOPIC CHOLECYSTECTOMY WITH INTRAOPERATIVE CHOLANGIOGRAM (N/A)  Patient Location: PACU  Anesthesia Type:General  Level of Consciousness: awake, alert , oriented and patient cooperative  Airway and Oxygen Therapy: Patient Spontanous Breathing and Patient connected to nasal cannula oxygen  Post-op Pain: mild  Post-op Assessment: Post-op Vital signs reviewed, Patient's Cardiovascular Status Stable, Respiratory Function Stable, Patent Airway, No signs of Nausea or vomiting and Pain level controlled  Post-op Vital Signs: Reviewed and stable  Complications: No apparent anesthesia complications

## 2013-08-18 NOTE — Anesthesia Preprocedure Evaluation (Addendum)
Anesthesia Evaluation  Patient identified by MRN, date of birth, ID band Patient awake    Reviewed: Allergy & Precautions, H&P , NPO status , Patient's Chart, lab work & pertinent test results, reviewed documented beta blocker date and time   History of Anesthesia Complications (+) PONV  Airway Mallampati: II TM Distance: >3 FB Neck ROM: full    Dental  (+) Teeth Intact and Dental Advidsory Given   Pulmonary pneumonia -, resolved,  breath sounds clear to auscultation  Pulmonary exam normal       Cardiovascular hypertension, On Home Beta Blockers + CAD, + Past MI and + Cardiac Stents Rhythm:Regular Rate:Normal  '11 ECHO: EF 50-55%, valves OK   Neuro/Psych PSYCHIATRIC DISORDERS negative neurological ROS     GI/Hepatic Neg liver ROS, GERD-  Controlled,  Endo/Other  diabetes, Type 1, Oral Hypoglycemic Agents and Insulin DependentMorbid obesity  Renal/GU Renal InsufficiencyRenal disease (creat 1.69)     Musculoskeletal   Abdominal (+) + obese,   Peds  Hematology   Anesthesia Other Findings Renal Insufficiency.    Reproductive/Obstetrics                         Anesthesia Physical Anesthesia Plan  ASA: III  Anesthesia Plan: General   Post-op Pain Management:    Induction: Intravenous  Airway Management Planned: Oral ETT and Video Laryngoscope Planned  Additional Equipment:   Intra-op Plan:   Post-operative Plan: Extubation in OR  Informed Consent: I have reviewed the patients History and Physical, chart, labs and discussed the procedure including the risks, benefits and alternatives for the proposed anesthesia with the patient or authorized representative who has indicated his/her understanding and acceptance.   Dental Advisory Given  Plan Discussed with: Surgeon, CRNA and Anesthesiologist  Anesthesia Plan Comments: (Plan routine monitors, GETA)       Anesthesia Quick  Evaluation

## 2013-08-19 MED ORDER — OXYCODONE-ACETAMINOPHEN 5-325 MG PO TABS: 1.0000 | ORAL_TABLET | ORAL | Status: DC | PRN

## 2013-08-19 NOTE — Progress Notes (Signed)
1 Day Post-Op   Assessment: s/p Procedure(s): LAPAROSCOPIC CHOLECYSTECTOMY WITH INTRAOPERATIVE CHOLANGIOGRAM Patient Active Problem List   Diagnosis Date Noted  . Gallstones 08/02/2013  . Diabetes mellitus 02/24/2012  . Myocardial infarct, old 02/24/2012  . Coronary artery disease 02/24/2012  . Hypertension 02/24/2012  . Gout 02/24/2012  . Obesity (BMI 30-39.9) 02/24/2012  . Depression 02/24/2012  . Pilonidal abscess 02/24/2012  . PAIN IN LIMB 01/01/2011    Doing well post op lap chole, ready to go home  Plan: Discharge  Subjective: Feels OK and wants to go home, no nausea, ate a full breakfast, minimal pain  Objective: Vital signs in last 24 hours: Temp:  [96.9 F (36.1 C)-99.9 F (37.7 C)] 99.4 F (37.4 C) (09/06 0519) Pulse Rate:  [64-97] 93 (09/06 0519) Resp:  [11-21] 20 (09/06 0519) BP: (87-136)/(51-78) 118/66 mmHg (09/06 0519) SpO2:  [94 %-99 %] 96 % (09/06 0519) Weight:  [276 lb (125.193 kg)] 276 lb (125.193 kg) (09/05 1245)   Intake/Output from previous day: 09/05 0701 - 09/06 0700 In: 2775 [I.V.:2775] Out: 50 [Blood:50]  General appearance: alert, cooperative and no distress Resp: clear to auscultation bilaterally Cardio: regular rate and rhythm, S1, S2 normal, no murmur, click, rub or gallop GI: soft, non-tender; bowel sounds normal; no masses,  no organomegaly  Incision: healing well  Lab Results:  No results found for this basename: WBC, HGB, HCT, PLT,  in the last 72 hours BMET  Recent Labs  08/18/13 0636  CREATININE 1.69*    MEDS, Scheduled . aspirin  81 mg Oral Daily  . clopidogrel  75 mg Oral QAC breakfast  . gabapentin  600 mg Oral Q8H  . gemfibrozil  600 mg Oral BID AC  . insulin NPH  63 Units Subcutaneous QHS  . insulin NPH  74 Units Subcutaneous QAC breakfast  . linagliptin  5 mg Oral QHS  . metoprolol  50 mg Oral BID  . rosuvastatin  10 mg Oral q1800  . zolpidem  10 mg Oral QHS    Studies/Results: Dg Cholangiogram  Operative  08/18/2013   *RADIOLOGY REPORT*  Clinical Data:  Cholelithiasis  INTRAOPERATIVE CHOLANGIOGRAM  Technique:  Multiple fluoroscopic spot radiographs were obtained during intraoperative cholangiogram and are submitted for interpretation post-operatively.  Comparison: None.  Findings: Gallbladder is been removed, and the cystic duct has been cannulated.  The intrahepatic and extrahepatic biliary ducts appear normal.  No mass or calculus seen.  There is apparent free flow of contrast via the common bile duct into the duodenum.  IMPRESSION: Study within normal limits.  No obstructing lesion.   Original Report Authenticated By: Bretta Bang, M.D.      LOS: 1 day     Currie Paris, MD, [facility], [place] [phone]   08/19/2013 9:17 AM

## 2013-08-19 NOTE — Progress Notes (Signed)
DC home with wife, verbally understood DC instructions, no questions asked. 

## 2013-08-21 ENCOUNTER — Encounter (HOSPITAL_COMMUNITY): Payer: Self-pay | Admitting: General Surgery

## 2013-08-22 ENCOUNTER — Encounter (HOSPITAL_COMMUNITY): Payer: Self-pay

## 2013-08-22 ENCOUNTER — Ambulatory Visit (HOSPITAL_COMMUNITY): Admit: 2013-08-22 | Payer: Self-pay | Admitting: Gastroenterology

## 2013-08-22 SURGERY — COLONOSCOPY WITH PROPOFOL
Anesthesia: Monitor Anesthesia Care

## 2013-09-06 ENCOUNTER — Ambulatory Visit (INDEPENDENT_AMBULATORY_CARE_PROVIDER_SITE_OTHER): Payer: BC Managed Care – PPO | Admitting: General Surgery

## 2013-09-06 ENCOUNTER — Encounter (INDEPENDENT_AMBULATORY_CARE_PROVIDER_SITE_OTHER): Payer: Self-pay | Admitting: General Surgery

## 2013-09-06 VITALS — BP 130/82 | HR 76 | Temp 98.0°F | Resp 18 | Ht 72.0 in | Wt 269.0 lb

## 2013-09-06 DIAGNOSIS — K8020 Calculus of gallbladder without cholecystitis without obstruction: Secondary | ICD-10-CM

## 2013-09-06 NOTE — Progress Notes (Signed)
Subjective:     Patient ID: Elijah Watkins, male   DOB: Dec 03, 1966, 47 y.o.   MRN: [id number]  HPI The patient is a 47 year old white male who is about 3 weeks status post laparoscopic cholecystectomy for cholecystitis and cholelithiasis. He tolerated the surgery well. He stayed home for about a week before returning to work. He is trying to protect his abdomen at work as best he can. His appetite has been good. His bowels are a little more frequent than they were before surgery but very manageable.  Review of Systems     Objective:   Physical Exam On exam his abdomen is soft and nontender. His incisions are all healing nicely with no sign of infection.    Assessment:     The patient is 3 weeks status post laparoscopic cholecystectomy     Plan:     At this point I would like him to try to refrain from any heavy lifting for another 3 weeks. We will plan to see him back on a when necessary basis.

## 2013-09-06 NOTE — Patient Instructions (Signed)
May return to all normal activities in about 3 weeks

## 2013-09-19 ENCOUNTER — Emergency Department (HOSPITAL_COMMUNITY): Payer: BC Managed Care – PPO

## 2013-09-19 ENCOUNTER — Encounter (HOSPITAL_COMMUNITY): Payer: Self-pay | Admitting: Emergency Medicine

## 2013-09-19 ENCOUNTER — Telehealth (INDEPENDENT_AMBULATORY_CARE_PROVIDER_SITE_OTHER): Payer: Self-pay

## 2013-09-19 ENCOUNTER — Emergency Department (HOSPITAL_COMMUNITY)
Admission: EM | Admit: 2013-09-19 | Discharge: 2013-09-19 | Disposition: A | Discharge status: Home / Self Care | Payer: BC Managed Care – PPO | Attending: Emergency Medicine | Admitting: Emergency Medicine

## 2013-09-19 DIAGNOSIS — Z86718 Personal history of other venous thrombosis and embolism: Secondary | ICD-10-CM | POA: Insufficient documentation

## 2013-09-19 DIAGNOSIS — Z98811 Dental restoration status: Secondary | ICD-10-CM | POA: Insufficient documentation

## 2013-09-19 DIAGNOSIS — Z8711 Personal history of peptic ulcer disease: Secondary | ICD-10-CM | POA: Insufficient documentation

## 2013-09-19 DIAGNOSIS — F411 Generalized anxiety disorder: Secondary | ICD-10-CM | POA: Insufficient documentation

## 2013-09-19 DIAGNOSIS — Z8709 Personal history of other diseases of the respiratory system: Secondary | ICD-10-CM | POA: Insufficient documentation

## 2013-09-19 DIAGNOSIS — Z9889 Other specified postprocedural states: Secondary | ICD-10-CM | POA: Insufficient documentation

## 2013-09-19 DIAGNOSIS — E119 Type 2 diabetes mellitus without complications: Secondary | ICD-10-CM | POA: Insufficient documentation

## 2013-09-19 DIAGNOSIS — I2510 Atherosclerotic heart disease of native coronary artery without angina pectoris: Secondary | ICD-10-CM | POA: Insufficient documentation

## 2013-09-19 DIAGNOSIS — E663 Overweight: Secondary | ICD-10-CM | POA: Insufficient documentation

## 2013-09-19 DIAGNOSIS — Z794 Long term (current) use of insulin: Secondary | ICD-10-CM | POA: Insufficient documentation

## 2013-09-19 DIAGNOSIS — M129 Arthropathy, unspecified: Secondary | ICD-10-CM | POA: Insufficient documentation

## 2013-09-19 DIAGNOSIS — Z9861 Coronary angioplasty status: Secondary | ICD-10-CM | POA: Insufficient documentation

## 2013-09-19 DIAGNOSIS — E785 Hyperlipidemia, unspecified: Secondary | ICD-10-CM | POA: Insufficient documentation

## 2013-09-19 DIAGNOSIS — I252 Old myocardial infarction: Secondary | ICD-10-CM | POA: Insufficient documentation

## 2013-09-19 DIAGNOSIS — Z87891 Personal history of nicotine dependence: Secondary | ICD-10-CM | POA: Insufficient documentation

## 2013-09-19 DIAGNOSIS — Z7982 Long term (current) use of aspirin: Secondary | ICD-10-CM | POA: Insufficient documentation

## 2013-09-19 DIAGNOSIS — N200 Calculus of kidney: Secondary | ICD-10-CM | POA: Insufficient documentation

## 2013-09-19 DIAGNOSIS — Z87820 Personal history of traumatic brain injury: Secondary | ICD-10-CM | POA: Insufficient documentation

## 2013-09-19 DIAGNOSIS — Z79899 Other long term (current) drug therapy: Secondary | ICD-10-CM | POA: Insufficient documentation

## 2013-09-19 DIAGNOSIS — Z91040 Latex allergy status: Secondary | ICD-10-CM | POA: Insufficient documentation

## 2013-09-19 DIAGNOSIS — Z8701 Personal history of pneumonia (recurrent): Secondary | ICD-10-CM | POA: Insufficient documentation

## 2013-09-19 DIAGNOSIS — I10 Essential (primary) hypertension: Secondary | ICD-10-CM | POA: Insufficient documentation

## 2013-09-19 LAB — URINALYSIS, ROUTINE W REFLEX MICROSCOPIC
Protein, ur: 30 mg/dL — AB
Specific Gravity, Urine: 1.02 (ref 1.005–1.030)
Urobilinogen, UA: 0.2 mg/dL (ref 0.0–1.0)

## 2013-09-19 LAB — URINE MICROSCOPIC-ADD ON

## 2013-09-19 MED ORDER — KETOROLAC TROMETHAMINE 60 MG/2ML IM SOLN
60.0000 mg | Freq: Once | INTRAMUSCULAR | Status: AC
  Administered 2013-09-19: 60 mg via INTRAMUSCULAR
  Filled 2013-09-19: qty 2

## 2013-09-19 MED ORDER — HYDROMORPHONE HCL PF 1 MG/ML IJ SOLN
1.0000 mg | Freq: Once | INTRAMUSCULAR | Status: AC
  Administered 2013-09-19: 1 mg via INTRAMUSCULAR
  Filled 2013-09-19: qty 1

## 2013-09-19 MED ORDER — TAMSULOSIN HCL 0.4 MG PO CAPS: 0.4000 mg | ORAL_CAPSULE | Freq: Every day | ORAL | Status: DC

## 2013-09-19 MED ORDER — ONDANSETRON 8 MG PO TBDP: 8.0000 mg | ORAL_TABLET | Freq: Three times a day (TID) | ORAL | Status: DC | PRN

## 2013-09-19 MED ORDER — OXYCODONE-ACETAMINOPHEN 5-325 MG PO TABS
2.0000 | ORAL_TABLET | Freq: Once | ORAL | Status: AC
  Administered 2013-09-19: 2 via ORAL
  Filled 2013-09-19: qty 2

## 2013-09-19 MED ORDER — OXYCODONE-ACETAMINOPHEN 7.5-325 MG PO TABS: 1.0000 | ORAL_TABLET | ORAL | Status: DC | PRN

## 2013-09-19 NOTE — Telephone Encounter (Signed)
Pt called stating he has had 2 episodes of loose stools with some left lower back pain today. No fever. No vomiting. Voiding well. Pt had sloppy joes,kale slaw and milk products. Pt advised fatty high residue diet for the first few weeks after surgery may cause some GI difficulty.  I advised pt to go to low fat,low residue diet for 24 hours to see if symptoms resolve. Pt advised to call if ruq pain,vomiting and or fever occur. Pt states he understands.

## 2013-09-19 NOTE — ED Provider Notes (Signed)
CSN: [id number]     Arrival date & time 09/19/13  1809 History   First MD Initiated Contact with Patient 09/19/13 1821     Chief Complaint  Patient presents with  . Back Pain   (Consider location/radiation/quality/duration/timing/severity/associated sxs/prior Treatment) Patient is a 47 y.o. male presenting with back pain. The history is provided by the patient.  Back Pain  patient here complaining of right-sided flank pain that started yesterday which radiates to his right groin. Pain is been colicky he does have a history of kidney stones and this is similar. Also notes dark urine as well 2. No fever or chills. No change in his bowel or bladder habits.. Denies any radicular symptoms. No treatment used prior to arrival. Denies any rashes to his flank area.  Past Medical History  Diagnosis Date  . Hyperlipidemia   . CAD (coronary artery disease)   . Overweight(278.02)   . Pilonidal cyst 05/2012    area is open, not draining  . Anxiety   . Myocardial infarction     x 2   . S/P coronary artery stent placement 2001, 05/2011  . Hypertension     under control, has been on med. > 7 yrs.  . Seasonal allergies   . Dental crowns present   . Nephrolithiasis     states has 3 stones in kidney, but no current problems  . Anger     anger issues  . History of concussion   . History of peptic ulcer age 48  . History of DVT (deep vein thrombosis) 03/2005  . PONV (postoperative nausea and vomiting)   . Pneumonia     hx  . Diabetes mellitus     IDDM - fasting 70-120s  . GERD (gastroesophageal reflux disease)   . Arthritis    Past Surgical History  Procedure Laterality Date  . Kidney stone surgery  1988 - 1998    x 3  . Knee cartilage surgery  10/2003, 11/2003    Also had bursa removed  . Wrist surgery  02/1997    right  . Coronary angioplasty with stent placement      x 2; last time 05/2011  . Cardiac catheterization  10/06/2010  . Chondroplasty  08/04/2004    right knee  . Prepatellar  bursa excision  12/05/2004    right  . Pilonidal cyst drainage  05/19/2012    Procedure: IRRIGATION AND DEBRIDEMENT PILONIDAL CYST;  Surgeon: Robyne Askew, MD;  Location: [facility];  Service: General;  Laterality: N/A;  I & D Pilonidal abscess, placement of acell  . Knee arthroscopic      left knee  . Cholecystectomy N/A 08/18/2013    Procedure: LAPAROSCOPIC CHOLECYSTECTOMY WITH INTRAOPERATIVE CHOLANGIOGRAM;  Surgeon: Robyne Askew, MD;  Location: [facility];  Service: General;  Laterality: N/A;   Family History  Problem Relation Age of Onset  . Hypertension Father   . Alcohol abuse Father 54  . Diabetes Mother 26   History  Substance Use Topics  . Smoking status: Former Smoker    Quit date: 02/24/1984  . Smokeless tobacco: Never Used  . Alcohol Use: Yes     Comment: rare    Review of Systems  Musculoskeletal: Positive for back pain.  All other systems reviewed and are negative.    Allergies  Latex  Home Medications   Current Outpatient Rx  Name  Route  Sig  Dispense  Refill  . ALPRAZolam (XANAX) 1 MG tablet  Oral   Take 1-2 mg by mouth 3 (three) times daily as needed for anxiety.          Marland Kitchen aspirin 81 MG tablet   Oral   Take 81 mg by mouth daily.         . calcium carbonate (TUMS EX) 750 MG chewable tablet   Oral   Chew 2 tablets by mouth 2 (two) times daily as needed for heartburn.         . Fe Fum-FePoly-Vit C-Vit B3 (INTEGRA PO)   Oral   Take 1 capsule by mouth daily.         Marland Kitchen gabapentin (NEURONTIN) 600 MG tablet   Oral   Take 600 mg by mouth 2 (two) times daily.          Marland Kitchen gemfibrozil (LOPID) 600 MG tablet   Oral   Take 600 mg by mouth 2 (two) times daily before a meal.         . glucosamine-chondroitin 500-400 MG tablet   Oral   Take 2 tablets by mouth at bedtime.         . insulin NPH (HUMULIN N,NOVOLIN N) 100 UNIT/ML injection   Subcutaneous   Inject 63-73 Units into the skin 2 (two) times daily. Takes 73 units in  the morning, 63 at night         . KOMBIGLYZE XR 2.04-999 MG TB24   Oral   Take 2 tablets by mouth at bedtime. PM         . metoprolol (LOPRESSOR) 50 MG tablet   Oral   Take 50 mg by mouth 2 (two) times daily.         . nitroGLYCERIN (NITROSTAT) 0.4 MG SL tablet   Sublingual   Place 0.4 mg under the tongue every 5 (five) minutes as needed. x3 doses as needed for chest pain         . ondansetron (ZOFRAN ODT) 8 MG disintegrating tablet   Oral   Take 1 tablet (8 mg total) by mouth every 8 (eight) hours as needed for nausea.   10 tablet   0   . oxyCODONE-acetaminophen (PERCOCET/ROXICET) 5-325 MG per tablet   Oral   Take 1 tablet by mouth every 4 (four) hours as needed for pain.   25 tablet   0   . pravastatin (PRAVACHOL) 80 MG tablet   Oral   Take 80 mg by mouth daily.         Marland Kitchen zolpidem (AMBIEN) 10 MG tablet   Oral   Take 10 mg by mouth at bedtime.           BP 144/80  Pulse 68  Temp(Src) 98.4 F (36.9 C) (Axillary)  Resp 14  SpO2 100% Physical Exam  Nursing note and vitals reviewed. Constitutional: He is oriented to person, place, and time. He appears well-developed and well-nourished.  Non-toxic appearance. No distress.  HENT:  Head: Normocephalic and atraumatic.  Eyes: Conjunctivae, EOM and lids are normal. Pupils are equal, round, and reactive to light.  Neck: Normal range of motion. Neck supple. No tracheal deviation present. No mass present.  Cardiovascular: Normal rate, regular rhythm and normal heart sounds.  Exam reveals no gallop.   No murmur heard. Pulmonary/Chest: Effort normal and breath sounds normal. No stridor. No respiratory distress. He has no decreased breath sounds. He has no wheezes. He has no rhonchi. He has no rales.  Abdominal: Soft. Normal appearance and bowel sounds are normal. He  exhibits no distension. There is no tenderness. There is CVA tenderness. There is no rigidity, no rebound and no guarding.  Musculoskeletal: Normal  range of motion. He exhibits no edema and no tenderness.  Neurological: He is alert and oriented to person, place, and time. He has normal strength. No cranial nerve deficit or sensory deficit. GCS eye subscore is 4. GCS verbal subscore is 5. GCS motor subscore is 6.  Skin: Skin is warm and dry. No abrasion and no rash noted.  Psychiatric: He has a normal mood and affect. His speech is normal and behavior is normal.    ED Course  Procedures (including critical care time) Labs Review Labs Reviewed  URINALYSIS, ROUTINE W REFLEX MICROSCOPIC   Imaging Review No results found.  MDM  No diagnosis found. Patient given pain meds for his renal colic and does flow better. He does have a 7 mm stone and will call the urologist tomorrow    Toy Baker, MD 09/19/13 2009

## 2013-09-19 NOTE — ED Notes (Signed)
MD at bedside at this time.

## 2013-09-19 NOTE — ED Notes (Signed)
PT c/o of right flank pain radiating around towards groin. States that he had gallbladder surgery a month ago. Pain 10/10 Hx of kidney stones. Denies n/v/d.

## 2013-10-27 ENCOUNTER — Encounter (INDEPENDENT_AMBULATORY_CARE_PROVIDER_SITE_OTHER): Payer: Self-pay

## 2013-12-26 IMAGING — CR DG CHEST 2V
2 series · 2 of 2 positions shown · non-contrast
Comparison: 05/17/2012

CLINICAL DATA: Preoperative evaluation for a left knee surgery.
History of stents.  Hypertension and diabetes.  Nonsmoker

CHEST - 2 VIEW

[w chest pa]
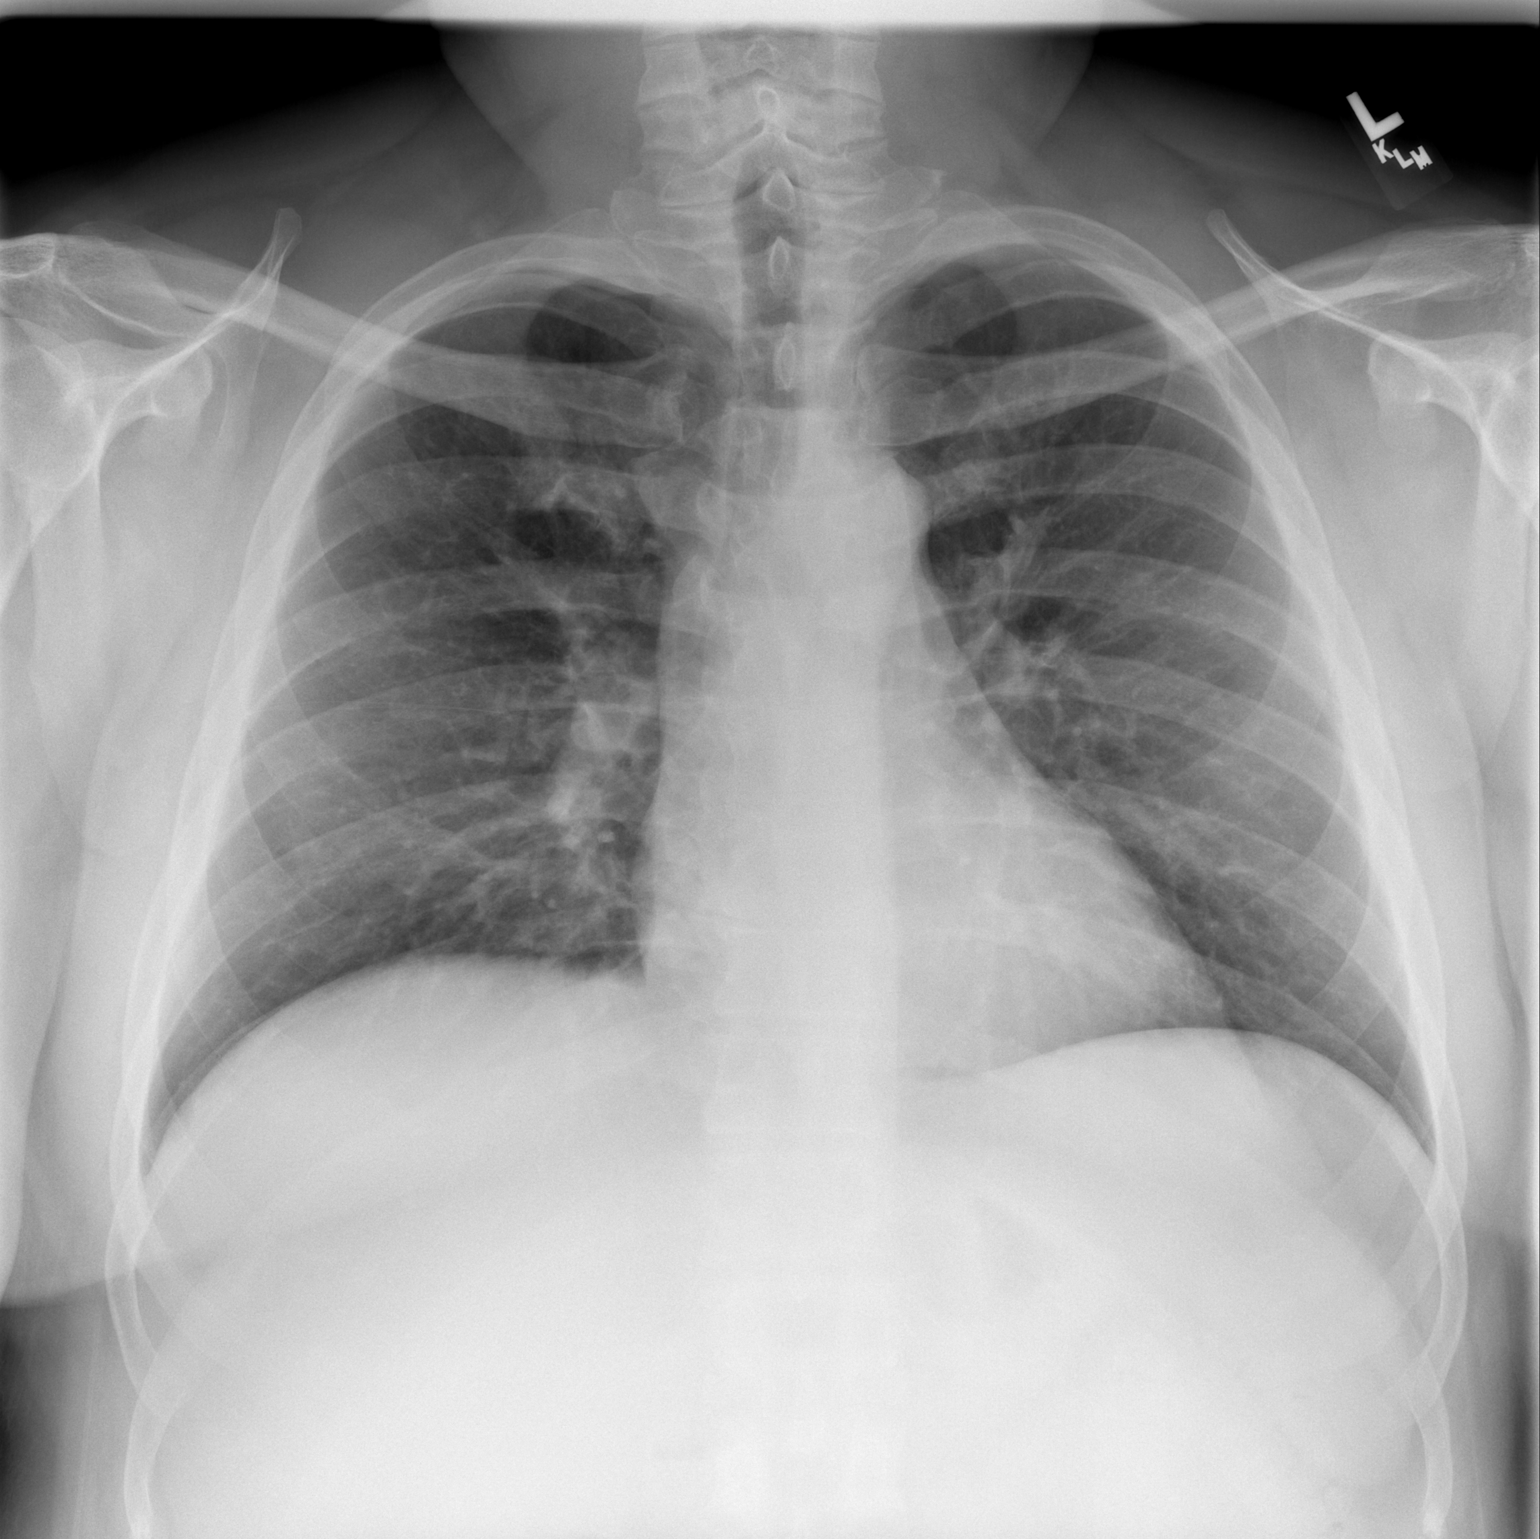

[w chest lat]
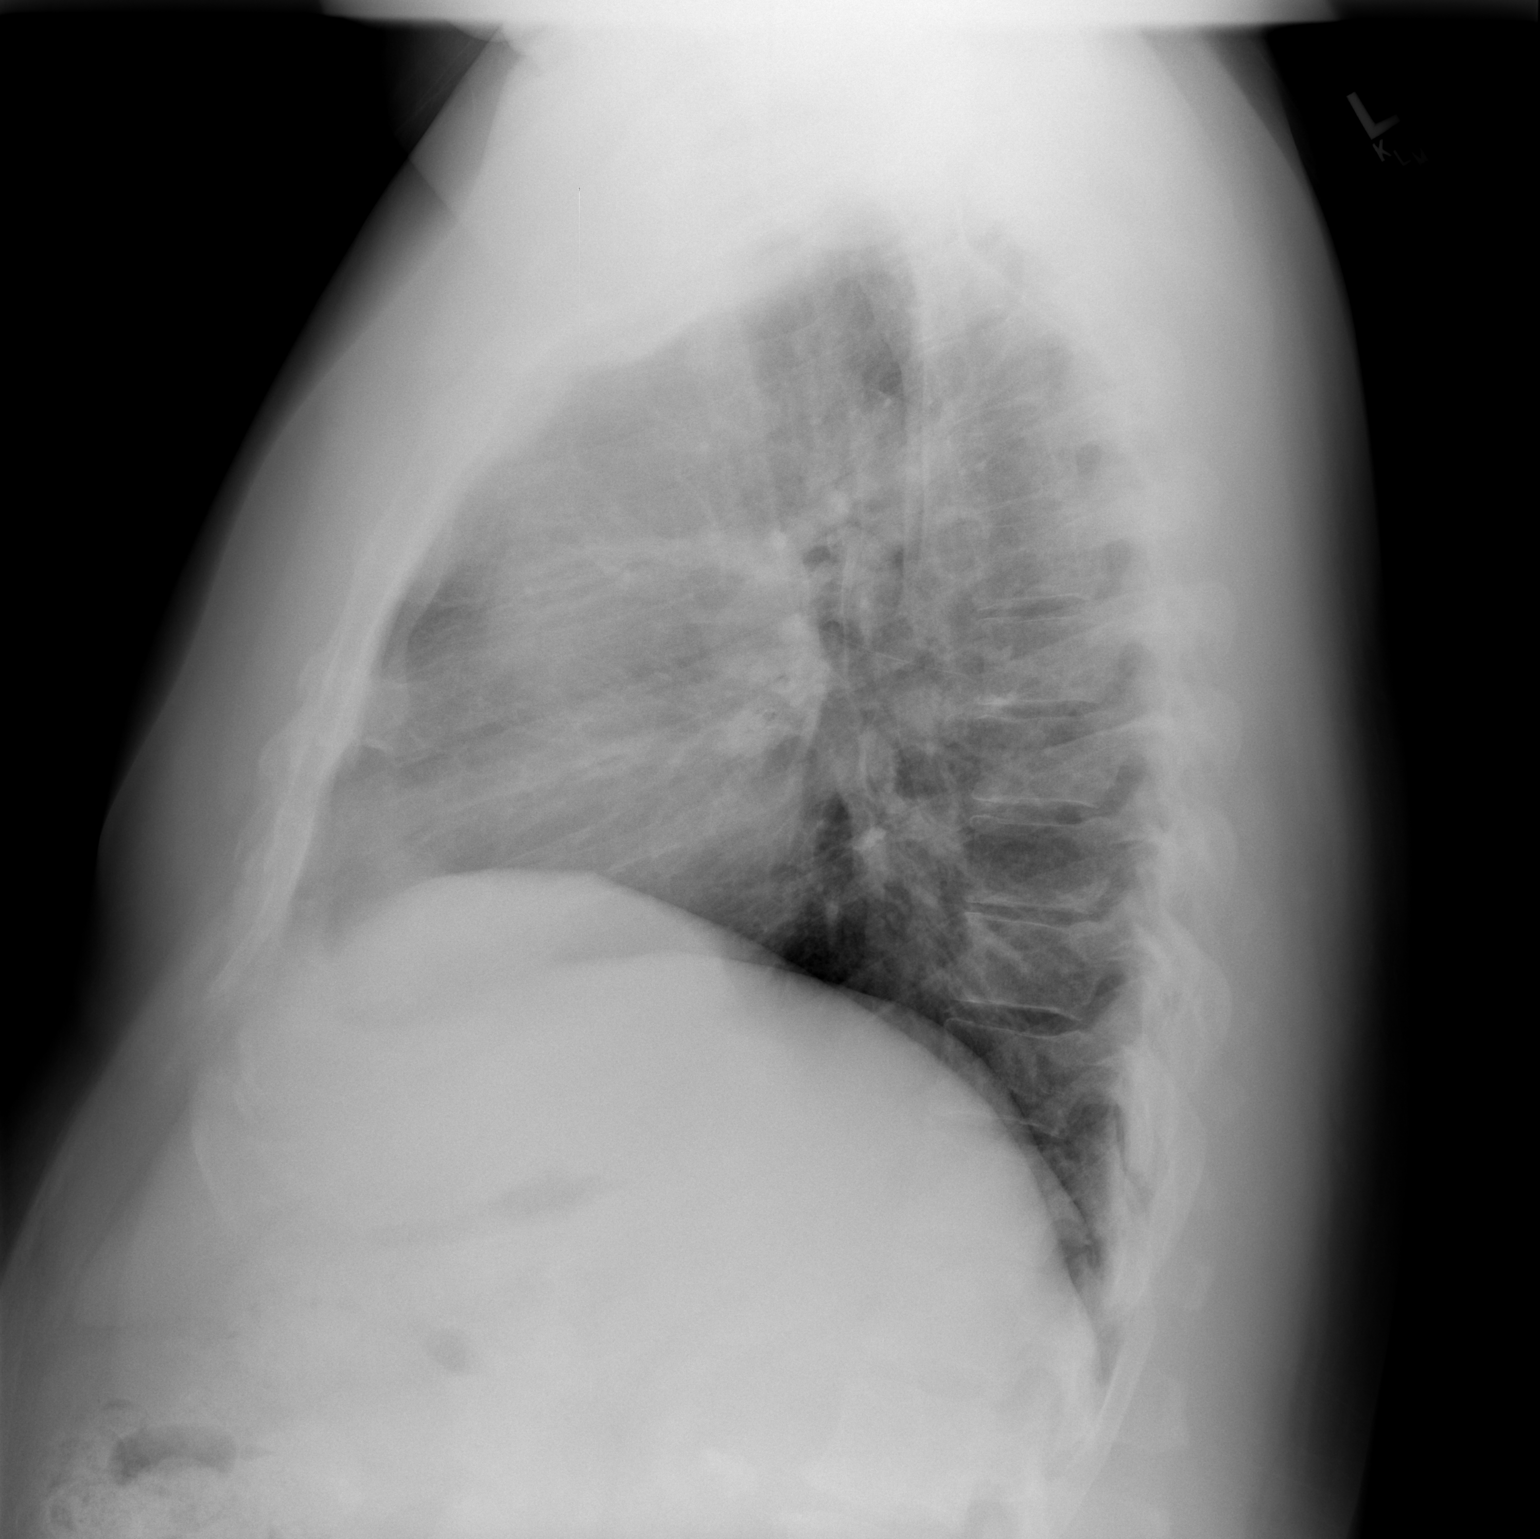

[2 of 2 positions shown; findings below may reference images not displayed]

FINDINGS: Heart and mediastinal contours are within normal limits.
The lung fields appear clear with no signs of focal infiltrate or
congestive failure.  No pleural fluid or significant peribronchial
cuffing is identified.

Bony structures appear intact
IMPRESSION: Stable cardiopulmonary appearance with no new focal or acute
abnormality seen

## 2014-03-14 ENCOUNTER — Ambulatory Visit: Payer: BC Managed Care – PPO | Attending: Internal Medicine | Admitting: Occupational Therapy

## 2014-03-14 DIAGNOSIS — R279 Unspecified lack of coordination: Secondary | ICD-10-CM | POA: Insufficient documentation

## 2014-03-14 DIAGNOSIS — IMO0001 Reserved for inherently not codable concepts without codable children: Secondary | ICD-10-CM | POA: Insufficient documentation

## 2014-03-14 DIAGNOSIS — M25549 Pain in joints of unspecified hand: Secondary | ICD-10-CM | POA: Insufficient documentation

## 2014-03-20 ENCOUNTER — Ambulatory Visit: Payer: BC Managed Care – PPO

## 2014-03-22 ENCOUNTER — Ambulatory Visit: Payer: BC Managed Care – PPO

## 2014-03-28 ENCOUNTER — Ambulatory Visit: Payer: BC Managed Care – PPO | Admitting: Occupational Therapy

## 2014-03-29 ENCOUNTER — Ambulatory Visit: Payer: BC Managed Care – PPO | Admitting: Occupational Therapy

## 2014-04-02 ENCOUNTER — Ambulatory Visit: Payer: BC Managed Care – PPO | Admitting: Occupational Therapy

## 2014-04-04 ENCOUNTER — Encounter: Payer: BC Managed Care – PPO | Admitting: Occupational Therapy

## 2014-04-09 ENCOUNTER — Ambulatory Visit: Payer: BC Managed Care – PPO | Admitting: Occupational Therapy

## 2014-04-11 ENCOUNTER — Encounter: Payer: BC Managed Care – PPO | Admitting: Occupational Therapy

## 2014-07-11 IMAGING — RF DG CHOLANGIOGRAM OPERATIVE
1 series · 7 of 7 positions shown · non-contrast
Comparison: None.

CLINICAL DATA: Cholelithiasis

INTRAOPERATIVE CHOLANGIOGRAM
TECHNIQUE: Multiple fluoroscopic spot radiographs were obtained
during intraoperative cholangiogram and are submitted for
interpretation post-operatively.

[Series 1: run · 4 acquisitions, 7 frames shown]
[im 1/4]
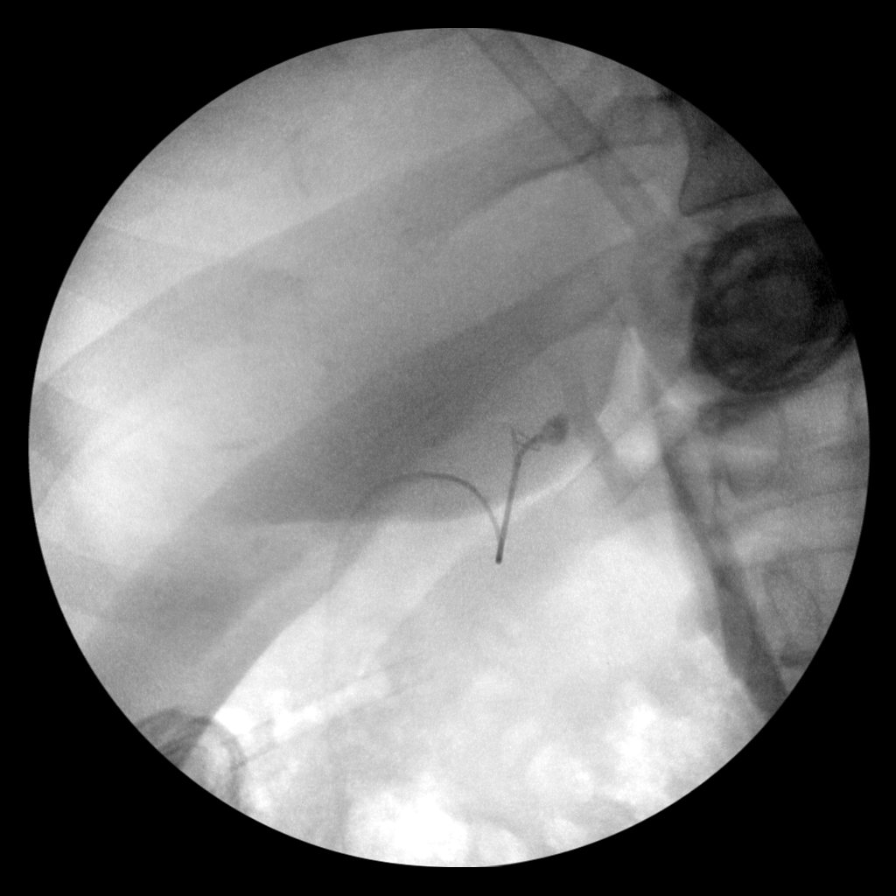
[im 1/4]
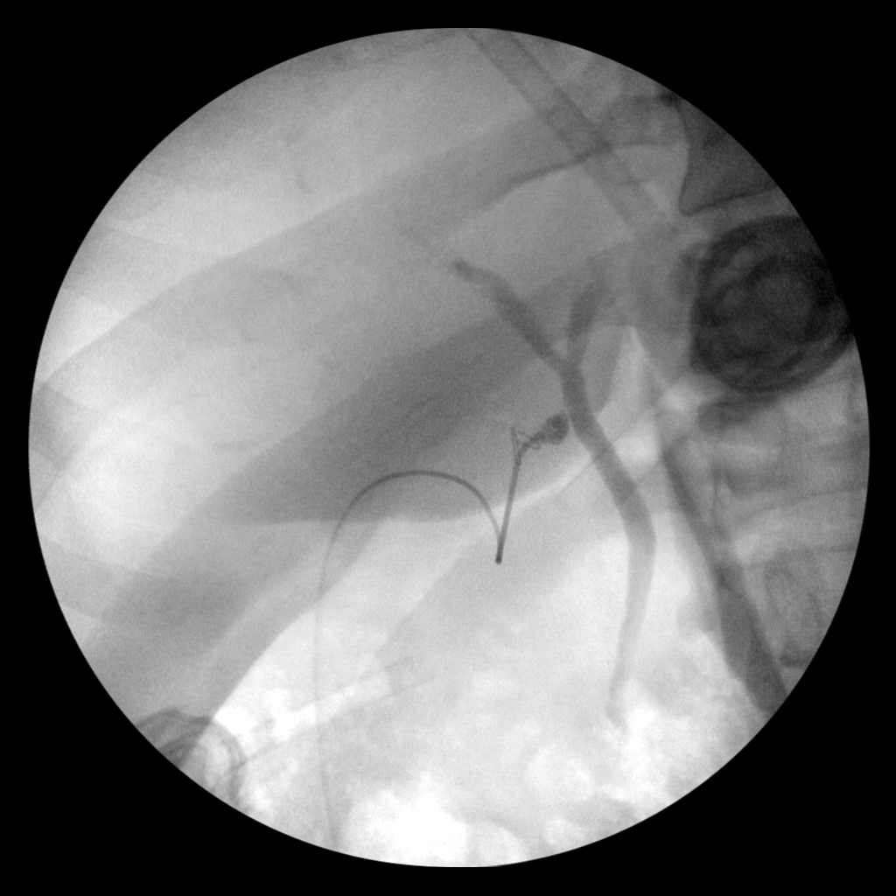
[im 1/4]
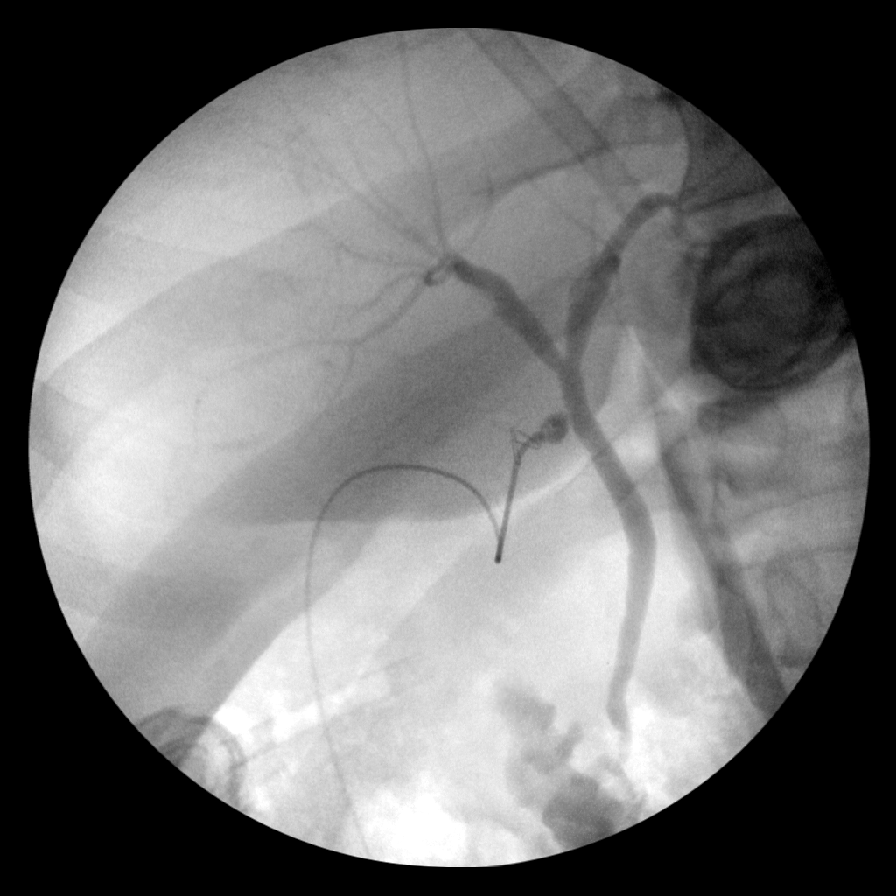
[im 1/4]
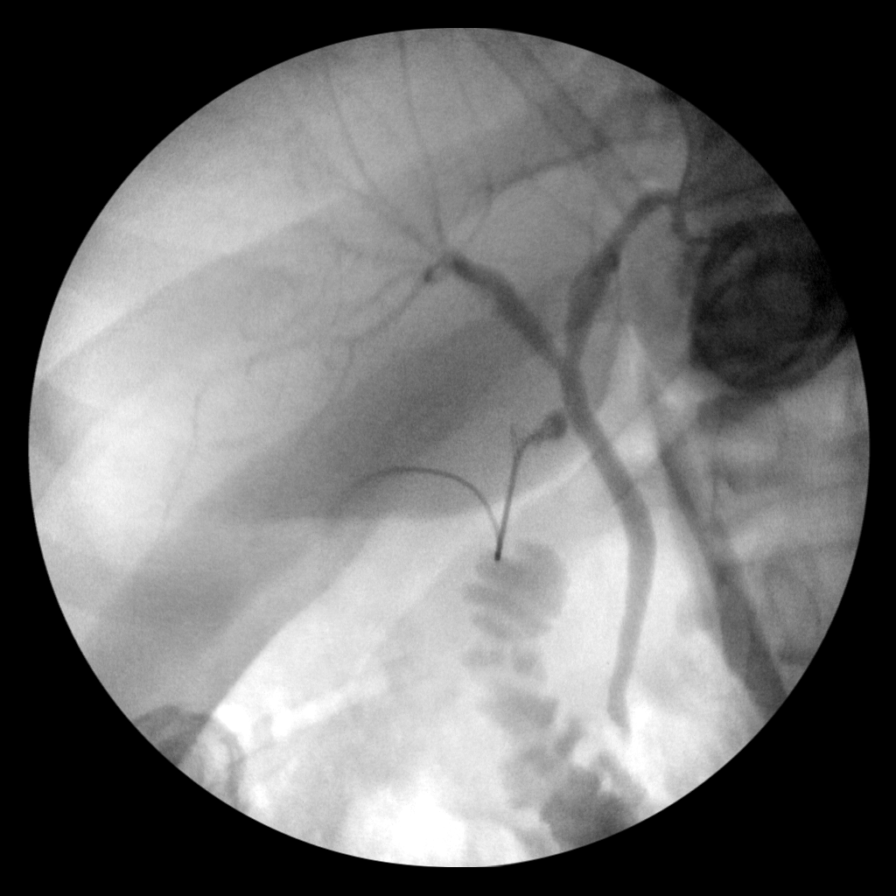
[im 2/4]
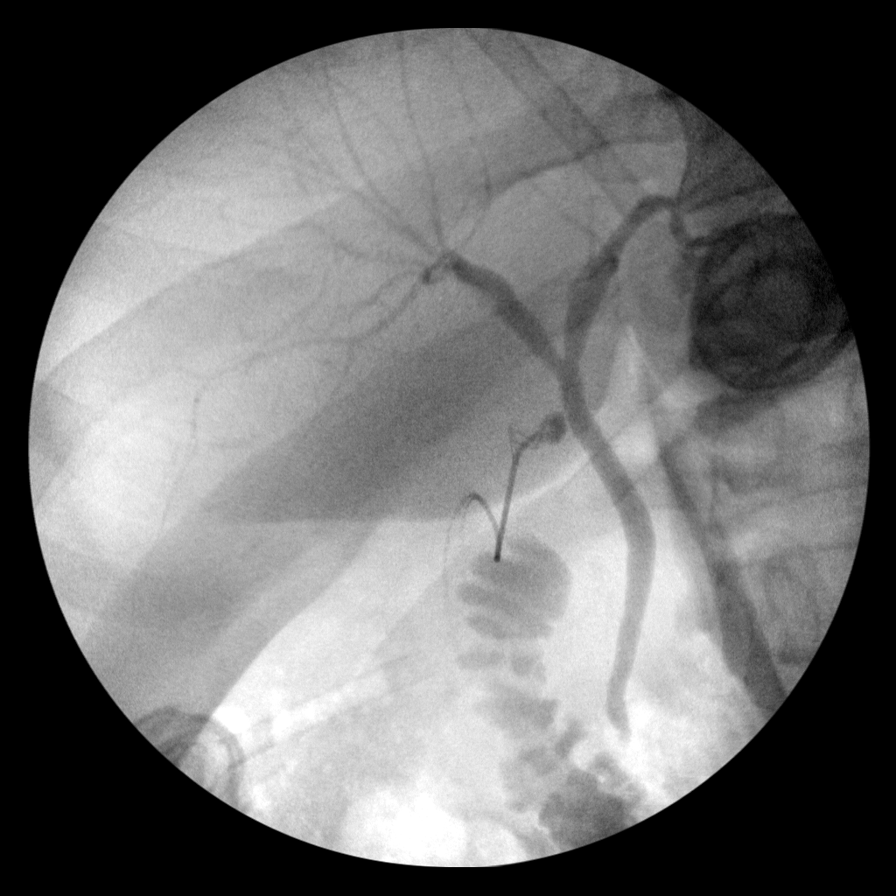
[im 3/4]
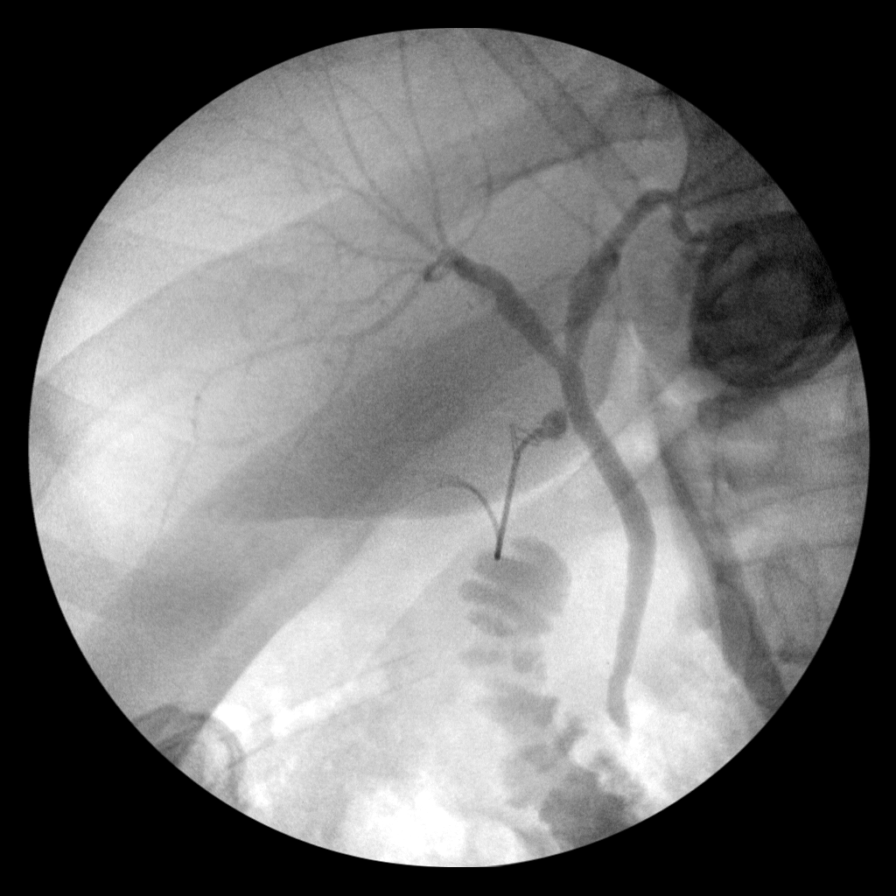
[im 4/4]
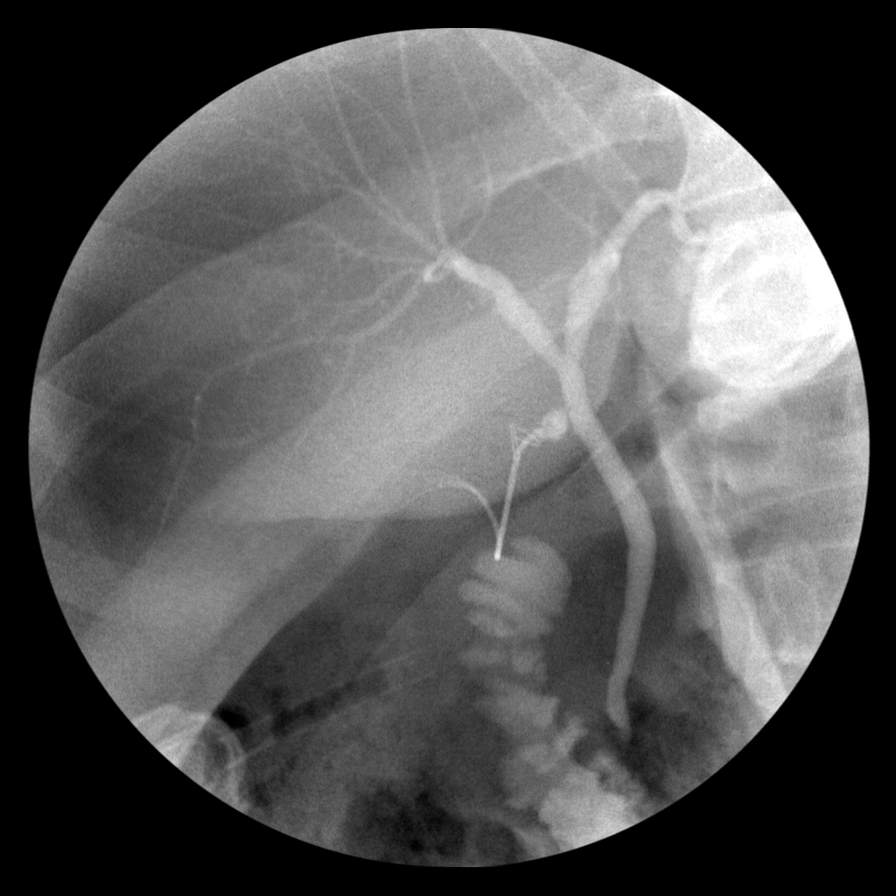

[7 of 7 positions shown; findings below may reference images not displayed]

FINDINGS: Gallbladder is been removed, and the cystic duct has been
cannulated.  The intrahepatic and extrahepatic biliary ducts appear
normal.  No mass or calculus seen.  There is apparent free flow of
contrast via the common bile duct into the duodenum.
IMPRESSION: Study within normal limits.  No obstructing lesion.

## 2015-12-05 ENCOUNTER — Other Ambulatory Visit: Payer: Self-pay | Admitting: Orthopedic Surgery

## 2015-12-05 DIAGNOSIS — M25522 Pain in left elbow: Secondary | ICD-10-CM

## 2015-12-14 ENCOUNTER — Ambulatory Visit
Admission: RE | Admit: 2015-12-14 | Discharge: 2015-12-14 | Disposition: A | Discharge status: Home / Self Care | Payer: 59 | Source: Ambulatory Visit | Attending: Orthopedic Surgery | Admitting: Orthopedic Surgery

## 2015-12-14 DIAGNOSIS — M25522 Pain in left elbow: Secondary | ICD-10-CM

## 2016-02-11 ENCOUNTER — Other Ambulatory Visit: Payer: Self-pay | Admitting: Orthopedic Surgery

## 2016-02-12 ENCOUNTER — Encounter (HOSPITAL_BASED_OUTPATIENT_CLINIC_OR_DEPARTMENT_OTHER): Payer: Self-pay | Admitting: *Deleted

## 2016-02-13 ENCOUNTER — Other Ambulatory Visit: Payer: Self-pay

## 2016-02-13 ENCOUNTER — Encounter (HOSPITAL_BASED_OUTPATIENT_CLINIC_OR_DEPARTMENT_OTHER)
Admission: RE | Admit: 2016-02-13 | Discharge: 2016-02-13 | Disposition: A | Discharge status: Home / Self Care | Payer: BLUE CROSS/BLUE SHIELD | Source: Ambulatory Visit | Attending: Orthopedic Surgery | Admitting: Orthopedic Surgery

## 2016-02-13 DIAGNOSIS — I2510 Atherosclerotic heart disease of native coronary artery without angina pectoris: Secondary | ICD-10-CM | POA: Diagnosis not present

## 2016-02-13 DIAGNOSIS — Z01812 Encounter for preprocedural laboratory examination: Secondary | ICD-10-CM | POA: Diagnosis present

## 2016-02-13 DIAGNOSIS — Z01810 Encounter for preprocedural cardiovascular examination: Secondary | ICD-10-CM | POA: Insufficient documentation

## 2016-02-13 DIAGNOSIS — E119 Type 2 diabetes mellitus without complications: Secondary | ICD-10-CM | POA: Diagnosis not present

## 2016-02-13 DIAGNOSIS — Z955 Presence of coronary angioplasty implant and graft: Secondary | ICD-10-CM | POA: Insufficient documentation

## 2016-02-13 DIAGNOSIS — I10 Essential (primary) hypertension: Secondary | ICD-10-CM | POA: Diagnosis not present

## 2016-02-13 LAB — BASIC METABOLIC PANEL
ANION GAP: 14 (ref 5–15)
BUN: 17 mg/dL (ref 6–20)
CALCIUM: 9.2 mg/dL (ref 8.9–10.3)
CHLORIDE: 101 mmol/L (ref 101–111)
CO2: 23 mmol/L (ref 22–32)
Creatinine, Ser: 1.52 mg/dL — ABNORMAL HIGH (ref 0.61–1.24)
GFR calc non Af Amer: 52 mL/min — ABNORMAL LOW (ref >60)
GLUCOSE: 430 mg/dL — AB (ref 65–99)
POTASSIUM: 4 mmol/L (ref 3.5–5.1)
Sodium: 138 mmol/L (ref 135–145)

## 2016-02-13 NOTE — ED Notes (Signed)
High glucose result 430 given to Dr Ivin Booty.  Dr Ivin Booty wants Dr Luiz Blare be contacted with results. Also wants pt to be seen by private MD to get control of diabetes before pt can have surgery.  Bring patient in on the 7th for bmet to check glucose level. Spoke with Joni Reining at Dr Luiz Blare office, info given to her.  She spoke with Rosanne Ashing PA for Dr Luiz Blare. Will call pt , have pt  see his doctor for diabetes control and will have him come here morning of the 7th for recheck Bmet and med clearance.

## 2016-02-18 ENCOUNTER — Encounter (HOSPITAL_BASED_OUTPATIENT_CLINIC_OR_DEPARTMENT_OTHER)
Admission: RE | Admit: 2016-02-18 | Discharge: 2016-02-18 | Disposition: A | Discharge status: Home / Self Care | Payer: BLUE CROSS/BLUE SHIELD | Source: Ambulatory Visit | Attending: Orthopedic Surgery | Admitting: Orthopedic Surgery

## 2016-02-18 ENCOUNTER — Other Ambulatory Visit: Payer: Self-pay | Admitting: Orthopedic Surgery

## 2016-02-18 DIAGNOSIS — M7522 Bicipital tendinitis, left shoulder: Secondary | ICD-10-CM | POA: Diagnosis present

## 2016-02-18 DIAGNOSIS — I2510 Atherosclerotic heart disease of native coronary artery without angina pectoris: Secondary | ICD-10-CM | POA: Diagnosis not present

## 2016-02-18 DIAGNOSIS — I10 Essential (primary) hypertension: Secondary | ICD-10-CM | POA: Diagnosis not present

## 2016-02-18 DIAGNOSIS — Z794 Long term (current) use of insulin: Secondary | ICD-10-CM | POA: Diagnosis not present

## 2016-02-18 DIAGNOSIS — X58XXXA Exposure to other specified factors, initial encounter: Secondary | ICD-10-CM | POA: Insufficient documentation

## 2016-02-18 DIAGNOSIS — Z01812 Encounter for preprocedural laboratory examination: Secondary | ICD-10-CM | POA: Diagnosis present

## 2016-02-18 DIAGNOSIS — E119 Type 2 diabetes mellitus without complications: Secondary | ICD-10-CM | POA: Diagnosis not present

## 2016-02-18 DIAGNOSIS — Z79899 Other long term (current) drug therapy: Secondary | ICD-10-CM | POA: Diagnosis not present

## 2016-02-18 DIAGNOSIS — Z87891 Personal history of nicotine dependence: Secondary | ICD-10-CM | POA: Diagnosis not present

## 2016-02-18 DIAGNOSIS — S46212A Strain of muscle, fascia and tendon of other parts of biceps, left arm, initial encounter: Secondary | ICD-10-CM | POA: Diagnosis not present

## 2016-02-18 DIAGNOSIS — Z955 Presence of coronary angioplasty implant and graft: Secondary | ICD-10-CM | POA: Diagnosis not present

## 2016-02-18 DIAGNOSIS — E785 Hyperlipidemia, unspecified: Secondary | ICD-10-CM | POA: Diagnosis not present

## 2016-02-18 DIAGNOSIS — I252 Old myocardial infarction: Secondary | ICD-10-CM | POA: Diagnosis not present

## 2016-02-18 DIAGNOSIS — Z6836 Body mass index (BMI) 36.0-36.9, adult: Secondary | ICD-10-CM | POA: Diagnosis not present

## 2016-02-18 DIAGNOSIS — Z7982 Long term (current) use of aspirin: Secondary | ICD-10-CM | POA: Diagnosis not present

## 2016-02-18 DIAGNOSIS — Z86718 Personal history of other venous thrombosis and embolism: Secondary | ICD-10-CM | POA: Diagnosis not present

## 2016-02-18 LAB — BASIC METABOLIC PANEL
ANION GAP: 12 (ref 5–15)
BUN: 26 mg/dL — AB (ref 6–20)
CHLORIDE: 104 mmol/L (ref 101–111)
CO2: 23 mmol/L (ref 22–32)
Calcium: 9.6 mg/dL (ref 8.9–10.3)
Creatinine, Ser: 1.73 mg/dL — ABNORMAL HIGH (ref 0.61–1.24)
GFR calc Af Amer: 51 mL/min — ABNORMAL LOW (ref >60)
GFR calc non Af Amer: 44 mL/min — ABNORMAL LOW (ref >60)
Glucose, Bld: 132 mg/dL — ABNORMAL HIGH (ref 65–99)
POTASSIUM: 4.4 mmol/L (ref 3.5–5.1)
SODIUM: 139 mmol/L (ref 135–145)

## 2016-02-18 NOTE — Progress Notes (Signed)
Order for surgical consent doesn't indicate which side on "procedure" (although left is indicated under "reason"). Pt is a first case tomorrow morning. [facility]Gra[facility] and requested new order be placed in order to keep case from being delayed in the morning.

## 2016-02-18 NOTE — H&P (Addendum)
PREOPERATIVE H&P  Chief Complaint: Painful left elbow with prolonged treatment for bicipital tendinitis  HPI: Elijah Watkins is a 50 y.o. male who presents for evaluation of Painful left elbow with prolonged treatment for bicipital tendinitis. It has been present for Greater than 3 months and has been worsening. He has failed conservative measuresIncluding activity modification, injection therapy, and splinting. Pain is rated as moderate.  Past Medical History  Diagnosis Date  . Hyperlipidemia   . CAD (coronary artery disease)   . Overweight(278.02)   . Pilonidal cyst 05/2012    area is open, not draining  . Anxiety   . S/P coronary artery stent placement 2001, 05/2011  . Hypertension     under control, has been on med. > 7 yrs.  . Seasonal allergies   . Dental crowns present   . Nephrolithiasis     states has 3 stones in kidney, but no current problems  . Anger     anger issues  . History of concussion   . History of peptic ulcer age 50  . History of DVT (deep vein thrombosis) 03/2005  . PONV (postoperative nausea and vomiting)   . Pneumonia     hx  . Diabetes mellitus     IDDM - fasting 70-120s  . GERD (gastroesophageal reflux disease)   . Arthritis   . Myocardial infarction [facility])     stents   Past Surgical History  Procedure Laterality Date  . Kidney stone surgery  1988 - 1998    x 3  . Knee cartilage surgery  10/2003, 11/2003    Also had bursa removed  . Wrist surgery  02/1997    right  . Coronary angioplasty with stent placement      x 2; last time 05/2011  . Cardiac catheterization  10/06/2010  . Chondroplasty  08/04/2004    right knee  . Prepatellar bursa excision  12/05/2004    right  . Pilonidal cyst drainage  05/19/2012    Procedure: IRRIGATION AND DEBRIDEMENT PILONIDAL CYST;  Surgeon: Robyne AskewPaul S Toth III, MD;  Location: [facility];  Service: General;  Laterality: N/A;  I & D Pilonidal abscess, placement of acell  . Knee arthroscopic      left  knee  . Cholecystectomy N/A 08/18/2013    Procedure: LAPAROSCOPIC CHOLECYSTECTOMY WITH INTRAOPERATIVE CHOLANGIOGRAM;  Surgeon: Robyne AskewPaul S Toth III, MD;  Location: [facility];  Service: General;  Laterality: N/A;  . Carpal tunnel release Right    Social History   Social History  . Marital Status: Married    Spouse Name: N/A  . Number of Children: N/A  . Years of Education: N/A   Social History Main Topics  . Smoking status: Former Smoker    Quit date: 02/24/1984  . Smokeless tobacco: Never Used  . Alcohol Use: Yes     Comment: rare  . Drug Use: No  . Sexual Activity: Not Asked   Other Topics Concern  . None   Social History Narrative   Family History  Problem Relation Age of Onset  . Hypertension Father   . Alcohol abuse Father [id number]  . Diabetes Mother [id number]   Allergies  Allergen Reactions  . Latex Hives, Swelling and Other (See Comments)    Dry cough   Prior to Admission medications   Medication Sig Start Date End Date Taking? Authorizing Provider  ALPRAZolam Prudy Feeler(XANAX) 1 MG tablet Take 1-2 mg by mouth 3 (three) times daily as needed for anxiety.  Yes Historical Provider, MD  aspirin 81 MG tablet Take 81 mg by mouth daily.   Yes Historical Provider, MD  gabapentin (NEURONTIN) 600 MG tablet Take 600 mg by mouth 2 (two) times daily.    Yes Historical Provider, MD  gemfibrozil (LOPID) 600 MG tablet Take 600 mg by mouth 2 (two) times daily before a meal.   Yes Historical Provider, MD  insulin NPH (HUMULIN N,NOVOLIN N) 100 UNIT/ML injection Inject into the skin 2 (two) times daily. Takes 50u am and 50u pm   Yes Historical Provider, MD  lisinopril (PRINIVIL,ZESTRIL) 5 MG tablet Take 5 mg by mouth daily.   Yes Historical Provider, MD  metFORMIN (GLUCOPHAGE) 1000 MG tablet Take 1,000 mg by mouth 2 (two) times daily with a meal.   Yes Historical Provider, MD  metoprolol (LOPRESSOR) 50 MG tablet Take 50 mg by mouth 2 (two) times daily.   Yes Historical Provider, MD  omeprazole (PRILOSEC) 20 MG  capsule Take 20 mg by mouth 2 (two) times daily before a meal.   Yes Historical Provider, MD  pravastatin (PRAVACHOL) 80 MG tablet Take 80 mg by mouth daily.   Yes Historical Provider, MD  nitroGLYCERIN (NITROSTAT) 0.4 MG SL tablet Place 0.4 mg under the tongue every 5 (five) minutes as needed. x3 doses as needed for chest pain    Historical Provider, MD  zolpidem (AMBIEN) 10 MG tablet Take 10 mg by mouth at bedtime.     Historical Provider, MD     Positive ROS: None  All other systems have been reviewed and were otherwise negative with the exception of those mentioned in the HPI and as above.  Physical Exam: Filed Vitals:   02/19/16 0733  BP: 128/82  Pulse: 67  Temp: 98.1 F (36.7 C)  Resp: 18    General: Alert, no acute distress Cardiovascular: No pedal edema Respiratory: No cyanosis, no use of accessory musculature GI: No organomegaly, abdomen is soft and non-tender Skin: No lesions in the area of chief complaint Neurologic: Sensation intact distally Psychiatric: Patient is competent for consent with normal mood and affect Lymphatic: No axillary or cervical lymphadenopathy  MUSCULOSKELETAL: Left elbow: Painful range of motion.Tenderness palpation over the biceps tendon insertion neurovascular intact distally.  Assessment/Plan: Left distal biceps partial tear  Plan for Procedure(s): DISTAL BICEPS TENDON Takedown and REPAIR  The risks benefits and alternatives were discussed with the patient including but not limited to the risks of nonoperative treatment, versus surgical intervention including infection, bleeding, nerve injury, malunion, nonunion, hardware prominence, hardware failure, need for hardware removal, blood clots, cardiopulmonary complications, morbidity, mortality, among others, and they were willing to proceed.  Predicted outcome is good, although there will be at least a six to nine month expected recovery.  [name] L, MD 02/19/2016 8:29 AM

## 2016-02-19 ENCOUNTER — Ambulatory Visit (HOSPITAL_BASED_OUTPATIENT_CLINIC_OR_DEPARTMENT_OTHER): Payer: BLUE CROSS/BLUE SHIELD | Admitting: Anesthesiology

## 2016-02-19 ENCOUNTER — Encounter (HOSPITAL_BASED_OUTPATIENT_CLINIC_OR_DEPARTMENT_OTHER): Payer: Self-pay | Admitting: *Deleted

## 2016-02-19 ENCOUNTER — Encounter (HOSPITAL_BASED_OUTPATIENT_CLINIC_OR_DEPARTMENT_OTHER)
Admission: RE | Disposition: A | Discharge status: Home / Self Care | Payer: Self-pay | Source: Ambulatory Visit | Attending: Orthopedic Surgery

## 2016-02-19 ENCOUNTER — Ambulatory Visit (HOSPITAL_BASED_OUTPATIENT_CLINIC_OR_DEPARTMENT_OTHER)
Admission: RE | Admit: 2016-02-19 | Discharge: 2016-02-19 | Disposition: A | Discharge status: Home / Self Care | Payer: BLUE CROSS/BLUE SHIELD | Source: Ambulatory Visit | Attending: Orthopedic Surgery | Admitting: Orthopedic Surgery

## 2016-02-19 DIAGNOSIS — E119 Type 2 diabetes mellitus without complications: Secondary | ICD-10-CM | POA: Insufficient documentation

## 2016-02-19 DIAGNOSIS — Z6836 Body mass index (BMI) 36.0-36.9, adult: Secondary | ICD-10-CM | POA: Insufficient documentation

## 2016-02-19 DIAGNOSIS — I10 Essential (primary) hypertension: Secondary | ICD-10-CM | POA: Insufficient documentation

## 2016-02-19 DIAGNOSIS — E785 Hyperlipidemia, unspecified: Secondary | ICD-10-CM | POA: Insufficient documentation

## 2016-02-19 DIAGNOSIS — M7522 Bicipital tendinitis, left shoulder: Secondary | ICD-10-CM | POA: Insufficient documentation

## 2016-02-19 DIAGNOSIS — I252 Old myocardial infarction: Secondary | ICD-10-CM | POA: Insufficient documentation

## 2016-02-19 DIAGNOSIS — Z86718 Personal history of other venous thrombosis and embolism: Secondary | ICD-10-CM | POA: Insufficient documentation

## 2016-02-19 DIAGNOSIS — Z955 Presence of coronary angioplasty implant and graft: Secondary | ICD-10-CM | POA: Insufficient documentation

## 2016-02-19 DIAGNOSIS — Z79899 Other long term (current) drug therapy: Secondary | ICD-10-CM | POA: Insufficient documentation

## 2016-02-19 DIAGNOSIS — Z7982 Long term (current) use of aspirin: Secondary | ICD-10-CM | POA: Insufficient documentation

## 2016-02-19 DIAGNOSIS — I2510 Atherosclerotic heart disease of native coronary artery without angina pectoris: Secondary | ICD-10-CM | POA: Insufficient documentation

## 2016-02-19 DIAGNOSIS — Z794 Long term (current) use of insulin: Secondary | ICD-10-CM | POA: Insufficient documentation

## 2016-02-19 DIAGNOSIS — Z87891 Personal history of nicotine dependence: Secondary | ICD-10-CM | POA: Insufficient documentation

## 2016-02-19 HISTORY — PX: DISTAL BICEPS TENDON REPAIR: SHX1461

## 2016-02-19 LAB — GLUCOSE, CAPILLARY
GLUCOSE-CAPILLARY: 155 mg/dL — AB (ref 65–99)
GLUCOSE-CAPILLARY: 144 mg/dL — AB (ref 65–99)

## 2016-02-19 SURGERY — REPAIR, TENDON, BICEPS, DISTAL
Anesthesia: General | Site: Elbow | Laterality: Left

## 2016-02-19 MED ORDER — HYDROMORPHONE HCL 1 MG/ML IJ SOLN
0.5000 mg | INTRAMUSCULAR | Status: DC | PRN
  Administered 2016-02-19: 0.5 mg via INTRAVENOUS

## 2016-02-19 MED ORDER — SCOPOLAMINE 1 MG/3DAYS TD PT72
MEDICATED_PATCH | TRANSDERMAL | Status: AC
  Filled 2016-02-19: qty 1

## 2016-02-19 MED ORDER — SCOPOLAMINE 1 MG/3DAYS TD PT72
1.0000 | MEDICATED_PATCH | Freq: Once | TRANSDERMAL | Status: DC | PRN
  Administered 2016-02-19: 1.5 mg via TRANSDERMAL

## 2016-02-19 MED ORDER — CEPHALEXIN 500 MG PO CAPS: 500.0000 mg | ORAL_CAPSULE | Freq: Three times a day (TID) | ORAL | Status: DC

## 2016-02-19 MED ORDER — LIDOCAINE HCL (CARDIAC) 20 MG/ML IV SOLN
INTRAVENOUS | Status: AC
  Filled 2016-02-19: qty 5

## 2016-02-19 MED ORDER — HYDROMORPHONE HCL 1 MG/ML IJ SOLN
INTRAMUSCULAR | Status: AC
  Filled 2016-02-19: qty 1

## 2016-02-19 MED ORDER — CHLORHEXIDINE GLUCONATE 4 % EX LIQD: 60.0000 mL | Freq: Once | CUTANEOUS | Status: DC

## 2016-02-19 MED ORDER — CEFAZOLIN SODIUM-DEXTROSE 2-3 GM-% IV SOLR
INTRAVENOUS | Status: AC
  Filled 2016-02-19: qty 50

## 2016-02-19 MED ORDER — GLYCOPYRROLATE 0.2 MG/ML IJ SOLN: 0.2000 mg | Freq: Once | INTRAMUSCULAR | Status: DC | PRN

## 2016-02-19 MED ORDER — BUPIVACAINE-EPINEPHRINE (PF) 0.5% -1:200000 IJ SOLN
INTRAMUSCULAR | Status: AC
  Filled 2016-02-19: qty 30

## 2016-02-19 MED ORDER — CEFAZOLIN SODIUM-DEXTROSE 2-3 GM-% IV SOLR
2.0000 g | INTRAVENOUS | Status: AC
  Administered 2016-02-19: 2 g via INTRAVENOUS

## 2016-02-19 MED ORDER — OXYCODONE-ACETAMINOPHEN 5-325 MG PO TABS: 1.0000 | ORAL_TABLET | ORAL | Status: DC | PRN

## 2016-02-19 MED ORDER — FENTANYL CITRATE (PF) 100 MCG/2ML IJ SOLN
INTRAMUSCULAR | Status: AC
  Filled 2016-02-19: qty 2

## 2016-02-19 MED ORDER — LACTATED RINGERS IV SOLN
INTRAVENOUS | Status: DC
  Administered 2016-02-19 (×2): via INTRAVENOUS

## 2016-02-19 MED ORDER — HYDROMORPHONE HCL 1 MG/ML IJ SOLN
0.2500 mg | INTRAMUSCULAR | Status: DC | PRN
  Administered 2016-02-19 (×4): 0.5 mg via INTRAVENOUS

## 2016-02-19 MED ORDER — OXYCODONE HCL 5 MG/5ML PO SOLN: 5.0000 mg | Freq: Once | ORAL | Status: AC | PRN

## 2016-02-19 MED ORDER — OXYCODONE HCL 5 MG PO TABS
5.0000 mg | ORAL_TABLET | Freq: Once | ORAL | Status: AC | PRN
  Administered 2016-02-19: 5 mg via ORAL

## 2016-02-19 MED ORDER — SUCCINYLCHOLINE CHLORIDE 20 MG/ML IJ SOLN
INTRAMUSCULAR | Status: AC
  Filled 2016-02-19: qty 1

## 2016-02-19 MED ORDER — ONDANSETRON HCL 4 MG/2ML IJ SOLN: INTRAMUSCULAR | Status: DC | PRN

## 2016-02-19 MED ORDER — PROPOFOL 10 MG/ML IV BOLUS
INTRAVENOUS | Status: DC | PRN
  Administered 2016-02-19: 300 mg via INTRAVENOUS

## 2016-02-19 MED ORDER — LIDOCAINE HCL (CARDIAC) 20 MG/ML IV SOLN
INTRAVENOUS | Status: DC | PRN
  Administered 2016-02-19: 100 mg via INTRAVENOUS

## 2016-02-19 MED ORDER — MIDAZOLAM HCL 2 MG/2ML IJ SOLN
INTRAMUSCULAR | Status: AC
  Filled 2016-02-19: qty 2

## 2016-02-19 MED ORDER — DEXAMETHASONE SODIUM PHOSPHATE 10 MG/ML IJ SOLN
INTRAMUSCULAR | Status: AC
  Filled 2016-02-19: qty 1

## 2016-02-19 MED ORDER — 0.9 % SODIUM CHLORIDE (POUR BTL) OPTIME
TOPICAL | Status: DC | PRN
  Administered 2016-02-19: 250 mL

## 2016-02-19 MED ORDER — ONDANSETRON HCL 4 MG/2ML IJ SOLN
INTRAMUSCULAR | Status: DC | PRN
  Administered 2016-02-19: 4 mg via INTRAVENOUS

## 2016-02-19 MED ORDER — ONDANSETRON HCL 4 MG/2ML IJ SOLN
INTRAMUSCULAR | Status: AC
  Filled 2016-02-19: qty 2

## 2016-02-19 MED ORDER — BUPIVACAINE HCL (PF) 0.5 % IJ SOLN
INTRAMUSCULAR | Status: DC | PRN
  Administered 2016-02-19: 10 mL

## 2016-02-19 MED ORDER — PROPOFOL 500 MG/50ML IV EMUL
INTRAVENOUS | Status: AC
  Filled 2016-02-19: qty 50

## 2016-02-19 MED ORDER — DEXAMETHASONE SODIUM PHOSPHATE 4 MG/ML IJ SOLN
INTRAMUSCULAR | Status: DC | PRN
  Administered 2016-02-19: 5 mg via INTRAVENOUS

## 2016-02-19 MED ORDER — FENTANYL CITRATE (PF) 100 MCG/2ML IJ SOLN
50.0000 ug | INTRAMUSCULAR | Status: DC | PRN
  Administered 2016-02-19: 100 ug via INTRAVENOUS

## 2016-02-19 MED ORDER — OXYCODONE HCL 5 MG PO TABS
ORAL_TABLET | ORAL | Status: AC
  Filled 2016-02-19: qty 1

## 2016-02-19 MED ORDER — MIDAZOLAM HCL 2 MG/2ML IJ SOLN
1.0000 mg | INTRAMUSCULAR | Status: DC | PRN
  Administered 2016-02-19: 2 mg via INTRAVENOUS

## 2016-02-19 MED ORDER — MEPERIDINE HCL 25 MG/ML IJ SOLN: 6.2500 mg | INTRAMUSCULAR | Status: DC | PRN

## 2016-02-19 MED ORDER — BUPIVACAINE HCL (PF) 0.5 % IJ SOLN
INTRAMUSCULAR | Status: AC
  Filled 2016-02-19: qty 30

## 2016-02-19 SURGICAL SUPPLY — 78 items
ANCH SUT 12 BICEPS BTN STRL (Orthopedic Implant) ×1 IMPLANT
APL SKNCLS STERI-STRIP NONHPOA (GAUZE/BANDAGES/DRESSINGS) ×1
BANDAGE ACE 3X5.8 VEL STRL LF (GAUZE/BANDAGES/DRESSINGS) IMPLANT
BANDAGE ACE 4X5 VEL STRL LF (GAUZE/BANDAGES/DRESSINGS) ×2 IMPLANT
BENZOIN TINCTURE PRP APPL 2/3 (GAUZE/BANDAGES/DRESSINGS) ×2 IMPLANT
BLADE SURG 15 STRL LF DISP TIS (BLADE) ×1 IMPLANT
BLADE SURG 15 STRL SS (BLADE) ×4
BNDG CMPR 9X4 STRL LF SNTH (GAUZE/BANDAGES/DRESSINGS) ×1
BNDG ESMARK 4X9 LF (GAUZE/BANDAGES/DRESSINGS) ×1 IMPLANT
BUTTON DISTAL BICEPS (Orthopedic Implant) ×1 IMPLANT
CANISTER SUCT 1200ML W/VALVE (MISCELLANEOUS) ×1 IMPLANT
CLIP TI MEDIUM 6 (CLIP) ×1 IMPLANT
COVER BACK TABLE 60X90IN (DRAPES) ×2 IMPLANT
COVER SURGICAL LIGHT HANDLE (MISCELLANEOUS) ×1 IMPLANT
CUFF TOURN SGL LL 18 NRW (TOURNIQUET CUFF) ×3 IMPLANT
CUFF TOURNIQUET SINGLE 18IN (TOURNIQUET CUFF) IMPLANT
DECANTER SPIKE VIAL GLASS SM (MISCELLANEOUS) ×2 IMPLANT
DRAPE EXTREMITY T 121X128X90 (DRAPE) ×2 IMPLANT
DRAPE OEC MINIVIEW 54X84 (DRAPES) ×2 IMPLANT
DRAPE U 20/CS (DRAPES) ×2 IMPLANT
DRAPE U-SHAPE 47X51 STRL (DRAPES) ×2 IMPLANT
DRSG EMULSION OIL 3X3 NADH (GAUZE/BANDAGES/DRESSINGS) ×1 IMPLANT
DURAPREP 26ML APPLICATOR (WOUND CARE) ×2 IMPLANT
ELECT REM PT RETURN 9FT ADLT (ELECTROSURGICAL) ×2
ELECTRODE REM PT RTRN 9FT ADLT (ELECTROSURGICAL) ×1 IMPLANT
GAUZE SPONGE 4X4 12PLY STRL (GAUZE/BANDAGES/DRESSINGS) ×2 IMPLANT
GLOVE BIOGEL PI IND STRL 7.0 (GLOVE) IMPLANT
GLOVE BIOGEL PI IND STRL 8 (GLOVE) ×2 IMPLANT
GLOVE BIOGEL PI INDICATOR 7.0 (GLOVE) ×1
GLOVE BIOGEL PI INDICATOR 8 (GLOVE) ×2
GLOVE ECLIPSE 7.5 STRL STRAW (GLOVE) ×2 IMPLANT
GLOVE EXAM NITRILE MD LF STRL (GLOVE) ×1 IMPLANT
GLOVE SURG SS PI 7.0 STRL IVOR (GLOVE) ×1 IMPLANT
GLOVE SURG SS PI 7.5 STRL IVOR (GLOVE) ×2 IMPLANT
GLOVE SURG SS PI 8.0 STRL IVOR (GLOVE) ×1 IMPLANT
GOWN STRL REUS W/ TWL LRG LVL3 (GOWN DISPOSABLE) ×1 IMPLANT
GOWN STRL REUS W/ TWL XL LVL3 (GOWN DISPOSABLE) ×1 IMPLANT
GOWN STRL REUS W/TWL LRG LVL3 (GOWN DISPOSABLE) ×2
GOWN STRL REUS W/TWL XL LVL3 (GOWN DISPOSABLE) ×4 IMPLANT
INSERTER BUTTON (SYSTAGENIX WOUND MANAGEMENT) ×1 IMPLANT
K-WIRE .045X4 (WIRE) IMPLANT
LOOP VESSEL MAXI BLUE (MISCELLANEOUS) IMPLANT
NEEDLE HYPO 22GX1.5 SAFETY (NEEDLE) ×2 IMPLANT
NS IRRIG 1000ML POUR BTL (IV SOLUTION) ×2 IMPLANT
PACK BASIN DAY SURGERY FS (CUSTOM PROCEDURE TRAY) ×2 IMPLANT
PAD CAST 4YDX4 CTTN HI CHSV (CAST SUPPLIES) ×2 IMPLANT
PADDING CAST ABS 4INX4YD NS (CAST SUPPLIES) ×1
PADDING CAST ABS COTTON 4X4 ST (CAST SUPPLIES) ×1 IMPLANT
PADDING CAST COTTON 4X4 STRL (CAST SUPPLIES) ×4
PASSER SUT SWANSON 36MM LOOP (INSTRUMENTS) ×1 IMPLANT
PENCIL BUTTON HOLSTER BLD 10FT (ELECTRODE) ×2 IMPLANT
PIN DRILL ACL TIGHTROPE 4MM (PIN) ×1 IMPLANT
SLEEVE SCD COMPRESS KNEE MED (MISCELLANEOUS) ×2 IMPLANT
SLING ARM FOAM STRAP LRG (SOFTGOODS) IMPLANT
SLING ARM FOAM STRAP XLG (SOFTGOODS) ×1 IMPLANT
SLING ARM MED ADULT FOAM STRAP (SOFTGOODS) IMPLANT
SPLINT FAST PLASTER 5X30 (CAST SUPPLIES)
SPLINT FIBERGLASS 3X35 (CAST SUPPLIES) IMPLANT
SPLINT FIBERGLASS 4X30 (CAST SUPPLIES) ×2 IMPLANT
SPLINT PLASTER CAST FAST 5X30 (CAST SUPPLIES) IMPLANT
STOCKINETTE 4X48 STRL (DRAPES) ×2 IMPLANT
STRIP CLOSURE SKIN 1/2X4 (GAUZE/BANDAGES/DRESSINGS) ×2 IMPLANT
SUCTION FRAZIER HANDLE 10FR (MISCELLANEOUS) ×1
SUCTION TUBE FRAZIER 10FR DISP (MISCELLANEOUS) IMPLANT
SUT 2 FIBERLOOP 20 STRT BLUE (SUTURE) ×2
SUT MNCRL AB 3-0 PS2 18 (SUTURE) IMPLANT
SUT VIC AB 0 CT1 27 (SUTURE) ×2
SUT VIC AB 0 CT1 27XBRD ANBCTR (SUTURE) IMPLANT
SUT VIC AB 3-0 SH 27 (SUTURE) ×2
SUT VIC AB 3-0 SH 27X BRD (SUTURE) IMPLANT
SUTURE 2 FIBERLOOP 20 STRT BLU (SUTURE) IMPLANT
SYR BULB 3OZ (MISCELLANEOUS) ×1 IMPLANT
SYR CONTROL 10ML LL (SYRINGE) ×2 IMPLANT
TOWEL OR 17X24 6PK STRL BLUE (TOWEL DISPOSABLE) ×3 IMPLANT
TOWEL OR NON WOVEN STRL DISP B (DISPOSABLE) ×2 IMPLANT
TUBE CONNECTING 20X1/4 (TUBING) ×1 IMPLANT
UNDERPAD 30X30 (UNDERPADS AND DIAPERS) ×2 IMPLANT
YANKAUER SUCT BULB TIP NO VENT (SUCTIONS) ×1 IMPLANT

## 2016-02-19 NOTE — Anesthesia Preprocedure Evaluation (Signed)
Anesthesia Evaluation  Patient identified by MRN, date of birth, ID band Patient awake    Reviewed: Allergy & Precautions, NPO status , Patient's Chart, lab work & pertinent test results  History of Anesthesia Complications (+) PONV  Airway Mallampati: II  TM Distance: >3 FB Neck ROM: Full    Dental  (+) Teeth Intact, Dental Advisory Given   Pulmonary former smoker,    breath sounds clear to auscultation       Cardiovascular hypertension, Pt. on medications + CAD and + Past MI   Rhythm:Regular Rate:Normal     Neuro/Psych    GI/Hepatic   Endo/Other  diabetes, Well Controlled, Type 2, Insulin DependentMorbid obesity  Renal/GU      Musculoskeletal   Abdominal   Peds  Hematology   Anesthesia Other Findings   Reproductive/Obstetrics                             Anesthesia Physical Anesthesia Plan  ASA: III  Anesthesia Plan: General   Post-op Pain Management:    Induction: Intravenous  Airway Management Planned: LMA  Additional Equipment:   Intra-op Plan:   Post-operative Plan: Extubation in OR  Informed Consent: I have reviewed the patients History and Physical, chart, labs and discussed the procedure including the risks, benefits and alternatives for the proposed anesthesia with the patient or authorized representative who has indicated his/her understanding and acceptance.   Dental advisory given  Plan Discussed with: CRNA, Anesthesiologist and Surgeon  Anesthesia Plan Comments:         Anesthesia Quick Evaluation

## 2016-02-19 NOTE — Transfer of Care (Signed)
Immediate Anesthesia Transfer of Care Note  Patient: Elijah Watkins  Procedure(s) Performed: Procedure(s): DISTAL BICEPS TENDON REPAIR (Left)  Patient Location: PACU  Anesthesia Type:General  Level of Consciousness: sedated  Airway & Oxygen Therapy: Patient Spontanous Breathing and Patient connected to face mask oxygen  Post-op Assessment: Report given to RN and Post -op Vital signs reviewed and stable  Post vital signs: Reviewed and stable  Last Vitals:  Filed Vitals:   02/19/16 0733  BP: 128/82  Pulse: 67  Temp: 36.7 C  Resp: 18    Complications: No apparent anesthesia complications

## 2016-02-19 NOTE — Anesthesia Postprocedure Evaluation (Signed)
Anesthesia Post Note  Patient: Elijah Watkins  Procedure(s) Performed: Procedure(s) (LRB): DISTAL BICEPS TENDON REPAIR (Left)  Patient location during evaluation: PACU Anesthesia Type: General Level of consciousness: awake and alert Pain management: pain level controlled Vital Signs Assessment: post-procedure vital signs reviewed and stable Respiratory status: spontaneous breathing, nonlabored ventilation and respiratory function stable Cardiovascular status: blood pressure returned to baseline and stable Postop Assessment: no signs of nausea or vomiting Anesthetic complications: no    Last Vitals:  Filed Vitals:   02/19/16 1200 02/19/16 1215  BP: 129/84 140/86  Pulse: 72 80  Temp:    Resp: 9 9    Last Pain:  Filed Vitals:   02/19/16 1217  PainSc: 4                  Sigrid Schwebach A

## 2016-02-19 NOTE — Discharge Instructions (Signed)
Remain in sling at all times Ice pack to elbow area Follow up in office (10 days) for dressing change and evaluation (Call to schedule)  Call your surgeon if you experience:   1.  Fever over 101.0. 2.  Inability to urinate. 3.  Nausea and/or vomiting. 4.  Extreme swelling or bruising at the surgical site. 5.  Continued bleeding from the incision. 6.  Increased pain, redness or drainage from the incision. 7.  Problems related to your pain medication. 8. Any change in color, movement and/or sensation 9. Any problems and/or concerns   Post Anesthesia Home Care Instructions  Activity: Get plenty of rest for the remainder of the day. A responsible adult should stay with you for 24 hours following the procedure.  For the next 24 hours, DO NOT: -Drive a car -Advertising copywriterperate machinery -Drink alcoholic beverages -Take any medication unless instructed by your physician -Make any legal decisions or sign important papers.  Meals: Start with liquid foods such as gelatin or soup. Progress to regular foods as tolerated. Avoid greasy, spicy, heavy foods. If nausea and/or vomiting occur, drink only clear liquids until the nausea and/or vomiting subsides. Call your physician if vomiting continues.  Special Instructions/Symptoms: Your throat may feel dry or sore from the anesthesia or the breathing tube placed in your throat during surgery. If this causes discomfort, gargle with warm salt water. The discomfort should disappear within 24 hours.  You have a scopolamine patch placed behind your ear for the management of post- operative nausea and/or vomiting:  1. The medication in the patch is effective for 72 hours, after which it should be removed.  Wrap patch in a tissue and discard in the trash. Wash hands thoroughly with soap and water. 2. You may remove the patch earlier than 72 hours if you experience unpleasant side effects which may include dry mouth, dizziness or visual disturbances. 3. Avoid  touching the patch. Wash your hands with soap and water after contact with the patch.

## 2016-02-19 NOTE — Anesthesia Procedure Notes (Signed)
Procedure Name: LMA Insertion Date/Time: 02/19/2016 8:41 AM Performed by: Caren MacadamARTER, Ikhlas Albo W Pre-anesthesia Checklist: Patient identified, Emergency Drugs available, Suction available and Patient being monitored Patient Re-evaluated:Patient Re-evaluated prior to inductionOxygen Delivery Method: Circle System Utilized Preoxygenation: Pre-oxygenation with 100% oxygen Intubation Type: IV induction Ventilation: Mask ventilation without difficulty LMA: LMA inserted LMA Size: 5.0 Number of attempts: 1 Airway Equipment and Method: Bite block Placement Confirmation: positive ETCO2 and breath sounds checked- equal and bilateral Tube secured with: Tape Dental Injury: Teeth and Oropharynx as per pre-operative assessment

## 2016-02-20 ENCOUNTER — Encounter (HOSPITAL_BASED_OUTPATIENT_CLINIC_OR_DEPARTMENT_OTHER): Payer: Self-pay | Admitting: Orthopedic Surgery

## 2016-02-21 NOTE — Op Note (Signed)
NAME:  Elijah Watkins, Elijah Watkins NO.:  MEDICAL RECORD NO.:  [id number]  LOCATION:                                 FACILITY:  PHYSICIAN:  Harvie Junior, M.D.        DATE OF BIRTH:  DATE OF PROCEDURE:  02/21/2016 DATE OF DISCHARGE:                              OPERATIVE REPORT   PREOPERATIVE DIAGNOSIS:  Severe bicipital tendinitis, left, having failed conservative care including prolonged physical therapy, injection therapy, activity modification, and rest.  POSTOPERATIVE DIAGNOSIS:  Severe bicipital tendinitis, left, having failed conservative care including prolonged physical therapy, injection therapy, activity modification, and rest.  PROCEDURE: 1. Takedown of left biceps tendon with single incision repair with an Endobutton-style anchor. 2.interpretation of multiple intraoperative fluoroscopic images  SURGEON:  Harvie Junior, M.D.  ASSISTANT:  Elijah Watkins, P.A.  ANESTHESIA:  General.  BRIEF HISTORY:  Elijah Watkins is a 50 year old male with long history of significant complaints of bicipital tendinitis.  He had been treated in the office for 6 months with activity modification, injection therapy, use of a splint, rest, and physical therapy.  After failure of all conservative care, we had a discussion, which talked about the takedown of the bicipital tendon with three insertion and felt that this was an option, although not mandatory.  Because of his failure of conservative care, he wished to proceed with this intervention and he was brought to the operating room for this procedure.  DESCRIPTION OF PROCEDURE:  The patient was brought to the operating room.  After adequate anesthesia was obtained with general anesthetic, the patient was placed supine on the operating table.  The left arm was then prepped and draped in usual sterile fashion.  Following this, the horizontal incision was made just distal to the wrist crease, subcutaneous tissue down the  level of the bicipital tendon.  Bicipital tendon was identified and tracked down to its insertion on the radial tuberosity.  Radial tuberosity had some sort of a lack of spur on it and then feeling the bicipital tendon, it was about 75% detached from the radial tuberosity interestingly.  We tried to just pull off the rest, that really was not going to work.  So, we got a knife and released the little bit more and then it did dissect free.  At that point, we exposed the tuberosity got to the dead center and put a 4-mm pin through it, over-reamed this with a 7-mm reamer, which matched the biceps tendon, do a whipstitch through the tendon and then made a 4-mm hole distally through this same hole that we had and then put the whipstitch through an Endobutton and then passed the Endobutton through the backside of the whole, flipped it and then brought the two stitches down, tightened the end of the tendon directly into the tunnel.  We could watch it under direct vision.  Once this was done, we tied this FiberWire stitch down and excellent repair had been achieved.  The arm could be put through a full range of motion with full supination, pronation and extension.  At this point, we irrigated the wound thoroughly, suctioned it dry and then closed the  skin in layers.  A sterile compressive dressing was applied as well as a Steri-Strips after the subcuticular closure and a posterior splint was applied.  Then, the patient was taken to the recovery room, he was noted to be in satisfactory condition.  Estimated blood loss for the procedure was minimal.     Harvie JuniorJohn L. Sade Watkins, M.D.     Elijah PlumberJLG/MEDQ  D:  02/21/2016  T:  02/21/2016  Job:  [id number]

## 2016-02-21 NOTE — Brief Op Note (Signed)
02/19/2016  12:49 PM  PATIENT:  Ezequiel GanserWilliam C Criscione  50 y.o. male  PRE-OPERATIVE DIAGNOSIS:  Left distal biceps partial tear   POST-OPERATIVE DIAGNOSIS:  Left distal biceps partial tear   PROCEDURE:  Procedure(s): DISTAL BICEPS TENDON REPAIR (Left)  SURGEON:  Surgeon(s) and Role:    * Jodi GeraldsJohn Shaday Rayborn, MD - Primary  PHYSICIAN ASSISTANT:   ASSISTANTS: bethune   ANESTHESIA:   general  EBL:     BLOOD ADMINISTERED:none  DRAINS: none   LOCAL MEDICATIONS USED:  MARCAINE     SPECIMEN:  No Specimen  DISPOSITION OF SPECIMEN:  N/A  COUNTS:  YES  TOURNIQUET:   Total Tourniquet Time Documented: Upper Arm (Left) - 71 minutes Total: Upper Arm (Left) - 71 minutes   DICTATION: .Other Dictation: Dictation Number [id number]) [id number]  PLAN OF CARE: Discharge to home after PACU  PATIENT DISPOSITION:  PACU - hemodynamically stable.   Delay start of Pharmacological VTE agent (>24hrs) due to surgical blood loss or risk of bleeding: no

## 2016-03-04 NOTE — Op Note (Signed)
NAME:  Royal HawthornCOMSTOCK, Fareed            ACCOUNT NO.:  [id number]  MEDICAL RECORD NO.:  [id number]  LOCATION:                                 FACILITY:  PHYSICIAN:  Harvie JuniorJohn L. Gwendy Boeder, M.D.   DATE OF BIRTH:  1966-10-18  DATE OF PROCEDURE: DATE OF DISCHARGE:                              OPERATIVE REPORT   ADDENDUM:  The date of the procedure is February 19, 2016 not February 21, 2016.     Harvie JuniorJohn L. Evonda Enge, M.D.     Ranae PlumberJLG/MEDQ  D:  03/03/2016  T:  03/03/2016  Job:  [id number]

## 2016-03-15 ENCOUNTER — Emergency Department (HOSPITAL_COMMUNITY)
Admission: EM | Admit: 2016-03-15 | Discharge: 2016-03-15 | Disposition: A | Discharge status: Home / Self Care | Payer: BLUE CROSS/BLUE SHIELD | Attending: Emergency Medicine | Admitting: Emergency Medicine

## 2016-03-15 ENCOUNTER — Emergency Department (HOSPITAL_COMMUNITY): Payer: BLUE CROSS/BLUE SHIELD

## 2016-03-15 ENCOUNTER — Encounter (HOSPITAL_COMMUNITY): Payer: Self-pay | Admitting: Emergency Medicine

## 2016-03-15 DIAGNOSIS — E785 Hyperlipidemia, unspecified: Secondary | ICD-10-CM | POA: Insufficient documentation

## 2016-03-15 DIAGNOSIS — Z98811 Dental restoration status: Secondary | ICD-10-CM | POA: Diagnosis not present

## 2016-03-15 DIAGNOSIS — Z79899 Other long term (current) drug therapy: Secondary | ICD-10-CM | POA: Insufficient documentation

## 2016-03-15 DIAGNOSIS — Z7984 Long term (current) use of oral hypoglycemic drugs: Secondary | ICD-10-CM | POA: Diagnosis not present

## 2016-03-15 DIAGNOSIS — I2510 Atherosclerotic heart disease of native coronary artery without angina pectoris: Secondary | ICD-10-CM | POA: Diagnosis not present

## 2016-03-15 DIAGNOSIS — L03113 Cellulitis of right upper limb: Secondary | ICD-10-CM | POA: Insufficient documentation

## 2016-03-15 DIAGNOSIS — I10 Essential (primary) hypertension: Secondary | ICD-10-CM | POA: Diagnosis not present

## 2016-03-15 DIAGNOSIS — Z794 Long term (current) use of insulin: Secondary | ICD-10-CM | POA: Insufficient documentation

## 2016-03-15 DIAGNOSIS — Z87891 Personal history of nicotine dependence: Secondary | ICD-10-CM | POA: Insufficient documentation

## 2016-03-15 DIAGNOSIS — M1990 Unspecified osteoarthritis, unspecified site: Secondary | ICD-10-CM | POA: Diagnosis not present

## 2016-03-15 DIAGNOSIS — Z8701 Personal history of pneumonia (recurrent): Secondary | ICD-10-CM | POA: Insufficient documentation

## 2016-03-15 DIAGNOSIS — Z7982 Long term (current) use of aspirin: Secondary | ICD-10-CM | POA: Diagnosis not present

## 2016-03-15 DIAGNOSIS — M25521 Pain in right elbow: Secondary | ICD-10-CM | POA: Diagnosis present

## 2016-03-15 DIAGNOSIS — I252 Old myocardial infarction: Secondary | ICD-10-CM | POA: Diagnosis not present

## 2016-03-15 DIAGNOSIS — Z87442 Personal history of urinary calculi: Secondary | ICD-10-CM | POA: Diagnosis not present

## 2016-03-15 DIAGNOSIS — Z8711 Personal history of peptic ulcer disease: Secondary | ICD-10-CM | POA: Insufficient documentation

## 2016-03-15 DIAGNOSIS — F419 Anxiety disorder, unspecified: Secondary | ICD-10-CM | POA: Diagnosis not present

## 2016-03-15 DIAGNOSIS — M109 Gout, unspecified: Secondary | ICD-10-CM

## 2016-03-15 DIAGNOSIS — E119 Type 2 diabetes mellitus without complications: Secondary | ICD-10-CM | POA: Diagnosis not present

## 2016-03-15 DIAGNOSIS — Z86718 Personal history of other venous thrombosis and embolism: Secondary | ICD-10-CM | POA: Diagnosis not present

## 2016-03-15 DIAGNOSIS — K219 Gastro-esophageal reflux disease without esophagitis: Secondary | ICD-10-CM | POA: Insufficient documentation

## 2016-03-15 DIAGNOSIS — Z91040 Latex allergy status: Secondary | ICD-10-CM | POA: Diagnosis not present

## 2016-03-15 DIAGNOSIS — Z9861 Coronary angioplasty status: Secondary | ICD-10-CM | POA: Diagnosis not present

## 2016-03-15 DIAGNOSIS — M10021 Idiopathic gout, right elbow: Secondary | ICD-10-CM | POA: Insufficient documentation

## 2016-03-15 DIAGNOSIS — Z9889 Other specified postprocedural states: Secondary | ICD-10-CM | POA: Insufficient documentation

## 2016-03-15 LAB — BASIC METABOLIC PANEL
Anion gap: 10 (ref 5–15)
BUN: 20 mg/dL (ref 6–20)
CO2: 24 mmol/L (ref 22–32)
Calcium: 9.6 mg/dL (ref 8.9–10.3)
Chloride: 105 mmol/L (ref 101–111)
Creatinine, Ser: 1.62 mg/dL — ABNORMAL HIGH (ref 0.61–1.24)
GFR calc Af Amer: 56 mL/min — ABNORMAL LOW (ref >60)
GFR calc non Af Amer: 48 mL/min — ABNORMAL LOW (ref >60)
Glucose, Bld: 137 mg/dL — ABNORMAL HIGH (ref 65–99)
Potassium: 4 mmol/L (ref 3.5–5.1)
SODIUM: 139 mmol/L (ref 135–145)

## 2016-03-15 LAB — CBC WITH DIFFERENTIAL/PLATELET
Basophils Absolute: 0 10*3/uL (ref 0.0–0.1)
Basophils Relative: 0 %
EOS ABS: 0.2 10*3/uL (ref 0.0–0.7)
EOS PCT: 2 %
HCT: 38.3 % — ABNORMAL LOW (ref 39.0–52.0)
Hemoglobin: 13.1 g/dL (ref 13.0–17.0)
LYMPHS ABS: 2.4 10*3/uL (ref 0.7–4.0)
Lymphocytes Relative: 25 %
MCH: 30.4 pg (ref 26.0–34.0)
MCHC: 34.2 g/dL (ref 30.0–36.0)
MCV: 88.9 fL (ref 78.0–100.0)
MONO ABS: 0.9 10*3/uL (ref 0.1–1.0)
Monocytes Relative: 9 %
Neutro Abs: 6.2 10*3/uL (ref 1.7–7.7)
Neutrophils Relative %: 64 %
PLATELETS: 254 10*3/uL (ref 150–400)
RBC: 4.31 MIL/uL (ref 4.22–5.81)
RDW: 12.9 % (ref 11.5–15.5)
WBC: 9.6 10*3/uL (ref 4.0–10.5)

## 2016-03-15 LAB — URIC ACID: Uric Acid, Serum: 7.6 mg/dL (ref 4.4–7.6)

## 2016-03-15 MED ORDER — COLCHICINE 0.6 MG PO TABS
0.6000 mg | ORAL_TABLET | Freq: Once | ORAL | Status: AC
  Administered 2016-03-15: 0.6 mg via ORAL
  Filled 2016-03-15: qty 1

## 2016-03-15 MED ORDER — OXYCODONE-ACETAMINOPHEN 5-325 MG PO TABS
2.0000 | ORAL_TABLET | Freq: Once | ORAL | Status: AC
  Administered 2016-03-15: 2 via ORAL
  Filled 2016-03-15: qty 2

## 2016-03-15 MED ORDER — COLCHICINE 0.6 MG PO TABS: 0.6000 mg | ORAL_TABLET | Freq: Every day | ORAL | Status: DC

## 2016-03-15 MED ORDER — PREDNISONE 20 MG PO TABS
60.0000 mg | ORAL_TABLET | Freq: Once | ORAL | Status: AC
  Administered 2016-03-15: 60 mg via ORAL
  Filled 2016-03-15: qty 3

## 2016-03-15 MED ORDER — CEPHALEXIN 500 MG PO CAPS
500.0000 mg | ORAL_CAPSULE | Freq: Once | ORAL | Status: AC
  Administered 2016-03-15: 500 mg via ORAL
  Filled 2016-03-15: qty 1

## 2016-03-15 MED ORDER — OXYCODONE-ACETAMINOPHEN 5-325 MG PO TABS: 2.0000 | ORAL_TABLET | ORAL | Status: DC | PRN

## 2016-03-15 MED ORDER — IBUPROFEN 800 MG PO TABS
800.0000 mg | ORAL_TABLET | Freq: Once | ORAL | Status: AC
  Administered 2016-03-15: 800 mg via ORAL
  Filled 2016-03-15: qty 1

## 2016-03-15 MED ORDER — CEPHALEXIN 500 MG PO CAPS: 500.0000 mg | ORAL_CAPSULE | Freq: Three times a day (TID) | ORAL | Status: DC

## 2016-03-15 NOTE — ED Notes (Signed)
MD at bedside. EDP JAMES 

## 2016-03-15 NOTE — ED Notes (Signed)
Patient transported to X-ray 

## 2016-03-15 NOTE — Discharge Instructions (Signed)
Cellulitis Cellulitis is an infection of the skin and the tissue beneath it. The infected area is usually red and tender. Cellulitis occurs most often in the arms and lower legs.  CAUSES  Cellulitis is caused by bacteria that enter the skin through cracks or cuts in the skin. The most common types of bacteria that cause cellulitis are staphylococci and streptococci. SIGNS AND SYMPTOMS   Redness and warmth.  Swelling.  Tenderness or pain.  Fever. DIAGNOSIS  Your health care provider can usually determine what is wrong based on a physical exam. Blood tests may also be done. TREATMENT  Treatment usually involves taking an antibiotic medicine. HOME CARE INSTRUCTIONS   Take your antibiotic medicine as directed by your health care provider. Finish the antibiotic even if you start to feel better.  Keep the infected arm or leg elevated to reduce swelling.  Apply a warm cloth to the affected area up to 4 times per day to relieve pain.  Take medicines only as directed by your health care provider.  Keep all follow-up visits as directed by your health care provider. SEEK MEDICAL CARE IF:   You notice red streaks coming from the infected area.  Your red area gets larger or turns dark in color.  Your bone or joint underneath the infected area becomes painful after the skin has healed.  Your infection returns in the same area or another area.  You notice a swollen bump in the infected area.  You develop new symptoms.  You have a fever. SEEK IMMEDIATE MEDICAL CARE IF:   You feel very sleepy.  You develop vomiting or diarrhea.  You have a general ill feeling (malaise) with muscle aches and pains.   This information is not intended to replace advice given to you by your health care provider. Make sure you discuss any questions you have with your health care provider.   Document Released: 09/09/2005 Document Revised: 08/21/2015 Document Reviewed: 02/15/2012 Elsevier Interactive  Patient Education 2016 Elsevier Inc.    Gout Gout is an inflammatory arthritis caused by a buildup of uric acid crystals in the joints. Uric acid is a chemical that is normally present in the blood. When the level of uric acid in the blood is too high it can form crystals that deposit in your joints and tissues. This causes joint redness, soreness, and swelling (inflammation). Repeat attacks are common. Over time, uric acid crystals can form into masses (tophi) near a joint, destroying bone and causing disfigurement. Gout is treatable and often preventable. CAUSES  The disease begins with elevated levels of uric acid in the blood. Uric acid is produced by your body when it breaks down a naturally found substance called purines. Certain foods you eat, such as meats and fish, contain high amounts of purines. Causes of an elevated uric acid level include:  Being passed down from parent to child (heredity).  Diseases that cause increased uric acid production (such as obesity, psoriasis, and certain cancers).  Excessive alcohol use.  Diet, especially diets rich in meat and seafood.  Medicines, including certain cancer-fighting medicines (chemotherapy), water pills (diuretics), and aspirin.  Chronic kidney disease. The kidneys are no longer able to remove uric acid well.  Problems with metabolism. Conditions strongly associated with gout include:  Obesity.  High blood pressure.  High cholesterol.  Diabetes. Not everyone with elevated uric acid levels gets gout. It is not understood why some people get gout and others do not. Surgery, joint injury, and eating too much   of certain foods are some of the factors that can lead to gout attacks. SYMPTOMS   An attack of gout comes on quickly. It causes intense pain with redness, swelling, and warmth in a joint.  Fever can occur.  Often, only one joint is involved. Certain joints are more commonly involved:  Base of the big  toe.  Knee.  Ankle.  Wrist.  Finger. Without treatment, an attack usually goes away in a few days to weeks. Between attacks, you usually will not have symptoms, which is different from many other forms of arthritis. DIAGNOSIS  Your caregiver will suspect gout based on your symptoms and exam. In some cases, tests may be recommended. The tests may include:  Blood tests.  Urine tests.  X-rays.  Joint fluid exam. This exam requires a needle to remove fluid from the joint (arthrocentesis). Using a microscope, gout is confirmed when uric acid crystals are seen in the joint fluid. TREATMENT  There are two phases to gout treatment: treating the sudden onset (acute) attack and preventing attacks (prophylaxis).  Treatment of an Acute Attack.  Medicines are used. These include anti-inflammatory medicines or steroid medicines.  An injection of steroid medicine into the affected joint is sometimes necessary.  The painful joint is rested. Movement can worsen the arthritis.  You may use warm or cold treatments on painful joints, depending which works best for you.  Treatment to Prevent Attacks.  If you suffer from frequent gout attacks, your caregiver may advise preventive medicine. These medicines are started after the acute attack subsides. These medicines either help your kidneys eliminate uric acid from your body or decrease your uric acid production. You may need to stay on these medicines for a very long time.  The early phase of treatment with preventive medicine can be associated with an increase in acute gout attacks. For this reason, during the first few months of treatment, your caregiver may also advise you to take medicines usually used for acute gout treatment. Be sure you understand your caregiver's directions. Your caregiver may make several adjustments to your medicine dose before these medicines are effective.  Discuss dietary treatment with your caregiver or dietitian.  Alcohol and drinks high in sugar and fructose and foods such as meat, poultry, and seafood can increase uric acid levels. Your caregiver or dietitian can advise you on drinks and foods that should be limited. HOME CARE INSTRUCTIONS   Do not take aspirin to relieve pain. This raises uric acid levels.  Only take over-the-counter or prescription medicines for pain, discomfort, or fever as directed by your caregiver.  Rest the joint as much as possible. When in bed, keep sheets and blankets off painful areas.  Keep the affected joint raised (elevated).  Apply warm or cold treatments to painful joints. Use of warm or cold treatments depends on which works best for you.  Use crutches if the painful joint is in your leg.  Drink enough fluids to keep your urine clear or pale yellow. This helps your body get rid of uric acid. Limit alcohol, sugary drinks, and fructose drinks.  Follow your dietary instructions. Pay careful attention to the amount of protein you eat. Your daily diet should emphasize fruits, vegetables, whole grains, and fat-free or low-fat milk products. Discuss the use of coffee, vitamin C, and cherries with your caregiver or dietitian. These may be helpful in lowering uric acid levels.  Maintain a healthy body weight. SEEK MEDICAL CARE IF:   You develop diarrhea, vomiting, or   any side effects from medicines.  You do not feel better in 24 hours, or you are getting worse. SEEK IMMEDIATE MEDICAL CARE IF:   Your joint becomes suddenly more tender, and you have chills or a fever. MAKE SURE YOU:   Understand these instructions.  Will watch your condition.  Will get help right away if you are not doing well or get worse.   This information is not intended to replace advice given to you by your health care provider. Make sure you discuss any questions you have with your health care provider.   Document Released: 11/27/2000 Document Revised: 12/21/2014 Document Reviewed:  07/13/2012 Elsevier Interactive Patient Education 2016 Elsevier Inc.  

## 2016-03-15 NOTE — ED Notes (Addendum)
Sent from [facility] for R elbow pain and swelling, no injury. Noted some redness and warmth. Pt has tried ice with no relief.

## 2016-03-15 NOTE — ED Provider Notes (Signed)
CSN: [id number]     Arrival date & time 03/15/16  1032 History   First MD Initiated Contact with Patient 03/15/16 1220     Chief Complaint  Patient presents with  . Elbow Pain     HPI  Patient presents for evaluation of redness and pain on the outside of his right elbow. He states the history of gout. His had gout in his knee and ankle. Never had an upper extremity. No injury. Does not feel ill. No fever shakes chills nausea headache or prostration. Noticed some discomfort and pain in the outside of his right elbow over the last few days. Had no sedated and was visually more reddened. Seen at the walk-in clinic at [facility], referred here for further evaluation.  Past Medical History  Diagnosis Date  . Hyperlipidemia   . CAD (coronary artery disease)   . Overweight(278.02)   . Pilonidal cyst 05/2012    area is open, not draining  . Anxiety   . S/P coronary artery stent placement 2001, 05/2011  . Hypertension     under control, has been on med. > 7 yrs.  . Seasonal allergies   . Dental crowns present   . Nephrolithiasis     states has 3 stones in kidney, but no current problems  . Anger     anger issues  . History of concussion   . History of peptic ulcer age 1  . History of DVT (deep vein thrombosis) 03/2005  . PONV (postoperative nausea and vomiting)   . Pneumonia     hx  . Diabetes mellitus     IDDM - fasting 70-120s  . GERD (gastroesophageal reflux disease)   . Arthritis   . Myocardial infarction [facility])     stents   Past Surgical History  Procedure Laterality Date  . Kidney stone surgery  1988 - 1998    x 3  . Knee cartilage surgery  10/2003, 11/2003    Also had bursa removed  . Wrist surgery  02/1997    right  . Coronary angioplasty with stent placement      x 2; last time 05/2011  . Cardiac catheterization  10/06/2010  . Chondroplasty  08/04/2004    right knee  . Prepatellar bursa excision  12/05/2004    right  . Pilonidal cyst drainage  05/19/2012    Procedure:  IRRIGATION AND DEBRIDEMENT PILONIDAL CYST;  Surgeon: Robyne Askew, MD;  Location: [facility];  Service: General;  Laterality: N/A;  I & D Pilonidal abscess, placement of acell  . Knee arthroscopic      left knee  . Cholecystectomy N/A 08/18/2013    Procedure: LAPAROSCOPIC CHOLECYSTECTOMY WITH INTRAOPERATIVE CHOLANGIOGRAM;  Surgeon: Robyne Askew, MD;  Location: [facility];  Service: General;  Laterality: N/A;  . Carpal tunnel release Right   . Distal biceps tendon repair Left 02/19/2016    Procedure: DISTAL BICEPS TENDON REPAIR;  Surgeon: Jodi Geralds, MD;  Location: [facility];  Service: Orthopedics;  Laterality: Left;   Family History  Problem Relation Age of Onset  . Hypertension Father   . Alcohol abuse Father 66  . Diabetes Mother 62   Social History  Substance Use Topics  . Smoking status: Former Smoker    Quit date: 02/24/1984  . Smokeless tobacco: Never Used  . Alcohol Use: Yes     Comment: rare    Review of Systems  Constitutional: Negative for fever, chills, diaphoresis, appetite change and  fatigue.  HENT: Negative for mouth sores, sore throat and trouble swallowing.   Eyes: Negative for visual disturbance.  Respiratory: Negative for cough, chest tightness, shortness of breath and wheezing.   Cardiovascular: Negative for chest pain.  Gastrointestinal: Negative for nausea, vomiting, abdominal pain, diarrhea and abdominal distention.  Endocrine: Negative for polydipsia, polyphagia and polyuria.  Genitourinary: Negative for dysuria, frequency and hematuria.  Musculoskeletal: Positive for arthralgias. Negative for gait problem.  Skin: Positive for color change. Negative for pallor and rash.  Neurological: Negative for dizziness, syncope, light-headedness and headaches.  Hematological: Does not bruise/bleed easily.  Psychiatric/Behavioral: Negative for behavioral problems and confusion.      Allergies  Latex  Home Medications   Prior to  Admission medications   Medication Sig Start Date End Date Taking? Authorizing Provider  ALPRAZolam Prudy Feeler) 1 MG tablet Take 1-2 mg by mouth 3 (three) times daily as needed for anxiety.     Historical Provider, MD  aspirin 81 MG tablet Take 81 mg by mouth daily.    Historical Provider, MD  cephALEXin (KEFLEX) 500 MG capsule Take 1 capsule (500 mg total) by mouth 3 (three) times daily. 03/15/16   Rolland Porter, MD  colchicine 0.6 MG tablet Take 1 tablet (0.6 mg total) by mouth daily. 03/15/16   Rolland Porter, MD  gabapentin (NEURONTIN) 600 MG tablet Take 600 mg by mouth 2 (two) times daily.     Historical Provider, MD  gemfibrozil (LOPID) 600 MG tablet Take 600 mg by mouth 2 (two) times daily before a meal.    Historical Provider, MD  insulin NPH (HUMULIN N,NOVOLIN N) 100 UNIT/ML injection Inject into the skin 2 (two) times daily. Takes 50u am and 50u pm    Historical Provider, MD  lisinopril (PRINIVIL,ZESTRIL) 5 MG tablet Take 5 mg by mouth daily.    Historical Provider, MD  metFORMIN (GLUCOPHAGE) 1000 MG tablet Take 1,000 mg by mouth 2 (two) times daily with a meal.    Historical Provider, MD  metoprolol (LOPRESSOR) 50 MG tablet Take 50 mg by mouth 2 (two) times daily.    Historical Provider, MD  nitroGLYCERIN (NITROSTAT) 0.4 MG SL tablet Place 0.4 mg under the tongue every 5 (five) minutes as needed. x3 doses as needed for chest pain    Historical Provider, MD  omeprazole (PRILOSEC) 20 MG capsule Take 20 mg by mouth 2 (two) times daily before a meal.    Historical Provider, MD  oxyCODONE-acetaminophen (PERCOCET/ROXICET) 5-325 MG tablet Take 2 tablets by mouth every 4 (four) hours as needed. 03/15/16   Rolland Porter, MD  pravastatin (PRAVACHOL) 80 MG tablet Take 80 mg by mouth daily.    Historical Provider, MD  zolpidem (AMBIEN) 10 MG tablet Take 10 mg by mouth at bedtime.     Historical Provider, MD   BP 126/91 mmHg  Pulse 82  Temp(Src) 98.5 F (36.9 C) (Oral)  Resp 16  Ht  (1.803 m)  Wt 266 lb  (120.657 kg)  BMI 37.12 kg/m2  SpO2 100% Physical Exam  Constitutional: He is oriented to person, place, and time. He appears well-developed and well-nourished. No distress.  HENT:  Head: Normocephalic.  Eyes: Conjunctivae are normal. Pupils are equal, round, and reactive to light. No scleral icterus.  Neck: Normal range of motion. Neck supple. No thyromegaly present.  Cardiovascular: Normal rate and regular rhythm.  Exam reveals no gallop and no friction rub.   No murmur heard. Pulmonary/Chest: Effort normal and breath sounds normal. No respiratory distress.  He has no wheezes. He has no rales.  Abdominal: Soft. Bowel sounds are normal. He exhibits no distension. There is no tenderness. There is no rebound.  Musculoskeletal: Normal range of motion.       Arms: Neurological: He is alert and oriented to person, place, and time.  Skin: Skin is warm and dry. No rash noted.  Psychiatric: He has a normal mood and affect. His behavior is normal.    ED Course  Procedures (including critical care time) Labs Review Labs Reviewed  CBC WITH DIFFERENTIAL/PLATELET - Abnormal; Notable for the following:    HCT 38.3 (*)    All other components within normal limits  BASIC METABOLIC PANEL - Abnormal; Notable for the following:    Glucose, Bld 137 (*)    Creatinine, Ser 1.62 (*)    GFR calc non Af Amer 48 (*)    GFR calc Af Amer 56 (*)    All other components within normal limits  URIC ACID    Imaging Review Dg Elbow Complete Right  03/15/2016  CLINICAL DATA:  Right elbow pain this morning.  History of gout. EXAM: RIGHT ELBOW - COMPLETE 3+ VIEW COMPARISON:  None. FINDINGS: Osseous alignment is normal. No acute or suspicious osseous lesion. No fracture line or displaced fracture fragment. No erosions or other secondary signs of inflammatory arthritis. No osteophytes or other signs of degenerative osteoarthritis. Faint curvilinear calcifications are seen posterior to the olecranon, presumably related  to previous joint effusion and/or bursitis. No evidence of joint effusion at this time. Soft tissues about the right elbow are unremarkable. IMPRESSION: 1. No acute osseous or soft tissue abnormality seen. No osseous erosions or other secondary signs of inflammatory arthritis. 2. 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 No evidence of joint effusion is seen at this time. Electronically Signed   By: Bary RichardStan  Maynard M.D.   On: 03/15/2016 13:47   I have personally reviewed and evaluated these images and lab results as part of my medical decision-making.   EKG Interpretation None      MDM   Final diagnoses:  Acute gout of right elbow, unspecified cause  Cellulitis of right upper extremity   Differential diagnosis would include septic joint, gout, saline this,. X-ray shows a calcification in the area of the bursa. But no other frank. Reticular calcifications of suggest pseudogout. He is not febrile, and does not have leukocytosis. May be cellulitis, but doubt intra-articular infection with his range of motion, and without fever or leukocytes. Uric acid level is normal buddies had document of gout before. Plan will be prednisone as patient cannot have anti-inflammatories because of azotemia. Percocet, Keflex, colchicine, primary care follow-up. ER with worsening.    Rolland PorterMark Jobany Montellano, MD 03/15/16 [phone]

## 2016-08-05 ENCOUNTER — Other Ambulatory Visit: Payer: Self-pay | Admitting: Orthopedic Surgery

## 2016-08-05 DIAGNOSIS — M67912 Unspecified disorder of synovium and tendon, left shoulder: Secondary | ICD-10-CM

## 2016-08-15 ENCOUNTER — Ambulatory Visit
Admission: RE | Admit: 2016-08-15 | Discharge: 2016-08-15 | Disposition: A | Discharge status: Home / Self Care | Payer: BLUE CROSS/BLUE SHIELD | Source: Ambulatory Visit | Attending: Orthopedic Surgery | Admitting: Orthopedic Surgery

## 2016-08-15 DIAGNOSIS — M67912 Unspecified disorder of synovium and tendon, left shoulder: Secondary | ICD-10-CM

## 2016-08-28 ENCOUNTER — Other Ambulatory Visit: Payer: Self-pay | Admitting: Family Medicine

## 2016-08-28 ENCOUNTER — Ambulatory Visit
Admission: RE | Admit: 2016-08-28 | Discharge: 2016-08-28 | Disposition: A | Discharge status: Home / Self Care | Payer: BLUE CROSS/BLUE SHIELD | Source: Ambulatory Visit | Attending: Family Medicine | Admitting: Family Medicine

## 2016-08-28 DIAGNOSIS — Z01818 Encounter for other preprocedural examination: Secondary | ICD-10-CM

## 2017-03-05 ENCOUNTER — Other Ambulatory Visit: Payer: Self-pay

## 2017-03-05 ENCOUNTER — Ambulatory Visit
Admission: RE | Admit: 2017-03-05 | Discharge: 2017-03-05 | Disposition: A | Discharge status: Home / Self Care | Payer: BLUE CROSS/BLUE SHIELD | Source: Ambulatory Visit | Attending: *Deleted | Admitting: *Deleted

## 2017-03-05 DIAGNOSIS — M67912 Unspecified disorder of synovium and tendon, left shoulder: Secondary | ICD-10-CM

## 2017-06-23 ENCOUNTER — Encounter (HOSPITAL_COMMUNITY): Payer: Self-pay

## 2017-06-23 ENCOUNTER — Emergency Department (HOSPITAL_COMMUNITY)
Admission: EM | Admit: 2017-06-23 | Discharge: 2017-06-23 | Disposition: A | Discharge status: Home / Self Care | Payer: BLUE CROSS/BLUE SHIELD | Attending: Emergency Medicine | Admitting: Emergency Medicine

## 2017-06-23 ENCOUNTER — Emergency Department (HOSPITAL_COMMUNITY): Payer: BLUE CROSS/BLUE SHIELD

## 2017-06-23 DIAGNOSIS — Z794 Long term (current) use of insulin: Secondary | ICD-10-CM | POA: Insufficient documentation

## 2017-06-23 DIAGNOSIS — R109 Unspecified abdominal pain: Secondary | ICD-10-CM | POA: Diagnosis present

## 2017-06-23 DIAGNOSIS — I10 Essential (primary) hypertension: Secondary | ICD-10-CM | POA: Diagnosis not present

## 2017-06-23 DIAGNOSIS — N201 Calculus of ureter: Secondary | ICD-10-CM

## 2017-06-23 DIAGNOSIS — Z87891 Personal history of nicotine dependence: Secondary | ICD-10-CM | POA: Diagnosis not present

## 2017-06-23 DIAGNOSIS — E119 Type 2 diabetes mellitus without complications: Secondary | ICD-10-CM | POA: Insufficient documentation

## 2017-06-23 DIAGNOSIS — Z79899 Other long term (current) drug therapy: Secondary | ICD-10-CM | POA: Insufficient documentation

## 2017-06-23 DIAGNOSIS — I2510 Atherosclerotic heart disease of native coronary artery without angina pectoris: Secondary | ICD-10-CM | POA: Insufficient documentation

## 2017-06-23 DIAGNOSIS — Z91040 Latex allergy status: Secondary | ICD-10-CM | POA: Diagnosis not present

## 2017-06-23 LAB — URINALYSIS, ROUTINE W REFLEX MICROSCOPIC
BILIRUBIN URINE: NEGATIVE
Glucose, UA: >=500 mg/dL — AB
KETONES UR: NEGATIVE mg/dL
LEUKOCYTES UA: NEGATIVE
NITRITE: NEGATIVE
PROTEIN: 30 mg/dL — AB
Specific Gravity, Urine: 1.022 (ref 1.005–1.030)
Squamous Epithelial / LPF: NONE SEEN
pH: 6 (ref 5.0–8.0)

## 2017-06-23 LAB — CBC WITH DIFFERENTIAL/PLATELET
BASOS ABS: 0 10*3/uL (ref 0.0–0.1)
Basophils Relative: 0 %
Eosinophils Absolute: 0.1 10*3/uL (ref 0.0–0.7)
Eosinophils Relative: 1 %
HEMATOCRIT: 38 % — AB (ref 39.0–52.0)
Hemoglobin: 13.2 g/dL (ref 13.0–17.0)
LYMPHS ABS: 1.6 10*3/uL (ref 0.7–4.0)
LYMPHS PCT: 16 %
MCH: 29.8 pg (ref 26.0–34.0)
MCHC: 34.7 g/dL (ref 30.0–36.0)
MCV: 85.8 fL (ref 78.0–100.0)
MONO ABS: 0.6 10*3/uL (ref 0.1–1.0)
Monocytes Relative: 6 %
NEUTROS ABS: 7.8 10*3/uL — AB (ref 1.7–7.7)
Neutrophils Relative %: 77 %
Platelets: 221 10*3/uL (ref 150–400)
RBC: 4.43 MIL/uL (ref 4.22–5.81)
RDW: 13 % (ref 11.5–15.5)
WBC: 10.2 10*3/uL (ref 4.0–10.5)

## 2017-06-23 LAB — COMPREHENSIVE METABOLIC PANEL
ALT: 80 U/L — ABNORMAL HIGH (ref 17–63)
ANION GAP: 10 (ref 5–15)
AST: 63 U/L — AB (ref 15–41)
Albumin: 3.8 g/dL (ref 3.5–5.0)
Alkaline Phosphatase: 100 U/L (ref 38–126)
BUN: 20 mg/dL (ref 6–20)
CO2: 24 mmol/L (ref 22–32)
Calcium: 9.1 mg/dL (ref 8.9–10.3)
Chloride: 101 mmol/L (ref 101–111)
Creatinine, Ser: 1.9 mg/dL — ABNORMAL HIGH (ref 0.61–1.24)
GFR, EST AFRICAN AMERICAN: 46 mL/min — AB (ref >60)
GFR, EST NON AFRICAN AMERICAN: 39 mL/min — AB (ref >60)
Glucose, Bld: 317 mg/dL — ABNORMAL HIGH (ref 65–99)
POTASSIUM: 4 mmol/L (ref 3.5–5.1)
Sodium: 135 mmol/L (ref 135–145)
Total Bilirubin: 0.7 mg/dL (ref 0.3–1.2)
Total Protein: 7.1 g/dL (ref 6.5–8.1)

## 2017-06-23 LAB — LIPASE, BLOOD: Lipase: 23 U/L (ref 11–51)

## 2017-06-23 MED ORDER — TAMSULOSIN HCL 0.4 MG PO CAPS: 0.4000 mg | ORAL_CAPSULE | Freq: Every day | ORAL | 0 refills | Status: DC

## 2017-06-23 MED ORDER — IOPAMIDOL (ISOVUE-300) INJECTION 61%
80.0000 mL | Freq: Once | INTRAVENOUS | Status: AC | PRN
  Administered 2017-06-23: 80 mL via INTRAVENOUS

## 2017-06-23 MED ORDER — SODIUM CHLORIDE 0.9 % IV SOLN
Freq: Once | INTRAVENOUS | Status: AC
  Administered 2017-06-23: 13:00:00 via INTRAVENOUS

## 2017-06-23 MED ORDER — OXYCODONE HCL 5 MG PO TABS
5.0000 mg | ORAL_TABLET | Freq: Once | ORAL | Status: AC
  Administered 2017-06-23: 5 mg via ORAL
  Filled 2017-06-23: qty 1

## 2017-06-23 MED ORDER — IOPAMIDOL (ISOVUE-300) INJECTION 61%
INTRAVENOUS | Status: AC
  Administered 2017-06-23: 80 mL via INTRAVENOUS
  Filled 2017-06-23: qty 100

## 2017-06-23 MED ORDER — OXYCODONE HCL 5 MG PO CAPS: 5.0000 mg | ORAL_CAPSULE | ORAL | 0 refills | Status: DC | PRN

## 2017-06-23 MED ORDER — MORPHINE SULFATE (PF) 4 MG/ML IV SOLN
4.0000 mg | Freq: Once | INTRAVENOUS | Status: AC
  Administered 2017-06-23: 4 mg via INTRAVENOUS
  Filled 2017-06-23: qty 1

## 2017-06-23 MED ORDER — ONDANSETRON 4 MG PO TBDP: 4.0000 mg | ORAL_TABLET | Freq: Three times a day (TID) | ORAL | 0 refills | Status: DC | PRN

## 2017-06-23 MED ORDER — ONDANSETRON HCL 4 MG/2ML IJ SOLN
4.0000 mg | Freq: Once | INTRAMUSCULAR | Status: AC
  Administered 2017-06-23: 4 mg via INTRAVENOUS
  Filled 2017-06-23: qty 2

## 2017-06-23 NOTE — ED Provider Notes (Signed)
[facility]-EMERGENCY DEPT Provider Note   CSN: [id number] Arrival date & time: 06/23/17  1131     History   Chief Complaint Chief Complaint  Patient presents with  . Abdominal Pain    HPI Elijah Watkins is a 51 y.o. male.  The history is provided by the patient and medical records. No language interpreter was used.  Abdominal Pain   Associated symptoms include nausea and constipation. Pertinent negatives include diarrhea and vomiting.   Elijah Watkins is a 51 y.o. male  with a PMH of DM, CAD, GERD, HTN who presents to the Emergency Department complaining of acute onset of sharp, periumbilical abdominal pain which began today. Pain will intermittently radiate to the RLQ. Intermittent nausea. No vomiting, but was dry heaving this morning before ER arrival. Denies history of similar sxs. Prior lap chole. No other abdominal surgeries. Had a BM about 5:30 this morning which was smaller and more firm than usual. No blood in stool. No other sick contacts. No recent traveling/camping. No fevers, chills, loose stools, chest pain, back pain, shortness of breath or urinary symptoms.   Past Medical History:  Diagnosis Date  . Anger    anger issues  . Anxiety   . Arthritis   . CAD (coronary artery disease)   . Dental crowns present   . Diabetes mellitus    IDDM - fasting 70-120s  . GERD (gastroesophageal reflux disease)   . History of concussion   . History of DVT (deep vein thrombosis) 03/2005  . History of peptic ulcer age 64  . Hyperlipidemia   . Hypertension    under control, has been on med. > 7 yrs.  . Myocardial infarction (HCC)    stents  . Nephrolithiasis    states has 3 stones in kidney, but no current problems  . Overweight(278.02)   . Pilonidal cyst 05/2012   area is open, not draining  . Pneumonia    hx  . PONV (postoperative nausea and vomiting)   . S/P coronary artery stent placement 2001, 05/2011  . Seasonal allergies     Patient Active Problem List   Diagnosis Date Noted  . Gallstones 08/02/2013  . Diabetes mellitus 02/24/2012  . Myocardial infarct, old 02/24/2012  . Coronary artery disease 02/24/2012  . Hypertension 02/24/2012  . Gout 02/24/2012  . Obesity (BMI 30-39.9) 02/24/2012  . Depression 02/24/2012  . Pilonidal abscess 02/24/2012  . PAIN IN LIMB 01/01/2011    Past Surgical History:  Procedure Laterality Date  . CARDIAC CATHETERIZATION  10/06/2010  . CARPAL TUNNEL RELEASE Right   . CHOLECYSTECTOMY N/A 08/18/2013   Procedure: LAPAROSCOPIC CHOLECYSTECTOMY WITH INTRAOPERATIVE CHOLANGIOGRAM;  Surgeon: Robyne Askew, MD;  Location: [facility] OR;  Service: General;  Laterality: N/A;  . CHONDROPLASTY  08/04/2004   right knee  . CORONARY ANGIOPLASTY WITH STENT PLACEMENT     x 2; last time 05/2011  . DISTAL BICEPS TENDON REPAIR Left 02/19/2016   Procedure: DISTAL BICEPS TENDON REPAIR;  Surgeon: Jodi Geralds, MD;  Location: [facility];  Service: Orthopedics;  Laterality: Left;  . KIDNEY STONE SURGERY  1988 - 1998   x 3  . knee arthroscopic     left knee  . KNEE CARTILAGE SURGERY  10/2003, 11/2003   Also had bursa removed  . PILONIDAL CYST DRAINAGE  05/19/2012   Procedure: IRRIGATION AND DEBRIDEMENT PILONIDAL CYST;  Surgeon: Robyne Askew, MD;  Location: [facility];  Service: General;  Laterality: N/A;  I & D Pilonidal abscess, placement of acell  . PREPATELLAR BURSA EXCISION  12/05/2004   right  . WRIST SURGERY  02/1997   right       Home Medications    Prior to Admission medications   Medication Sig Start Date End Date Taking? Authorizing Provider  ALPRAZolam Prudy Feeler) 1 MG tablet Take 2 mg by mouth 2 (two) times daily.    Yes [provider]  atorvastatin (LIPITOR) 40 MG tablet Take 40 mg by mouth daily. 06/22/17  Yes [provider]  bisacodyl (DULCOLAX) 5 MG EC tablet Take 5 mg by mouth once.   Yes [provider]  fenofibrate 54 MG tablet Take 54 mg by mouth daily.  06/05/17  Yes [provider]  gabapentin (NEURONTIN) 600 MG tablet Take 600 mg by mouth 2 (two) times daily.    Yes [provider]  HYDROcodone-acetaminophen (NORCO/VICODIN) 5-325 MG tablet Take 1 tablet by mouth every 6 (six) hours as needed for moderate pain.   Yes [provider]  insulin NPH (HUMULIN N,NOVOLIN N) 100 UNIT/ML injection Inject 80 Units into the skin 2 (two) times daily.    Yes [provider]  insulin regular (NOVOLIN R,HUMULIN R) 100 units/mL injection Inject 10-20 Units into the skin 2 (two) times daily before a meal.   Yes [provider]  lisinopril (PRINIVIL,ZESTRIL) 5 MG tablet Take 5 mg by mouth daily.   Yes [provider]  metoprolol (LOPRESSOR) 50 MG tablet Take 50 mg by mouth 2 (two) times daily.   Yes [provider]  nitroGLYCERIN (NITROSTAT) 0.4 MG SL tablet Place 0.4 mg under the tongue every 5 (five) minutes as needed. x3 doses as needed for chest pain   Yes [provider]  omeprazole (PRILOSEC) 20 MG capsule Take 20 mg by mouth 2 (two) times daily before a meal.   Yes [provider]  zolpidem (AMBIEN) 10 MG tablet Take 10 mg by mouth at bedtime.    Yes [provider]  ondansetron (ZOFRAN ODT) 4 MG disintegrating tablet Take 1 tablet (4 mg total) by mouth every 8 (eight) hours as needed for nausea or vomiting. 06/23/17   Ward, Chase Picket, PA-C  oxycodone (OXY-IR) 5 MG capsule Take 1 capsule (5 mg total) by mouth every 4 (four) hours as needed. 06/23/17   Ward, Chase Picket, PA-C  tamsulosin (FLOMAX) 0.4 MG CAPS capsule Take 1 capsule (0.4 mg total) by mouth daily. 06/23/17   Ward, Chase Picket, PA-C    Family History Family History  Problem Relation Age of Onset  . Diabetes Mother 14  . Hypertension Father   . Alcohol abuse Father 55    Social History Social History  Substance Use Topics  . Smoking status: Former Smoker    Quit date: 02/24/1984  . Smokeless  tobacco: Never Used  . Alcohol use Yes     Comment: rare     Allergies   Latex   Review of Systems Review of Systems  Gastrointestinal: Positive for abdominal pain, constipation and nausea. Negative for blood in stool, diarrhea and vomiting.  All other systems reviewed and are negative.    Physical Exam Updated Vital Signs BP (!) 152/92   Pulse 74   Temp 97.7 F (36.5 C) (Oral)   Resp 16   SpO2 96%   Physical Exam  Constitutional: He is oriented to person, place, and time. He appears well-developed and well-nourished. No distress.  HENT:  Head: Normocephalic and atraumatic.  Cardiovascular: Normal rate, regular rhythm and normal heart sounds.   No murmur heard. Pulmonary/Chest: Effort normal and breath sounds normal. No respiratory distress.  Abdominal: Soft. Bowel sounds are normal. He exhibits no distension. There is tenderness.    Tenderness as depicted in image. No rebound or guarding.   Musculoskeletal: He exhibits no edema.  Neurological: He is alert and oriented to person, place, and time.  Skin: Skin is warm and dry.  Nursing note and vitals reviewed.    ED Treatments / Results  Labs (all labs ordered are listed, but only abnormal results are displayed) Labs Reviewed  COMPREHENSIVE METABOLIC PANEL - Abnormal; Notable for the following:       Result Value   Glucose, Bld 317 (*)    Creatinine, Ser 1.90 (*)    AST 63 (*)    ALT 80 (*)    GFR calc non Af Amer 39 (*)    GFR calc Af Amer 46 (*)    All other components within normal limits  CBC WITH DIFFERENTIAL/PLATELET - Abnormal; Notable for the following:    HCT 38.0 (*)    Neutro Abs 7.8 (*)    All other components within normal limits  URINALYSIS, ROUTINE W REFLEX MICROSCOPIC - Abnormal; Notable for the following:    Color, Urine STRAW (*)    Glucose, UA >=500 (*)    Hgb urine dipstick SMALL (*)    Protein, ur 30 (*)    Bacteria, UA RARE (*)    All other components within normal limits    LIPASE, BLOOD    EKG  EKG Interpretation None       Radiology Ct Abdomen Pelvis W Contrast  Result Date: 06/23/2017 CLINICAL DATA:  Severe pain below umbilicus and with palpation in RIGHT lower quadrant, nausea and vomiting this morning since 0530 hours, history hyperlipidemia, coronary artery disease post MI and PTCA, diabetes mellitus, GERD, nephrolithiasis EXAM: CT ABDOMEN AND PELVIS WITH CONTRAST TECHNIQUE: Multidetector CT imaging of the abdomen and pelvis was performed using the standard protocol following bolus administration of intravenous contrast. Sagittal and coronal MPR images reconstructed from axial data set. CONTRAST:  80mL ISOVUE-300 IOPAMIDOL (ISOVUE-300) INJECTION 61% IV. No oral contrast. COMPARISON:  09/19/2013 FINDINGS: Lower chest: Minimally bibasilar atelectasis. Hepatobiliary: Gallbladder surgically absent. Large area of irregular low-attenuation in the central liver at the mediolateral segments of the LEFT lobe, overall 8.7 x 4.7 x 4.2 cm, likely representing geographic fatty infiltration, not well seen on the previous exam but partially present on an earlier study of 07/22/2013. Some of these areas measure as low as fluid attenuation. Scattered additional low-attenuation foci in both the LEFT and RIGHT lobes are noted, nonspecific and new. Pancreas: Normal appearance Spleen: Normal appearance Adrenals/Urinary Tract: Adrenal glands normal appearance. RIGHT kidney and ureter unremarkable. Chronic dilatation of the collecting system at the upper pole of the LEFT kidney with a large associated calculus 26 x 9 x 12 mm and associated marked upper pole renal cortical thinning. Additional calculi at inferior pole of LEFT kidney up to 13 mm diameter. LEFT hydronephrosis and hydroureter terminating at a 6 mm distal LEFT ureteral calculus image 87. Bladder unremarkable. Stomach/Bowel: Normal appendix. Stomach and bowel loops unremarkable for technique. Vascular/Lymphatic: Few pelvic  phleboliths. Aorta normal caliber. No aneurysm or adenopathy. Reproductive: Prostate gland and seminal vessels unremarkable. Other: Small LEFT inguinal hernia containing fat. No free air or free fluid. No definite inflammatory process. Musculoskeletal: No acute osseous findings. IMPRESSION: LEFT hydronephrosis and hydroureter secondary to  a 6 mm distal LEFT ureteral calculus. Multiple additional LEFT renal calculi with marked chronic dilatation of the upper pole LEFT kidney associated with renal cortical thinning. Geographic fatty infiltration of liver. Small LEFT inguinal hernia containing fat. Electronically Signed   By: Ulyses SouthwardMark  Boles M.D.   On: 06/23/2017 15:10    Procedures Procedures (including critical care time)  Medications Ordered in ED Medications  oxyCODONE (Oxy IR/ROXICODONE) immediate release tablet 5 mg (not administered)  0.9 %  sodium chloride infusion ( Intravenous New Bag/Given 06/23/17 1310)  morphine 4 MG/ML injection 4 mg (4 mg Intravenous Given 06/23/17 1310)  ondansetron (ZOFRAN) injection 4 mg (4 mg Intravenous Given 06/23/17 1310)  iopamidol (ISOVUE-300) 61 % injection 80 mL (80 mLs Intravenous Contrast Given 06/23/17 1435)     Initial Impression / Assessment and Plan / ED Course  I have reviewed the triage vital signs and the nursing notes.  Pertinent labs & imaging results that were available during my care of the patient were reviewed by me and considered in my medical decision making (see chart for details).    Elijah Watkins is a 51 y.o. male who presents to ED for periumbilical abdominal pain. CT scan revealing left ureteral calculus. Labs and urine reassuring. Patient does have creatinine of 1.9. Baseline appears to be between 1.5-2.5. Will hold on NSAID's due to kidney function. Pain and nausea controlled in ED. He is tolerating PO. He has a urologist whom he agrees to follow up with. Home care instructions and return precautions discussed. All questions answered.    Final Clinical Impressions(s) / ED Diagnoses   Final diagnoses:  Ureteral calculi    New Prescriptions New Prescriptions   ONDANSETRON (ZOFRAN ODT) 4 MG DISINTEGRATING TABLET    Take 1 tablet (4 mg total) by mouth every 8 (eight) hours as needed for nausea or vomiting.   OXYCODONE (OXY-IR) 5 MG CAPSULE    Take 1 capsule (5 mg total) by mouth every 4 (four) hours as needed.   TAMSULOSIN (FLOMAX) 0.4 MG CAPS CAPSULE    Take 1 capsule (0.4 mg total) by mouth daily.     Ward, Chase PicketJaime Pilcher, PA-C 06/23/17 1630    Tilden Fossaees, Elizabeth, MD 06/24/17 1050

## 2017-06-23 NOTE — ED Triage Notes (Signed)
He c/o periumbilical area abd. Pain since ~0530 today. He arrives in no distress.

## 2017-06-23 NOTE — Discharge Instructions (Signed)
It was my pleasure taking care of you today!   You have been diagnosed with kidney stones. Drink plenty of fluids to help you pass the stone.  Use your pain medication as directed and only as needed for severe pain. Taking flomax as directed will also help to pass the stone. Use Zofran for nausea as directed.  Follow up with your urology clinic listed in regards to your hospital visit.   Return to the ED immediately if you develop fever that persists > 101, uncontrolled pain or vomiting, or other concerns.   Do not drink alcohol, drive or participate in any other potentially dangerous activities while taking opiate pain medication as it may make you sleepy. Do not take this medication with any other sedating medications, either prescription or over-the-counter. This medication is an opiate (or narcotic) pain medication and can be habit forming.  Use it as little as possible to achieve adequate pain control.  Do not use or use it with extreme caution if you have a history of opiate abuse or dependence. This medication is intended for your use only - do not give any to anyone else and keep it in a secure place where nobody else, especially children, have access to it. It will also cause or worsen constipation, so you may want to consider taking an over-the-counter stool softener while you are taking this medication.

## 2017-06-23 NOTE — ED Notes (Signed)
Bed: WA05 Expected date:  Expected time:  Means of arrival:  Comments: EMS/abd pain 

## 2017-06-23 NOTE — ED Triage Notes (Signed)
Pt is from home.  Pt was awakened from a sleep at about 5am with abdominal pain around his umbilicus and radiates down to his groin.  Pt rates pain 10/10.  Pt reports that pain is better when he pushes on it.  Denies Hx of abdominal surgery or prior hernia.  Also denies lifting anything heavy recently.

## 2018-08-19 ENCOUNTER — Encounter (HOSPITAL_COMMUNITY): Payer: Self-pay | Admitting: Emergency Medicine

## 2018-08-19 ENCOUNTER — Inpatient Hospital Stay (HOSPITAL_COMMUNITY)
Admission: EM | Admit: 2018-08-19 | Discharge: 2018-08-23 | DRG: 247 | Disposition: A | Discharge status: Home / Self Care | Payer: BLUE CROSS/BLUE SHIELD | Attending: Internal Medicine | Admitting: Internal Medicine

## 2018-08-19 ENCOUNTER — Emergency Department (HOSPITAL_COMMUNITY): Payer: BLUE CROSS/BLUE SHIELD

## 2018-08-19 DIAGNOSIS — Z8249 Family history of ischemic heart disease and other diseases of the circulatory system: Secondary | ICD-10-CM

## 2018-08-19 DIAGNOSIS — Z955 Presence of coronary angioplasty implant and graft: Secondary | ICD-10-CM

## 2018-08-19 DIAGNOSIS — T82855A Stenosis of coronary artery stent, initial encounter: Secondary | ICD-10-CM | POA: Diagnosis present

## 2018-08-19 DIAGNOSIS — Z79899 Other long term (current) drug therapy: Secondary | ICD-10-CM

## 2018-08-19 DIAGNOSIS — I2510 Atherosclerotic heart disease of native coronary artery without angina pectoris: Secondary | ICD-10-CM | POA: Diagnosis present

## 2018-08-19 DIAGNOSIS — E876 Hypokalemia: Secondary | ICD-10-CM | POA: Diagnosis not present

## 2018-08-19 DIAGNOSIS — I10 Essential (primary) hypertension: Secondary | ICD-10-CM | POA: Diagnosis present

## 2018-08-19 DIAGNOSIS — E1165 Type 2 diabetes mellitus with hyperglycemia: Secondary | ICD-10-CM | POA: Diagnosis present

## 2018-08-19 DIAGNOSIS — R079 Chest pain, unspecified: Secondary | ICD-10-CM | POA: Diagnosis not present

## 2018-08-19 DIAGNOSIS — Z833 Family history of diabetes mellitus: Secondary | ICD-10-CM

## 2018-08-19 DIAGNOSIS — Z794 Long term (current) use of insulin: Secondary | ICD-10-CM

## 2018-08-19 DIAGNOSIS — I25110 Atherosclerotic heart disease of native coronary artery with unstable angina pectoris: Secondary | ICD-10-CM | POA: Diagnosis not present

## 2018-08-19 DIAGNOSIS — M1990 Unspecified osteoarthritis, unspecified site: Secondary | ICD-10-CM | POA: Diagnosis present

## 2018-08-19 DIAGNOSIS — K219 Gastro-esophageal reflux disease without esophagitis: Secondary | ICD-10-CM | POA: Diagnosis present

## 2018-08-19 DIAGNOSIS — Z8711 Personal history of peptic ulcer disease: Secondary | ICD-10-CM

## 2018-08-19 DIAGNOSIS — I252 Old myocardial infarction: Secondary | ICD-10-CM

## 2018-08-19 DIAGNOSIS — Z87891 Personal history of nicotine dependence: Secondary | ICD-10-CM

## 2018-08-19 DIAGNOSIS — F419 Anxiety disorder, unspecified: Secondary | ICD-10-CM | POA: Diagnosis present

## 2018-08-19 DIAGNOSIS — I129 Hypertensive chronic kidney disease with stage 1 through stage 4 chronic kidney disease, or unspecified chronic kidney disease: Secondary | ICD-10-CM | POA: Diagnosis present

## 2018-08-19 DIAGNOSIS — N183 Chronic kidney disease, stage 3 unspecified: Secondary | ICD-10-CM | POA: Diagnosis present

## 2018-08-19 DIAGNOSIS — Z6841 Body Mass Index (BMI) 40.0 and over, adult: Secondary | ICD-10-CM

## 2018-08-19 DIAGNOSIS — Z87442 Personal history of urinary calculi: Secondary | ICD-10-CM

## 2018-08-19 DIAGNOSIS — Z86718 Personal history of other venous thrombosis and embolism: Secondary | ICD-10-CM

## 2018-08-19 DIAGNOSIS — E785 Hyperlipidemia, unspecified: Secondary | ICD-10-CM | POA: Diagnosis present

## 2018-08-19 DIAGNOSIS — N529 Male erectile dysfunction, unspecified: Secondary | ICD-10-CM | POA: Diagnosis present

## 2018-08-19 DIAGNOSIS — I200 Unstable angina: Secondary | ICD-10-CM

## 2018-08-19 DIAGNOSIS — E1122 Type 2 diabetes mellitus with diabetic chronic kidney disease: Secondary | ICD-10-CM | POA: Diagnosis present

## 2018-08-19 DIAGNOSIS — E1129 Type 2 diabetes mellitus with other diabetic kidney complication: Secondary | ICD-10-CM | POA: Diagnosis present

## 2018-08-19 DIAGNOSIS — Z8701 Personal history of pneumonia (recurrent): Secondary | ICD-10-CM

## 2018-08-19 LAB — BASIC METABOLIC PANEL
Anion gap: 8 (ref 5–15)
BUN: 18 mg/dL (ref 6–20)
CALCIUM: 9.1 mg/dL (ref 8.9–10.3)
CO2: 24 mmol/L (ref 22–32)
CREATININE: 1.54 mg/dL — AB (ref 0.61–1.24)
Chloride: 106 mmol/L (ref 98–111)
GFR calc Af Amer: 58 mL/min — ABNORMAL LOW (ref >60)
GFR, EST NON AFRICAN AMERICAN: 50 mL/min — AB (ref >60)
Glucose, Bld: 265 mg/dL — ABNORMAL HIGH (ref 70–99)
POTASSIUM: 3.7 mmol/L (ref 3.5–5.1)
SODIUM: 138 mmol/L (ref 135–145)

## 2018-08-19 LAB — I-STAT TROPONIN, ED
TROPONIN I, POC: 0.02 ng/mL (ref 0.00–0.08)
TROPONIN I, POC: 0.05 ng/mL (ref 0.00–0.08)

## 2018-08-19 LAB — CBC
HEMATOCRIT: 39.3 % (ref 39.0–52.0)
Hemoglobin: 13.2 g/dL (ref 13.0–17.0)
MCH: 29.5 pg (ref 26.0–34.0)
MCHC: 33.6 g/dL (ref 30.0–36.0)
MCV: 87.7 fL (ref 78.0–100.0)
PLATELETS: 203 10*3/uL (ref 150–400)
RBC: 4.48 MIL/uL (ref 4.22–5.81)
RDW: 12.6 % (ref 11.5–15.5)
WBC: 9 10*3/uL (ref 4.0–10.5)

## 2018-08-19 NOTE — ED Triage Notes (Signed)
Pt presents with [facility] from home with c/o CP that began after working out, midsternal; took 1 NTG tab with improvement from pain of 9/10, EMS gave 3rd NTG and pain now 2/10; EMS also gave 324mg ; pt has hx of MI with stent placement

## 2018-08-19 NOTE — ED Provider Notes (Signed)
[facility] EMERGENCY DEPARTMENT Provider Note   CSN: [id number] Arrival date & time: 08/19/18  1648     History   Chief Complaint Chief Complaint  Patient presents with  . Chest Pain    HPI Elijah Watkins is a 52 y.o. male.  Patient with history of MI, last in 2012 with stents, HLD, HTN, PUD, DVT, DM, GERD presents with chest pain that started while at the gym earlier today. He was slow walking on the treadmill and started having substernal chest pain that was similar to previous MI. No significant SOB. No nausea/vomiting. He stopped working out, went home and took a SL NTG with brief relief of pain, which returned while at rest shortly after. He took a second NTG without any relief and called EMS. The third NTG was given and he states his pain went to 2/10. He reports return of pain while walking back to the room from the lobby. No pain currently. He states he started testosterone yesterday which is the only recent medication change.    The history is provided by the patient. No language interpreter was used.  Chest Pain   Associated symptoms include shortness of breath (Mild). Pertinent negatives include no abdominal pain, no cough, no diaphoresis, no fever, no nausea and no vomiting.    Past Medical History:  Diagnosis Date  . Anger    anger issues  . Anxiety   . Arthritis   . CAD (coronary artery disease)   . Dental crowns present   . Diabetes mellitus    IDDM - fasting 70-120s  . GERD (gastroesophageal reflux disease)   . History of concussion   . History of DVT (deep vein thrombosis) 03/2005  . History of peptic ulcer age 89  . Hyperlipidemia   . Hypertension    under control, has been on med. > 7 yrs.  . Myocardial infarction (HCC)    stents  . Nephrolithiasis    states has 3 stones in kidney, but no current problems  . Overweight(278.02)   . Pilonidal cyst 05/2012   area is open, not draining  . Pneumonia    hx  . PONV (postoperative  nausea and vomiting)   . S/P coronary artery stent placement 2001, 05/2011  . Seasonal allergies     Patient Active Problem List   Diagnosis Date Noted  . Gallstones 08/02/2013  . Diabetes mellitus 02/24/2012  . Myocardial infarct, old 02/24/2012  . Coronary artery disease 02/24/2012  . Hypertension 02/24/2012  . Gout 02/24/2012  . Obesity (BMI 30-39.9) 02/24/2012  . Depression 02/24/2012  . Pilonidal abscess 02/24/2012  . PAIN IN LIMB 01/01/2011    Past Surgical History:  Procedure Laterality Date  . CARDIAC CATHETERIZATION  10/06/2010  . CARPAL TUNNEL RELEASE Right   . CHOLECYSTECTOMY N/A 08/18/2013   Procedure: LAPAROSCOPIC CHOLECYSTECTOMY WITH INTRAOPERATIVE CHOLANGIOGRAM;  Surgeon: Robyne Askew, MD;  Location: [facility] OR;  Service: General;  Laterality: N/A;  . CHONDROPLASTY  08/04/2004   right knee  . CORONARY ANGIOPLASTY WITH STENT PLACEMENT     x 2; last time 05/2011  . DISTAL BICEPS TENDON REPAIR Left 02/19/2016   Procedure: DISTAL BICEPS TENDON REPAIR;  Surgeon: Jodi Geralds, MD;  Location: [facility];  Service: Orthopedics;  Laterality: Left;  . KIDNEY STONE SURGERY  1988 - 1998   x 3  . knee arthroscopic     left knee  . KNEE CARTILAGE SURGERY  10/2003, 11/2003   Also had  bursa removed  . PILONIDAL CYST DRAINAGE  05/19/2012   Procedure: IRRIGATION AND DEBRIDEMENT PILONIDAL CYST;  Surgeon: Robyne Askew, MD;  Location: [facility];  Service: General;  Laterality: N/A;  I & D Pilonidal abscess, placement of acell  . PREPATELLAR BURSA EXCISION  12/05/2004   right  . WRIST SURGERY  02/1997   right        Home Medications    Prior to Admission medications   Medication Sig Start Date End Date Taking? Authorizing Provider  ALPRAZolam Prudy Feeler) 1 MG tablet Take 2 mg by mouth 2 (two) times daily.     [provider]  atorvastatin (LIPITOR) 40 MG tablet Take 40 mg by mouth daily. 06/22/17   [provider]  bisacodyl (DULCOLAX)  5 MG EC tablet Take 5 mg by mouth once.    [provider]  fenofibrate 54 MG tablet Take 54 mg by mouth daily. 06/05/17   [provider]  gabapentin (NEURONTIN) 600 MG tablet Take 600 mg by mouth 2 (two) times daily.     [provider]  HYDROcodone-acetaminophen (NORCO/VICODIN) 5-325 MG tablet Take 1 tablet by mouth every 6 (six) hours as needed for moderate pain.    [provider]  insulin NPH (HUMULIN N,NOVOLIN N) 100 UNIT/ML injection Inject 80 Units into the skin 2 (two) times daily.     [provider]  insulin regular (NOVOLIN R,HUMULIN R) 100 units/mL injection Inject 10-20 Units into the skin 2 (two) times daily before a meal.    [provider]  lisinopril (PRINIVIL,ZESTRIL) 5 MG tablet Take 5 mg by mouth daily.    [provider]  metoprolol (LOPRESSOR) 50 MG tablet Take 50 mg by mouth 2 (two) times daily.    [provider]  nitroGLYCERIN (NITROSTAT) 0.4 MG SL tablet Place 0.4 mg under the tongue every 5 (five) minutes as needed. x3 doses as needed for chest pain    [provider]  omeprazole (PRILOSEC) 20 MG capsule Take 20 mg by mouth 2 (two) times daily before a meal.    [provider]  ondansetron (ZOFRAN ODT) 4 MG disintegrating tablet Take 1 tablet (4 mg total) by mouth every 8 (eight) hours as needed for nausea or vomiting. 06/23/17   Ward, Chase Picket, PA-C  oxycodone (OXY-IR) 5 MG capsule Take 1 capsule (5 mg total) by mouth every 4 (four) hours as needed. 06/23/17   Ward, Chase Picket, PA-C  tamsulosin (FLOMAX) 0.4 MG CAPS capsule Take 1 capsule (0.4 mg total) by mouth daily. 06/23/17   Ward, Chase Picket, PA-C  zolpidem (AMBIEN) 10 MG tablet Take 10 mg by mouth at bedtime.     [provider]    Family History Family History  Problem Relation Age of Onset  . Diabetes Mother 4  . Hypertension Father   . Alcohol abuse Father 81    Social History Social History    Tobacco Use  . Smoking status: Former Smoker    Last attempt to quit: 02/24/1984    Years since quitting: 34.5  . Smokeless tobacco: Never Used  Substance Use Topics  . Alcohol use: Yes    Comment: rare  . Drug use: No     Allergies   Latex   Review of Systems Review of Systems  Constitutional: Negative for chills, diaphoresis and fever.  HENT: Negative.   Respiratory: Positive for shortness of breath (Mild). Negative for cough.   Cardiovascular: Positive for chest  pain. Negative for leg swelling.  Gastrointestinal: Negative.  Negative for abdominal pain, nausea and vomiting.  Musculoskeletal: Negative.   Skin: Negative.   Neurological: Negative.      Physical Exam Updated Vital Signs BP (!) 152/96 (BP Location: Right Arm)   Pulse 66   Temp 98.7 F (37.1 C) (Oral)   Resp 14   SpO2 99%   Physical Exam  Constitutional: He is oriented to person, place, and time. He appears well-developed and well-nourished.  HENT:  Head: Normocephalic.  Neck: Normal range of motion. Neck supple.  Cardiovascular: Normal rate, regular rhythm and intact distal pulses.  Pulmonary/Chest: Effort normal and breath sounds normal. He has no wheezes. He has no rhonchi. He has no rales.  Abdominal: Soft. Bowel sounds are normal. There is no tenderness. There is no rebound and no guarding.  Musculoskeletal: Normal range of motion.       Right lower leg: He exhibits no edema.       Left lower leg: He exhibits no edema.  Neurological: He is alert and oriented to person, place, and time.  Skin: Skin is warm and dry. No rash noted.  Psychiatric: He has a normal mood and affect.     ED Treatments / Results  Labs (all labs ordered are listed, but only abnormal results are displayed) Labs Reviewed  BASIC METABOLIC PANEL - Abnormal; Notable for the following components:      Result Value   Glucose, Bld 265 (*)    Creatinine, Ser 1.54 (*)    GFR calc non Af Amer 50 (*)    GFR calc Af Amer  58 (*)    All other components within normal limits  CBC  I-STAT TROPONIN, ED  I-STAT TROPONIN, ED   Results for orders placed or performed during the hospital encounter of 08/19/18  Basic metabolic panel  Result Value Ref Range   Sodium 138 135 - 145 mmol/L   Potassium 3.7 3.5 - 5.1 mmol/L   Chloride 106 98 - 111 mmol/L   CO2 24 22 - 32 mmol/L   Glucose, Bld 265 (H) 70 - 99 mg/dL   BUN 18 6 - 20 mg/dL   Creatinine, Ser 1.61 (H) 0.61 - 1.24 mg/dL   Calcium 9.1 8.9 - 09.6 mg/dL   GFR calc non Af Amer 50 (L) >60 mL/min   GFR calc Af Amer 58 (L) >60 mL/min   Anion gap 8 5 - 15  CBC  Result Value Ref Range   WBC 9.0 4.0 - 10.5 K/uL   RBC 4.48 4.22 - 5.81 MIL/uL   Hemoglobin 13.2 13.0 - 17.0 g/dL   HCT 04.5 40.9 - 81.1 %   MCV 87.7 78.0 - 100.0 fL   MCH 29.5 26.0 - 34.0 pg   MCHC 33.6 30.0 - 36.0 g/dL   RDW 91.4 78.2 - 95.6 %   Platelets 203 150 - 400 K/uL  I-stat troponin, ED  Result Value Ref Range   Troponin i, poc 0.02 0.00 - 0.08 ng/mL   Comment 3          I-stat troponin, ED  Result Value Ref Range   Troponin i, poc 0.05 0.00 - 0.08 ng/mL   Comment 3             EKG EKG Interpretation  Date/Time:  Friday August 19 2018 16:51:58 EDT Ventricular Rate:  89 PR Interval:  146 QRS Duration: 94 QT Interval:  366 QTC Calculation: 445 R Axis:   51  Text Interpretation:  Normal sinus rhythm Non-specific ST-t changes Confirmed by Margarita Grizzle [phone]) on 08/19/2018 11:03:20 PM   Radiology Dg Chest 2 View  Result Date: 08/19/2018 CLINICAL DATA:  Onset of chest pain at the gym today EXAM: CHEST - 2 VIEW COMPARISON:  08/28/2016 FINDINGS: Normal heart size, mediastinal contours, and pulmonary vascularity. Lungs clear. No pleural effusion or pneumothorax. Bones unremarkable. IMPRESSION: Normal exam. Electronically Signed   By: Ulyses Southward M.D.   On: 08/19/2018 17:21    Procedures Procedures (including critical care time)  Medications Ordered in ED Medications - No  data to display   Initial Impression / Assessment and Plan / ED Course  I have reviewed the triage vital signs and the nursing notes.  Pertinent labs & imaging results that were available during my care of the patient were reviewed by me and considered in my medical decision making (see chart for details).     Patient with cardiac history (stents for MI in 2011, 2012) presents with exertional chest pain starting earlier today, some relief with NTG. No pain currently. He has had aspirin.   EKG does not show any ischemia. Troponin and delta trop are negative. CXR clear. Cr 1.54, better than what appears to be baseline.   The patient will need to be admitted for further cardiac testing given history of MI and exertional chest pain today. Dr. Rosalia Hammers has seen the patient and is consulting cardiology who will see the patient in the ED to evaluate for admission.    Final Clinical Impressions(s) / ED Diagnoses   Final diagnoses:  None   1. Exertional Chest pain 2. History of CAD with MI  ED Discharge Orders    None       Elpidio Anis, Cordelia Poche 08/20/18 0002    Margarita Grizzle, MD 08/20/18 [phone]

## 2018-08-19 NOTE — ED Notes (Addendum)
Pt wanting to leave.  Encouraged pt to stay for 2nd troponin and explained reasoning.  Spoke to [place], [place] concerning same and she will order.

## 2018-08-19 NOTE — ED Notes (Signed)
Updated on wait for treatment room. 

## 2018-08-20 ENCOUNTER — Encounter (HOSPITAL_COMMUNITY): Payer: Self-pay | Admitting: Internal Medicine

## 2018-08-20 ENCOUNTER — Other Ambulatory Visit: Payer: Self-pay

## 2018-08-20 DIAGNOSIS — Z794 Long term (current) use of insulin: Secondary | ICD-10-CM | POA: Diagnosis not present

## 2018-08-20 DIAGNOSIS — E1165 Type 2 diabetes mellitus with hyperglycemia: Secondary | ICD-10-CM | POA: Diagnosis present

## 2018-08-20 DIAGNOSIS — F419 Anxiety disorder, unspecified: Secondary | ICD-10-CM | POA: Diagnosis present

## 2018-08-20 DIAGNOSIS — K219 Gastro-esophageal reflux disease without esophagitis: Secondary | ICD-10-CM | POA: Diagnosis present

## 2018-08-20 DIAGNOSIS — I129 Hypertensive chronic kidney disease with stage 1 through stage 4 chronic kidney disease, or unspecified chronic kidney disease: Secondary | ICD-10-CM | POA: Diagnosis present

## 2018-08-20 DIAGNOSIS — Z6841 Body Mass Index (BMI) 40.0 and over, adult: Secondary | ICD-10-CM | POA: Diagnosis not present

## 2018-08-20 DIAGNOSIS — N183 Chronic kidney disease, stage 3 unspecified: Secondary | ICD-10-CM | POA: Diagnosis present

## 2018-08-20 DIAGNOSIS — E785 Hyperlipidemia, unspecified: Secondary | ICD-10-CM | POA: Diagnosis present

## 2018-08-20 DIAGNOSIS — R079 Chest pain, unspecified: Secondary | ICD-10-CM | POA: Diagnosis present

## 2018-08-20 DIAGNOSIS — M1990 Unspecified osteoarthritis, unspecified site: Secondary | ICD-10-CM | POA: Diagnosis present

## 2018-08-20 DIAGNOSIS — Z79899 Other long term (current) drug therapy: Secondary | ICD-10-CM | POA: Diagnosis not present

## 2018-08-20 DIAGNOSIS — E876 Hypokalemia: Secondary | ICD-10-CM | POA: Diagnosis not present

## 2018-08-20 DIAGNOSIS — Z87891 Personal history of nicotine dependence: Secondary | ICD-10-CM | POA: Diagnosis not present

## 2018-08-20 DIAGNOSIS — Z955 Presence of coronary angioplasty implant and graft: Secondary | ICD-10-CM | POA: Diagnosis not present

## 2018-08-20 DIAGNOSIS — Z8701 Personal history of pneumonia (recurrent): Secondary | ICD-10-CM | POA: Diagnosis not present

## 2018-08-20 DIAGNOSIS — I252 Old myocardial infarction: Secondary | ICD-10-CM | POA: Diagnosis not present

## 2018-08-20 DIAGNOSIS — E1122 Type 2 diabetes mellitus with diabetic chronic kidney disease: Secondary | ICD-10-CM | POA: Diagnosis present

## 2018-08-20 DIAGNOSIS — I200 Unstable angina: Secondary | ICD-10-CM | POA: Diagnosis not present

## 2018-08-20 DIAGNOSIS — Z8711 Personal history of peptic ulcer disease: Secondary | ICD-10-CM | POA: Diagnosis not present

## 2018-08-20 DIAGNOSIS — N529 Male erectile dysfunction, unspecified: Secondary | ICD-10-CM | POA: Diagnosis present

## 2018-08-20 DIAGNOSIS — Z87442 Personal history of urinary calculi: Secondary | ICD-10-CM | POA: Diagnosis not present

## 2018-08-20 DIAGNOSIS — T82855A Stenosis of coronary artery stent, initial encounter: Secondary | ICD-10-CM | POA: Diagnosis present

## 2018-08-20 DIAGNOSIS — Z833 Family history of diabetes mellitus: Secondary | ICD-10-CM | POA: Diagnosis not present

## 2018-08-20 DIAGNOSIS — I25110 Atherosclerotic heart disease of native coronary artery with unstable angina pectoris: Secondary | ICD-10-CM | POA: Diagnosis present

## 2018-08-20 DIAGNOSIS — Z86718 Personal history of other venous thrombosis and embolism: Secondary | ICD-10-CM | POA: Diagnosis not present

## 2018-08-20 DIAGNOSIS — I10 Essential (primary) hypertension: Secondary | ICD-10-CM | POA: Diagnosis not present

## 2018-08-20 DIAGNOSIS — I209 Angina pectoris, unspecified: Secondary | ICD-10-CM | POA: Diagnosis not present

## 2018-08-20 LAB — LIPID PANEL
CHOL/HDL RATIO: 5.3 ratio
Cholesterol: 123 mg/dL (ref 0–200)
HDL: 23 mg/dL — AB (ref >40)
LDL CALC: 30 mg/dL (ref 0–99)
Triglycerides: 349 mg/dL — ABNORMAL HIGH (ref <150)
VLDL: 70 mg/dL — ABNORMAL HIGH (ref 0–40)

## 2018-08-20 LAB — GLUCOSE, CAPILLARY
Glucose-Capillary: 241 mg/dL — ABNORMAL HIGH (ref 70–99)
Glucose-Capillary: 269 mg/dL — ABNORMAL HIGH (ref 70–99)
Glucose-Capillary: 261 mg/dL — ABNORMAL HIGH (ref 70–99)
Glucose-Capillary: 194 mg/dL — ABNORMAL HIGH (ref 70–99)
Glucose-Capillary: 272 mg/dL — ABNORMAL HIGH (ref 70–99)

## 2018-08-20 LAB — HEMOGLOBIN A1C
Hgb A1c MFr Bld: 10.4 % — ABNORMAL HIGH (ref 4.8–5.6)
Mean Plasma Glucose: 251.78 mg/dL

## 2018-08-20 LAB — RAPID URINE DRUG SCREEN, HOSP PERFORMED
Amphetamines: NOT DETECTED
BARBITURATES: NOT DETECTED
Benzodiazepines: POSITIVE — AB
COCAINE: NOT DETECTED
Opiates: NOT DETECTED
Tetrahydrocannabinol: NOT DETECTED

## 2018-08-20 LAB — TROPONIN I
TROPONIN I: 0.05 ng/mL — AB (ref <0.03)
TROPONIN I: 0.04 ng/mL — AB (ref <0.03)
Troponin I: 0.03 ng/mL (ref <0.03)

## 2018-08-20 LAB — HIV ANTIBODY (ROUTINE TESTING W REFLEX): HIV Screen 4th Generation wRfx: NONREACTIVE

## 2018-08-20 MED ORDER — SODIUM CHLORIDE 0.9% FLUSH: 3.0000 mL | INTRAVENOUS | Status: DC | PRN

## 2018-08-20 MED ORDER — CHLORHEXIDINE GLUCONATE 0.12 % MT SOLN
15.0000 mL | Freq: Two times a day (BID) | OROMUCOSAL | Status: DC
  Administered 2018-08-20 – 2018-08-22 (×4): 15 mL via OROMUCOSAL
  Filled 2018-08-20 (×6): qty 15

## 2018-08-20 MED ORDER — SODIUM CHLORIDE 0.9 % WEIGHT BASED INFUSION
1.0000 mL/kg/h | INTRAVENOUS | Status: DC
  Administered 2018-08-22 (×2): 1 mL/kg/h via INTRAVENOUS

## 2018-08-20 MED ORDER — ASPIRIN 81 MG PO CHEW
81.0000 mg | CHEWABLE_TABLET | ORAL | Status: AC
  Administered 2018-08-22: 81 mg via ORAL
  Filled 2018-08-20: qty 1

## 2018-08-20 MED ORDER — ORAL CARE MOUTH RINSE
15.0000 mL | Freq: Two times a day (BID) | OROMUCOSAL | Status: DC
  Administered 2018-08-21 – 2018-08-22 (×3): 15 mL via OROMUCOSAL

## 2018-08-20 MED ORDER — INSULIN ASPART 100 UNIT/ML ~~LOC~~ SOLN
0.0000 [IU] | Freq: Three times a day (TID) | SUBCUTANEOUS | Status: DC
  Administered 2018-08-20 (×2): 5 [IU] via SUBCUTANEOUS
  Administered 2018-08-20: 2 [IU] via SUBCUTANEOUS
  Administered 2018-08-21 (×2): 5 [IU] via SUBCUTANEOUS
  Administered 2018-08-22 (×2): 3 [IU] via SUBCUTANEOUS
  Administered 2018-08-22: 2 [IU] via SUBCUTANEOUS
  Administered 2018-08-23: 3 [IU] via SUBCUTANEOUS
  Administered 2018-08-23: 12:00:00 2 [IU] via SUBCUTANEOUS

## 2018-08-20 MED ORDER — SODIUM CHLORIDE 0.9 % IV SOLN: 250.0000 mL | INTRAVENOUS | Status: DC | PRN

## 2018-08-20 MED ORDER — NITROGLYCERIN 0.4 MG SL SUBL
0.4000 mg | SUBLINGUAL_TABLET | SUBLINGUAL | Status: DC | PRN
Start: 2018-08-20 — End: 2018-08-23

## 2018-08-20 MED ORDER — SODIUM CHLORIDE 0.9 % IV SOLN
INTRAVENOUS | Status: DC
  Administered 2018-08-20 – 2018-08-21 (×2): via INTRAVENOUS

## 2018-08-20 MED ORDER — ZOLPIDEM TARTRATE 5 MG PO TABS
10.0000 mg | ORAL_TABLET | Freq: Every day | ORAL | Status: DC
  Administered 2018-08-20 – 2018-08-22 (×3): 10 mg via ORAL
  Filled 2018-08-20 (×3): qty 2

## 2018-08-20 MED ORDER — INSULIN ASPART PROT & ASPART (70-30 MIX) 100 UNIT/ML ~~LOC~~ SUSP
60.0000 [IU] | Freq: Two times a day (BID) | SUBCUTANEOUS | Status: DC
  Administered 2018-08-20: 60 [IU] via SUBCUTANEOUS
  Filled 2018-08-20: qty 10

## 2018-08-20 MED ORDER — SODIUM CHLORIDE 0.9% FLUSH
3.0000 mL | Freq: Two times a day (BID) | INTRAVENOUS | Status: DC
  Administered 2018-08-21 – 2018-08-22 (×2): 3 mL via INTRAVENOUS

## 2018-08-20 MED ORDER — MORPHINE SULFATE (PF) 2 MG/ML IV SOLN: 2.0000 mg | INTRAVENOUS | Status: DC | PRN

## 2018-08-20 MED ORDER — ENOXAPARIN SODIUM 40 MG/0.4ML ~~LOC~~ SOLN
40.0000 mg | SUBCUTANEOUS | Status: DC
  Administered 2018-08-20 – 2018-08-23 (×4): 40 mg via SUBCUTANEOUS
  Filled 2018-08-20 (×4): qty 0.4

## 2018-08-20 MED ORDER — PANTOPRAZOLE SODIUM 40 MG PO TBEC
40.0000 mg | DELAYED_RELEASE_TABLET | Freq: Every day | ORAL | Status: DC
  Administered 2018-08-20 – 2018-08-23 (×4): 40 mg via ORAL
  Filled 2018-08-20 (×4): qty 1

## 2018-08-20 MED ORDER — HYDROCODONE-ACETAMINOPHEN 5-325 MG PO TABS: 1.0000 | ORAL_TABLET | Freq: Four times a day (QID) | ORAL | Status: DC | PRN

## 2018-08-20 MED ORDER — LISINOPRIL 5 MG PO TABS
5.0000 mg | ORAL_TABLET | Freq: Every day | ORAL | Status: DC
  Administered 2018-08-20 – 2018-08-23 (×4): 5 mg via ORAL
  Filled 2018-08-20 (×4): qty 1

## 2018-08-20 MED ORDER — ONDANSETRON HCL 4 MG/2ML IJ SOLN: 4.0000 mg | Freq: Four times a day (QID) | INTRAMUSCULAR | Status: DC | PRN

## 2018-08-20 MED ORDER — ATORVASTATIN CALCIUM 80 MG PO TABS
80.0000 mg | ORAL_TABLET | Freq: Every day | ORAL | Status: DC
  Administered 2018-08-20 – 2018-08-22 (×3): 80 mg via ORAL
  Filled 2018-08-20: qty 1
  Filled 2018-08-20 (×2): qty 4

## 2018-08-20 MED ORDER — HYDRALAZINE HCL 20 MG/ML IJ SOLN: 5.0000 mg | INTRAMUSCULAR | Status: DC | PRN

## 2018-08-20 MED ORDER — ASPIRIN EC 81 MG PO TBEC
81.0000 mg | DELAYED_RELEASE_TABLET | Freq: Every day | ORAL | Status: DC
  Administered 2018-08-20 – 2018-08-22 (×3): 81 mg via ORAL
  Filled 2018-08-20 (×3): qty 1

## 2018-08-20 MED ORDER — INSULIN ASPART PROT & ASPART (70-30 MIX) 100 UNIT/ML ~~LOC~~ SUSP
45.0000 [IU] | Freq: Every day | SUBCUTANEOUS | Status: DC
  Administered 2018-08-21 – 2018-08-23 (×2): 45 [IU] via SUBCUTANEOUS
  Filled 2018-08-20: qty 10

## 2018-08-20 MED ORDER — GABAPENTIN 600 MG PO TABS
600.0000 mg | ORAL_TABLET | Freq: Three times a day (TID) | ORAL | Status: DC
  Administered 2018-08-20 – 2018-08-23 (×10): 600 mg via ORAL
  Filled 2018-08-20 (×10): qty 1

## 2018-08-20 MED ORDER — SODIUM CHLORIDE 0.9 % WEIGHT BASED INFUSION: 3.0000 mL/kg/h | INTRAVENOUS | Status: AC

## 2018-08-20 MED ORDER — ATORVASTATIN CALCIUM 20 MG PO TABS: 40.0000 mg | ORAL_TABLET | Freq: Every day | ORAL | Status: DC

## 2018-08-20 MED ORDER — ALPRAZOLAM 0.25 MG PO TABS
1.0000 mg | ORAL_TABLET | Freq: Three times a day (TID) | ORAL | Status: DC | PRN
  Administered 2018-08-20: 1 mg via ORAL
  Administered 2018-08-20 – 2018-08-22 (×5): 2 mg via ORAL
  Filled 2018-08-20 (×3): qty 8
  Filled 2018-08-20 (×2): qty 4
  Filled 2018-08-20 (×2): qty 8

## 2018-08-20 MED ORDER — INSULIN ASPART PROT & ASPART (70-30 MIX) 100 UNIT/ML ~~LOC~~ SUSP
30.0000 [IU] | Freq: Every day | SUBCUTANEOUS | Status: DC
  Administered 2018-08-20: 30 [IU] via SUBCUTANEOUS
  Filled 2018-08-20: qty 10

## 2018-08-20 MED ORDER — ACETAMINOPHEN 325 MG PO TABS: 650.0000 mg | ORAL_TABLET | ORAL | Status: DC | PRN

## 2018-08-20 MED ORDER — METOPROLOL TARTRATE 25 MG PO TABS
50.0000 mg | ORAL_TABLET | Freq: Two times a day (BID) | ORAL | Status: DC
  Administered 2018-08-20 – 2018-08-23 (×7): 50 mg via ORAL
  Filled 2018-08-20 (×7): qty 2

## 2018-08-20 NOTE — H&P (Signed)
History and Physical    Elijah Watkins NFA:[id number] DOB: 1965-12-17 DOA: 08/19/2018  Referring MD/NP/PA:   PCP: Tracey Harries, MD   Patient coming from:  The patient is coming from home.  At baseline, pt is independent for most of ADL.  Chief Complaint: Chest pain  HPI: Elijah Watkins is a 52 y.o. male with medical history significant of hypertension, hyperlipidemia, diabetes mellitus, GERD, anxiety, CAD, stent placement, DVT (provoked after knee surgery), PUD, CKD 3, who presents with chest pain.  Pt state that his chest pain started when was doing exercise on the treadmill at about 1:00 PM. It is located in the substernal area, dull, pressure-like pain, nonradiating, 9 out of 10 in severity.  It is associated with mild shortness of breath.  No cough, fever or chills.  No tenderness in the calf areas.  Patient states that his chest pain subsided after stopping exercise. He went home and took a SL NTG with brief relief of pain, which returned while at rest shortly after. He call his cardiologist office.  He was instructed by a triage nurse to have taken NTG without significant relief. He then called EMS. The third NTG and 4 baby ASA were given. He states his pain went to 2/10. He reports return of pain while walking back to the room from the lobby in ED. Patient was treated with morphine in ED.  His chest pain has resolved.  Currently patient does not have chest pain, shortness of breath. Patient denies nausea, vomiting, diarrhea, abdominal pain, symptoms of UTI or unilateral weakness. He states he started testosterone yesterday which is the only recent medication change.    ED Course: pt was found to have negative troponin twice, stable renal function, WBC 9.0, temperature normal, no tachycardia, no tachypnea, oxygen saturation 94% on room air, negative chest x-ray.  Patient is placed on telemetry bed for observation.  Cardiology, Dr. Clarnce Flock was consulted.  Review of Systems:   General:  no fevers, chills, no body weight gain, fatigue HEENT: no blurry vision, hearing changes or sore throat Respiratory: has mild dyspnea, no coughing, wheezing CV: has chest pain, no palpitations GI: no nausea, vomiting, abdominal pain, diarrhea, constipation GU: no dysuria, burning on urination, increased urinary frequency, hematuria  Ext: no leg edema Neuro: no unilateral weakness, numbness, or tingling, no vision change or hearing loss Skin: no rash, no skin tear. MSK: No muscle spasm, no deformity, no limitation of range of movement in spin Heme: No easy bruising.  Travel history: No recent long distant travel.  Allergy:  Allergies  Allergen Reactions  . Latex Hives, Swelling and Other (See Comments)    Dry cough    Past Medical History:  Diagnosis Date  . Anger    anger issues  . Anxiety   . Arthritis   . CAD (coronary artery disease)   . Dental crowns present   . Diabetes mellitus    IDDM - fasting 70-120s  . GERD (gastroesophageal reflux disease)   . History of concussion   . History of DVT (deep vein thrombosis) 03/2005  . History of peptic ulcer age 34  . Hyperlipidemia   . Hypertension    under control, has been on med. > 7 yrs.  . Myocardial infarction (HCC)    stents  . Nephrolithiasis    states has 3 stones in kidney, but no current problems  . Overweight(278.02)   . Pilonidal cyst 05/2012   area is open, not draining  . Pneumonia  hx  . PONV (postoperative nausea and vomiting)   . S/P coronary artery stent placement 2001, 05/2011  . Seasonal allergies     Past Surgical History:  Procedure Laterality Date  . CARDIAC CATHETERIZATION  10/06/2010  . CARPAL TUNNEL RELEASE Right   . CHOLECYSTECTOMY N/A 08/18/2013   Procedure: LAPAROSCOPIC CHOLECYSTECTOMY WITH INTRAOPERATIVE CHOLANGIOGRAM;  Surgeon: Robyne Askew, MD;  Location: [facility] OR;  Service: General;  Laterality: N/A;  . CHONDROPLASTY  08/04/2004   right knee  . CORONARY ANGIOPLASTY WITH STENT  PLACEMENT     x 2; last time 05/2011  . DISTAL BICEPS TENDON REPAIR Left 02/19/2016   Procedure: DISTAL BICEPS TENDON REPAIR;  Surgeon: Jodi Geralds, MD;  Location: [facility];  Service: Orthopedics;  Laterality: Left;  . KIDNEY STONE SURGERY  1988 - 1998   x 3  . knee arthroscopic     left knee  . KNEE CARTILAGE SURGERY  10/2003, 11/2003   Also had bursa removed  . PILONIDAL CYST DRAINAGE  05/19/2012   Procedure: IRRIGATION AND DEBRIDEMENT PILONIDAL CYST;  Surgeon: Robyne Askew, MD;  Location: [facility];  Service: General;  Laterality: N/A;  I & D Pilonidal abscess, placement of acell  . PREPATELLAR BURSA EXCISION  12/05/2004   right  . WRIST SURGERY  02/1997   right    Social History:  reports that he quit smoking about 34 years ago. He has never used smokeless tobacco. He reports that he drinks alcohol. He reports that he does not use drugs.  Family History:  Family History  Problem Relation Age of Onset  . Diabetes Mother 40  . Hypertension Father   . Alcohol abuse Father 18     Prior to Admission medications   Medication Sig Start Date End Date Taking? Authorizing Provider  ALPRAZolam Prudy Feeler) 1 MG tablet Take 1-2 mg by mouth 3 (three) times daily as needed for anxiety.    Yes [provider]  atorvastatin (LIPITOR) 40 MG tablet Take 40 mg by mouth at bedtime.  06/22/17  Yes [provider]  gabapentin (NEURONTIN) 600 MG tablet Take 600 mg by mouth 3 (three) times daily.    Yes [provider]  insulin NPH-regular Human (NOVOLIN 70/30) (70-30) 100 UNIT/ML injection Inject 90 Units into the skin 2 (two) times daily with a meal.   Yes [provider]  lisinopril (PRINIVIL,ZESTRIL) 5 MG tablet Take 5 mg by mouth daily.   Yes [provider]  metoprolol (LOPRESSOR) 50 MG tablet Take 50 mg by mouth 2 (two) times daily.   Yes [provider]  nitroGLYCERIN (NITROSTAT) 0.4 MG SL tablet Place 0.4 mg under  the tongue every 5 (five) minutes as needed. x3 doses as needed for chest pain   Yes [provider]  omeprazole (PRILOSEC) 20 MG capsule Take 20 mg by mouth 2 (two) times daily before a meal.   Yes [provider]  Testosterone 1.62 % GEL Place 1 Pump onto the skin daily.   Yes [provider]  zolpidem (AMBIEN) 10 MG tablet Take 10 mg by mouth at bedtime.    Yes [provider]  HYDROcodone-acetaminophen (NORCO/VICODIN) 5-325 MG tablet Take 1 tablet by mouth every 6 (six) hours as needed for moderate pain.    [provider]  ondansetron (ZOFRAN ODT) 4 MG disintegrating tablet Take 1 tablet (4 mg total) by mouth every 8 (eight) hours as needed for nausea or vomiting. Patient not  taking: Reported on 08/19/2018 06/23/17   Ward, Chase Picket, PA-C  oxycodone (OXY-IR) 5 MG capsule Take 1 capsule (5 mg total) by mouth every 4 (four) hours as needed. Patient not taking: Reported on 08/19/2018 06/23/17   Ward, Chase Picket, PA-C  tamsulosin (FLOMAX) 0.4 MG CAPS capsule Take 1 capsule (0.4 mg total) by mouth daily. Patient not taking: Reported on 08/19/2018 06/23/17   Ward, Chase Picket, PA-C    Physical Exam: Vitals:   08/19/18 2330 08/20/18 0000 08/20/18 0030 08/20/18 0147  BP: (!) 143/91 126/81 116/73 (!) 113/55  Pulse: 80 89 91 72  Resp: 17 17 13 13   Temp:      TempSrc:      SpO2: 97% 96% 94% 97%   General: Not in acute distress HEENT:       Eyes: PERRL, EOMI, no scleral icterus.       ENT: No discharge from the ears and nose, no pharynx injection, no tonsillar enlargement.        Neck: No JVD, no bruit, no mass felt. Heme: No neck lymph node enlargement. Cardiac: S1/S2, RRR, No murmurs, No gallops or rubs. Respiratory: No rales, wheezing, rhonchi or rubs. GI: Soft, nondistended, nontender, no rebound pain, no organomegaly, BS present. GU: No hematuria Ext: No pitting leg edema bilaterally. 2+DP/PT pulse bilaterally. Musculoskeletal: No joint  deformities, No joint redness or warmth, no limitation of ROM in spin. Skin: No rashes.  Neuro: Alert, oriented X3, cranial nerves II-XII grossly intact, moves all extremities normally Psych: Patient is not psychotic, no suicidal or hemocidal ideation.  Labs on Admission: I have personally reviewed following labs and imaging studies  CBC: Recent Labs  Lab 08/19/18 1658  WBC 9.0  HGB 13.2  HCT 39.3  MCV 87.7  PLT 203   Basic Metabolic Panel: Recent Labs  Lab 08/19/18 1658  NA 138  K 3.7  CL 106  CO2 24  GLUCOSE 265*  BUN 18  CREATININE 1.54*  CALCIUM 9.1   GFR: CrCl cannot be calculated (Unknown ideal weight.). Liver Function Tests: No results for input(s): AST, ALT, ALKPHOS, BILITOT, PROT, ALBUMIN in the last 168 hours. No results for input(s): LIPASE, AMYLASE in the last 168 hours. No results for input(s): AMMONIA in the last 168 hours. Coagulation Profile: No results for input(s): INR, PROTIME in the last 168 hours. Cardiac Enzymes: No results for input(s): CKTOTAL, CKMB, CKMBINDEX, TROPONINI in the last 168 hours. BNP (last 3 results) No results for input(s): PROBNP in the last 8760 hours. HbA1C: No results for input(s): HGBA1C in the last 72 hours. CBG: No results for input(s): GLUCAP in the last 168 hours. Lipid Profile: No results for input(s): CHOL, HDL, LDLCALC, TRIG, CHOLHDL, LDLDIRECT in the last 72 hours. Thyroid Function Tests: No results for input(s): TSH, T4TOTAL, FREET4, T3FREE, THYROIDAB in the last 72 hours. Anemia Panel: No results for input(s): VITAMINB12, FOLATE, FERRITIN, TIBC, IRON, RETICCTPCT in the last 72 hours. Urine analysis:    Component Value Date/Time   COLORURINE STRAW (A) 06/23/2017 1311   APPEARANCEUR CLEAR 06/23/2017 1311   LABSPEC 1.022 06/23/2017 1311   PHURINE 6.0 06/23/2017 1311   GLUCOSEU >=500 (A) 06/23/2017 1311   HGBUR SMALL (A) 06/23/2017 1311   BILIRUBINUR NEGATIVE 06/23/2017 1311   KETONESUR NEGATIVE 06/23/2017  1311   PROTEINUR 30 (A) 06/23/2017 1311   UROBILINOGEN 0.2 09/19/2013 1843   NITRITE NEGATIVE 06/23/2017 1311   LEUKOCYTESUR NEGATIVE 06/23/2017 1311   Sepsis Labs: @LABRCNTIP (procalcitonin:4,lacticidven:4) )No results found for this  or any previous visit (from the past 240 hour(s)).   Radiological Exams on Admission: Dg Chest 2 View  Result Date: 08/19/2018 CLINICAL DATA:  Onset of chest pain at the gym today EXAM: CHEST - 2 VIEW COMPARISON:  08/28/2016 FINDINGS: Normal heart size, mediastinal contours, and pulmonary vascularity. Lungs clear. No pleural effusion or pneumothorax. Bones unremarkable. IMPRESSION: Normal exam. Electronically Signed   By: Ulyses Southward M.D.   On: 08/19/2018 17:21     EKG: Independently reviewed.  Sinus rhythm, QTC 445, LAE, T wave flattening, Q waves in lead III, nonspecific ST change.   Assessment/Plan Principal Problem:   Chest pain Active Problems:   Type II diabetes mellitus with renal manifestations (HCC)   Coronary artery disease   Hypertension   HLD (hyperlipidemia)   GERD (gastroesophageal reflux disease)   CKD (chronic kidney disease), stage III (HCC)   Anxiety   Chest pain and hx of CAD: s/p of stent placement. Patient has typical exertional chest pain, likely due to stable angina.  Pt has history of DVT, is at risk of developing PE, but his DVT was provoked after knee surgery.  Today his chest pain is typical exertional chest pain, clinically it is likely due to stable angina.  Patient does not have signs of DVT.  Low suspicious for PE. Cardiology, Dr. Clarnce Flock was consulted, "Will see in am to decide on stress vs direct cath Monday. Dont think there is need for heparin. Cont on ASA. Statin (increase to 80mg ). Cont metoprolol".   - will place on Tele bed for obs - cycle CE q6 x3 and repeat EKG in the am  - prn Nitroglycerin, Morphine, and aspirin, lipitor  - Risk factor stratification: will check FLP, UDS and A1C  - did not order 2d echo-->  please reevaluate pt in AM to decide if pt needs 2D echo.  Type II diabetes mellitus with renal manifestations (HCC): Last A1c 9.7 on 10/04/10, poorly controled. Patient is taking 70/30 insulin at home -will decrease 70/30 insulin dose from from 90 to 60 U bid -SSI -f/u A1c  HTN:  -Continue home medications: Lisinopril, metoprolol -IV hydralazine prn  HLD (hyperlipidemia): -lipitor  GERD: -Protonix  CKD (chronic kidney disease), stage III (HCC): stable.  Baseline creatinine 1.5-1.7.  His creatinine is 1.54, BUN 18. -Follow-up by BMP  Anxiety: -Xanax    DVT ppx: SQ Lovenox Code Status: Full code Family Communication: None at bed side.     Disposition Plan:  Anticipate discharge back to previous home environment Consults called:  Dr. Clarnce Flock Admission status: Obs / tele   Date of Service 08/20/2018    Lorretta Harp [facility] Pager [phone]  If 7PM-7AM, please contact night-coverage [email or web address] Password [email or web address] 08/20/2018, 2:00 AM

## 2018-08-20 NOTE — Progress Notes (Signed)
CRITICAL LAB VALUE TROPONIN: 0.05  RN notified on call NP, [place]. Awaiting call back or new orders.

## 2018-08-20 NOTE — Progress Notes (Signed)
Pharmacy stated to give 70/30 now that pt is eating  Requested time change on next dose

## 2018-08-20 NOTE — Progress Notes (Addendum)
Brief updated history and physical examination    Elijah Watkins  ZOX:[id number] DOB: [birth year] DOA: 08/19/2018 PCP: Tracey Harries, MD    Brief Narrative: Elijah Watkins is a 52 y.o. male with medical history significant of hypertension, hyperlipidemia, diabetes mellitus, GERD, anxiety, CAD, stent placement, DVT (provoked after knee surgery), PUD, CKD 3, who presents with chest pain. Patient started when he was doing exercise on his treadmill.  He usually does not have pain on his exercise routine.  The cardiology office and was instructed to come to the emergency department.  Acquired several doses of sublingual nitroglycerin.  This is new onset unstable angina by definition as the patient has angina with exertion.  Of note patient had one stent placed 13 years ago and another stent proximately 6 years ago.  He was in his mid 30s when he had his first stent placed.  Interestingly his primary care physician has recently prescribed testosterone.  He has taken 2 doses of the testosterone due to problems with impotence. His hemoglobin A1c is 9.7 indicating very poor control of his diabetes.   Assessment & Plan:   Principal Problem:   Unstable angina (HCC) Active Problems:   Type II diabetes mellitus with renal manifestations (HCC)   Coronary artery disease   CKD (chronic kidney disease), stage III (HCC)   Hypertension   HLD (hyperlipidemia)   GERD (gastroesophageal reflux disease)   Anxiety   Unstable Angina and hx of CAD: s/p of stent placement. Patient has exertional chest pain, likely due to unstable angina as this is new.  Pt has history of DVT, is at risk of developing PE, but his DVT was provoked after knee surgery.    Patient does not have signs of DVT.  Low suspicious for PE.  Cardiology has seen the patient and recommended cardiac catheterization on Monday area no echocardiogram necessary at this point.  -Admit as inpatient - prn Nitroglycerin, Morphine, and aspirin,  lipitor  - Risk factor stratification:    - FLP: Indicates a low HDL.  Normal cholesterol and normal LDL.     Patient would benefit from an anti-inflammatory diet.  -Hemoglobin A1c is elevated indicating poor diabetes control.     I had an extensive discussion with the patient regarding intermittent fasting as a control method for his diabetes as well as a low carbohydrate anti-inflammatory diet.     I have recommended that he see a nutritionist and/or consider going to "the metabolism clinic" in [place], [place] where he can have a nonmedication approach to management of his diabetes and weight  -Urine drug screen: Shows benzodiazepines which the patient is prescribed.  No indication of cocaine.   Type II diabetes mellitus with renal manifestations (HCC):  Emo globin A1c 10.4 today even higher than in 2011.  Patient is taking 70/30 insulin at home and reports that his physician is frequently increasing his insulin dosing -will decrease 70/30 insulin dose from from 90 to 60 U bid -SSI -Extensive discussion with the patient regarding dietary modifications and need to get his diabetes under control.  I explained to him that his impotence would improve if he could reverse his all vessel disease which is possible with extensive dietary commitment.  Recommended a plant-based organic diet without any artificial preservatives or sweeteners.  I especially discussed with the patient the need for anti-inflammatory foods especially avoidance of soy, dairy, gluten, and inflammatory oils.  HTN:  -Continue home medications: Lisinopril, metoprolol -IV hydralazine prn  HLD (hyperlipidemia): -lipitor  GERD: -Protonix  CKD (chronic kidney disease), stage III (HCC): stable.  Baseline creatinine 1.5-1.7.  His creatinine is 1.54, BUN 18. -Follow-up by [facility]  Anxiety: -Xanax   DVT prophylaxis: Subcu Lovenox Code Status: Code Family Communication: No family present, patient retains  capacity Disposition Plan: Likely home after cardiac cath  Consultants:   Dr. Antoine Poche from cardiology   Subjective: With no further chest pain.  Reports dietary indiscretion and frequent restaurant and fast food diet.  Past Medical History:  Diagnosis Date  . Anger    anger issues  . Anxiety   . Arthritis   . CAD (coronary artery disease)   . Dental crowns present   . Diabetes mellitus    IDDM - fasting 70-120s  . GERD (gastroesophageal reflux disease)   . History of concussion   . History of DVT (deep vein thrombosis) 03/2005  . History of peptic ulcer age 60  . Hyperlipidemia   . Hypertension    under control, has been on med. > 7 yrs.  . Myocardial infarction (HCC)    stents  . Nephrolithiasis    states has 3 stones in kidney, but no current problems  . Overweight(278.02)   . Pilonidal cyst 05/2012   area is open, not draining  . Pneumonia    hx  . PONV (postoperative nausea and vomiting)   . S/P coronary artery stent placement 2001, 05/2011  . Seasonal allergies    Past Surgical History:  Procedure Laterality Date  . CARDIAC CATHETERIZATION  10/06/2010  . CARPAL TUNNEL RELEASE Right   . CHOLECYSTECTOMY N/A 08/18/2013   Procedure: LAPAROSCOPIC CHOLECYSTECTOMY WITH INTRAOPERATIVE CHOLANGIOGRAM;  Surgeon: Robyne Askew, MD;  Location[facility] OR;  Service: General;  Laterality: N/A;  . CHONDROPLASTY  08/04/2004   right knee  . CORONARY ANGIOPLASTY WITH STENT PLACEMENT     x 2; last time 05/2011  . DISTAL BICEPS TENDON REPAIR Left 02/19/2016   Procedure: DISTAL BICEPS TENDON REPAIR;  Surgeon: Jodi Geralds, MD;  Location: [facility];  Service: Orthopedics;  Laterality: Left;  . KIDNEY STONE SURGERY  1988 - 1998   x 3  . knee arthroscopic     left knee  . KNEE CARTILAGE SURGERY  10/2003, 11/2003   Also had bursa removed  . PILONIDAL CYST DRAINAGE  05/19/2012   Procedure: IRRIGATION AND DEBRIDEMENT PILONIDAL CYST;  Surgeon: Robyne Askew, MD;  Location:  [facility];  Service: General;  Laterality: N/A;  I & D Pilonidal abscess, placement of acell  . PREPATELLAR BURSA EXCISION  12/05/2004   right  . WRIST SURGERY  02/1997   right    Social History   Socioeconomic History  . Marital status: Married    Spouse name: Not on file  . Number of children: Not on file  . Years of education: Not on file  . Highest education level: Not on file  Occupational History  . Not on file  Social Needs  . Financial resource strain: Not on file  . Food insecurity:    Worry: Not on file    Inability: Not on file  . Transportation needs:    Medical: Not on file    Non-medical: Not on file  Tobacco Use  . Smoking status: Former Smoker    Last attempt to quit: 02/24/1984    Years since quitting: 34.5  . Smokeless tobacco: Never Used  Substance and Sexual Activity  . Alcohol use: Yes    Comment:  rare  . Drug use: No  . Sexual activity: Not on file  Lifestyle  . Physical activity:    Days per week: Not on file    Minutes per session: Not on file  . Stress: Not on file  Relationships  . Social connections:    Talks on phone: Not on file    Gets together: Not on file    Attends religious service: Not on file    Active member of club or organization: Not on file    Attends meetings of clubs or organizations: Not on file    Relationship status: Not on file  . Intimate partner violence:    Fear of current or ex partner: Not on file    Emotionally abused: Not on file    Physically abused: Not on file    Forced sexual activity: Not on file  Other Topics Concern  . Not on file  Social History Narrative  . Not on file    Family History  Problem Relation Age of Onset  . Diabetes Mother 12  . Hypertension Father   . Alcohol abuse Father 70    Objective: Vitals:   08/20/18 0229 08/20/18 0235 08/20/18 0714 08/20/18 0800  BP: 129/77  121/80   Pulse: 74  71   Resp: 18  18   Temp: 98 F (36.7 C)  (!) 97.5 F (36.4 C)    TempSrc: Oral  Oral   SpO2: 97%  98%   Weight:  130.9 kg    Height:    5\' 11"  (1.803 m)    Intake/Output Summary (Last 24 hours) at 08/20/2018 1239 Last data filed at 08/20/2018 0753 Gross per 24 hour  Intake 215.6 ml  Output 225 ml  Net -9.4 ml   Filed Weights   08/20/18 0235  Weight: 130.9 kg    Examination:  General exam: Morbidly obese appears calm and comfortable  Respiratory system: Clear to auscultation. Respiratory effort normal. Cardiovascular system: S1 & S2 heard, RRR. No JVD, murmurs, rubs, gallops or clicks. No pedal edema. Gastrointestinal system: Abdomen is nondistended, soft and nontender. No organomegaly or masses felt. Normal bowel sounds heard. Central nervous system: Alert and oriented. No focal neurological deficits. Extremities: Symmetric 5 x 5 power. Skin: No rashes, lesions or ulcers Psychiatry: Judgement and insight appear normal. Mood & affect appropriate.     Data Reviewed: I have personally reviewed following labs and imaging studies  CBC: Recent Labs  Lab 08/19/18 1658  WBC 9.0  HGB 13.2  HCT 39.3  MCV 87.7  PLT 203   Basic Metabolic Panel: Recent Labs  Lab 08/19/18 1658  NA 138  K 3.7  CL 106  CO2 24  GLUCOSE 265*  BUN 18  CREATININE 1.54*  CALCIUM 9.1   GFR: Estimated Creatinine Clearance: 77.4 mL/min (A) (by C-G formula based on SCr of 1.54 mg/dL (H)).  Cardiac Enzymes: Recent Labs  Lab 08/20/18 0145 08/20/18 0620  TROPONINI 0.05* 0.04*   BNP (last 3 results) No results for input(s): PROBNP in the last 8760 hours. HbA1C: Recent Labs    08/20/18 0620  HGBA1C 10.4*   CBG: Recent Labs  Lab 08/20/18 0232 08/20/18 0751 08/20/18 1159  GLUCAP 241* 269* 261*   Lipid Profile: Recent Labs    08/20/18 0620  CHOL 123  HDL 23*  LDLCALC 30  TRIG 161*  CHOLHDL 5.3    No results found for this or any previous visit (from the past 240 hour(s)).  Radiology Studies: Dg Chest 2 View  Result Date:  08/19/2018 CLINICAL DATA:  Onset of chest pain at the gym today EXAM: CHEST - 2 VIEW COMPARISON:  08/28/2016 FINDINGS: Normal heart size, mediastinal contours, and pulmonary vascularity. Lungs clear. No pleural effusion or pneumothorax. Bones unremarkable. IMPRESSION: Normal exam. Electronically Signed   By: Ulyses Southward M.D.   On: 08/19/2018 17:21        Scheduled Meds: . aspirin EC  81 mg Oral Daily  . atorvastatin  80 mg Oral QHS  . chlorhexidine  15 mL Mouth Rinse BID  . enoxaparin (LOVENOX) injection  40 mg Subcutaneous Q24H  . gabapentin  600 mg Oral TID  . insulin aspart  0-9 Units Subcutaneous TID WC  . insulin aspart protamine- aspart  60 Units Subcutaneous BID WC  . lisinopril  5 mg Oral Daily  . mouth rinse  15 mL Mouth Rinse q12n4p  . metoprolol tartrate  50 mg Oral BID  . pantoprazole  40 mg Oral Daily  . zolpidem  10 mg Oral QHS   Continuous Infusions: . sodium chloride 75 mL/hr at 08/20/18 0307     LOS: 0 days    Time spent: 45 minutes    Lahoma Crocker, MD, FACP [facility] Pager [phone] If 7PM-7AM, please contact night-coverage [email or web address] Password [facility] 08/20/2018, 12:39 PM

## 2018-08-20 NOTE — Progress Notes (Signed)
Progress Note  Patient Name: Elijah Watkins Date of Encounter: 08/20/2018  Primary Cardiologist:   No primary care provider on file.  (Sees MD in [facility])   Subjective   No chest pain.  No SOB.    Inpatient Medications    Scheduled Meds: . aspirin EC  81 mg Oral Daily  . atorvastatin  80 mg Oral QHS  . chlorhexidine  15 mL Mouth Rinse BID  . enoxaparin (LOVENOX) injection  40 mg Subcutaneous Q24H  . gabapentin  600 mg Oral TID  . insulin aspart  0-9 Units Subcutaneous TID WC  . insulin aspart protamine- aspart  60 Units Subcutaneous BID WC  . lisinopril  5 mg Oral Daily  . mouth rinse  15 mL Mouth Rinse q12n4p  . metoprolol tartrate  50 mg Oral BID  . pantoprazole  40 mg Oral Daily  . zolpidem  10 mg Oral QHS   Continuous Infusions: . sodium chloride 75 mL/hr at 08/20/18 0307   PRN Meds: acetaminophen, ALPRAZolam, hydrALAZINE, HYDROcodone-acetaminophen, morphine injection, nitroGLYCERIN, ondansetron (ZOFRAN) IV   Vital Signs    Vitals:   08/20/18 0229 08/20/18 0235 08/20/18 0714 08/20/18 0800  BP: 129/77  121/80   Pulse: 74  71   Resp: 18  18   Temp: 98 F (36.7 C)  (!) 97.5 F (36.4 C)   TempSrc: Oral  Oral   SpO2: 97%  98%   Weight:  130.9 kg    Height:    5\' 11"  (1.803 m)    Intake/Output Summary (Last 24 hours) at 08/20/2018 1118 Last data filed at 08/20/2018 0753 Gross per 24 hour  Intake 215.6 ml  Output 225 ml  Net -9.4 ml   Filed Weights   08/20/18 0235  Weight: 130.9 kg    Telemetry    NSR - Personally Reviewed  ECG    NA - Personally Reviewed  Physical Exam   GEN: No acute distress.   Neck: No  JVD Cardiac: RRR, no murmurs, rubs, or gallops.  Respiratory: Clear  to auscultation bilaterally. GI: Soft, nontender, non-distended  MS: No  edema; No deformity. Neuro:  Nonfocal  Psych: Normal affect   Labs    Chemistry Recent Labs  Lab 08/19/18 1658  NA 138  K 3.7  CL 106  CO2 24  GLUCOSE 265*  BUN 18  CREATININE  1.54*  CALCIUM 9.1  GFRNONAA 50*  GFRAA 58*  ANIONGAP 8     Hematology Recent Labs  Lab 08/19/18 1658  WBC 9.0  RBC 4.48  HGB 13.2  HCT 39.3  MCV 87.7  MCH 29.5  MCHC 33.6  RDW 12.6  PLT 203    Cardiac Enzymes Recent Labs  Lab 08/20/18 0145 08/20/18 0620  TROPONINI 0.05* 0.04*    Recent Labs  Lab 08/19/18 1713 08/19/18 2107  TROPIPOC 0.02 0.05     BNPNo results for input(s): BNP, PROBNP in the last 168 hours.   DDimer No results for input(s): DDIMER in the last 168 hours.   Radiology    Dg Chest 2 View  Result Date: 08/19/2018 CLINICAL DATA:  Onset of chest pain at the gym today EXAM: CHEST - 2 VIEW COMPARISON:  08/28/2016 FINDINGS: Normal heart size, mediastinal contours, and pulmonary vascularity. Lungs clear. No pleural effusion or pneumothorax. Bones unremarkable. IMPRESSION: Normal exam. Electronically Signed   By: Ulyses Southward M.D.   On: 08/19/2018 17:21    Cardiac Studies   NA  Patient Profile  52 y.o. male CAD PCI in 2012, HTN, DVT, DM, Presents to ED with new onset chest pain during treadmill exercise.  Assessment & Plan    CHEST PAIN:  New onset exertional pain.  Unstable by that definition.  Trop slightly elevated.    Pain is similar to previous angina.  Needs cardiac cath.  Will put on add on board for Monday.    DM:  A1c without good control.  Per primary team.    For questions or updates, please contact [facility] Please consult [email or web address] for contact info under Cardiology/STEMI.   Signed, Rollene Rotunda, MD  08/20/2018, 11:18 AM

## 2018-08-20 NOTE — Progress Notes (Signed)
Paged MD regarding last dose of 70/30 insulin 60units given at 1218, next dose scheduled for 1700, questioning re timing dose  MD gave verbal order to give 30units with dinner and 45units at breakfast   Placed verbal order accordingly

## 2018-08-20 NOTE — Progress Notes (Signed)
Called nuc med, they stated pt is not on schedule for today  Paged cardiology NP regarding pt NPO status

## 2018-08-20 NOTE — Consult Note (Signed)
Cardiology Consult    Patient ID: Elijah Watkins MRN: [id number], DOB/AGE: 08/30/1966   Admit date: 08/19/2018 Date of Consult: 08/20/2018  Primary Physician: Darrow Bussing, MD Primary Cardiologist: No primary care provider on file.   Past Medical History   Past Medical History:  Diagnosis Date  . Anger    anger issues  . Anxiety   . Arthritis   . CAD (coronary artery disease)   . Dental crowns present   . Diabetes mellitus    IDDM - fasting 70-120s  . GERD (gastroesophageal reflux disease)   . History of concussion   . History of DVT (deep vein thrombosis) 03/2005  . History of peptic ulcer age 52  . Hyperlipidemia   . Hypertension    under control, has been on med. > 7 yrs.  . Myocardial infarction (HCC)    stents  . Nephrolithiasis    states has 3 stones in kidney, but no current problems  . Overweight(278.02)   . Pilonidal cyst 05/2012   area is open, not draining  . Pneumonia    hx  . PONV (postoperative nausea and vomiting)   . S/P coronary artery stent placement 2001, 05/2011  . Seasonal allergies     Past Surgical History:  Procedure Laterality Date  . CARDIAC CATHETERIZATION  10/06/2010  . CARPAL TUNNEL RELEASE Right   . CHOLECYSTECTOMY N/A 08/18/2013   Procedure: LAPAROSCOPIC CHOLECYSTECTOMY WITH INTRAOPERATIVE CHOLANGIOGRAM;  Surgeon: Robyne Askew, MD;  Location[facility] OR;  Service: General;  Laterality: N/A;  . CHONDROPLASTY  08/04/2004   right knee  . CORONARY ANGIOPLASTY WITH STENT PLACEMENT     x 2; last time 05/2011  . DISTAL BICEPS TENDON REPAIR Left 02/19/2016   Procedure: DISTAL BICEPS TENDON REPAIR;  Surgeon: Jodi Geralds, MD;  Location: [facility];  Service: Orthopedics;  Laterality: Left;  . KIDNEY STONE SURGERY  1988 - 1998   x 3  . knee arthroscopic     left knee  . KNEE CARTILAGE SURGERY  10/2003, 11/2003   Also had bursa removed  . PILONIDAL CYST DRAINAGE  05/19/2012   Procedure: IRRIGATION AND DEBRIDEMENT PILONIDAL CYST;   Surgeon: Robyne Askew, MD;  Location: [facility];  Service: General;  Laterality: N/A;  I & D Pilonidal abscess, placement of acell  . PREPATELLAR BURSA EXCISION  12/05/2004   right  . WRIST SURGERY  02/1997   right     Allergies  Allergies  Allergen Reactions  . Latex Hives, Swelling and Other (See Comments)    Dry cough    History of Present Illness    52 y o man with PMH of CAD PCI in 2012, HTN, DVT, DM, Presents to ED with new onset chest pain during treadmill exercise.  He picked up treadmill exercise 4 months ago noted to lose weight.  Has never experienced pain like that the last few years.  Pain feels different than prior to the PCI back in 2012.  Then on the way home from the gym he had another episode of chest pain while walking slowly.  Pain lasted few minutes each time.  No shortness of breath, PND lower extremity edema or syncope or presyncope.  He took nitroglycerin with some pain relief.  Currently pain-free  His diabetes is good with insulin.  His A1c last was 9.  Blood pressure is controlled at home.  He has a cardiologist at [facility].  Has never had a stress test  but I can see several cardiology scans in care everywhere.  Cannot tell what type of test those were.  He thinks he had a coronary angiogram 2 years ago that showed no new lesions.  He started on testosterone patches yesterday.   No smoking, alcohol or illicit drug use  Inpatient Medications      Family History    Family History  Problem Relation Age of Onset  . Diabetes Mother 67  . Hypertension Father   . Alcohol abuse Father 99   He indicated that his mother is alive. He indicated that his father is alive.   Social History    Social History   Socioeconomic History  . Marital status: Married    Spouse name: Not on file  . Number of children: Not on file  . Years of education: Not on file  . Highest education level: Not on file  Occupational History  . Not on file    Social Needs  . Financial resource strain: Not on file  . Food insecurity:    Worry: Not on file    Inability: Not on file  . Transportation needs:    Medical: Not on file    Non-medical: Not on file  Tobacco Use  . Smoking status: Former Smoker    Last attempt to quit: 02/24/1984    Years since quitting: 34.5  . Smokeless tobacco: Never Used  Substance and Sexual Activity  . Alcohol use: Yes    Comment: rare  . Drug use: No  . Sexual activity: Not on file  Lifestyle  . Physical activity:    Days per week: Not on file    Minutes per session: Not on file  . Stress: Not on file  Relationships  . Social connections:    Talks on phone: Not on file    Gets together: Not on file    Attends religious service: Not on file    Active member of club or organization: Not on file    Attends meetings of clubs or organizations: Not on file    Relationship status: Not on file  . Intimate partner violence:    Fear of current or ex partner: Not on file    Emotionally abused: Not on file    Physically abused: Not on file    Forced sexual activity: Not on file  Other Topics Concern  . Not on file  Social History Narrative  . Not on file     Review of Systems    General:  No chills, fever, night sweats or weight changes.  Cardiovascular:  No chest pain, dyspnea on exertion, edema, orthopnea, palpitations, paroxysmal nocturnal dyspnea. Dermatological: No rash, lesions/masses Respiratory: No cough, dyspnea Urologic: No hematuria, dysuria Abdominal:   No nausea, vomiting, diarrhea, bright red blood per rectum, melena, or hematemesis Neurologic:  No visual changes, wkns, changes in mental status. All other systems reviewed and are otherwise negative except as noted above.  Physical Exam    Blood pressure (!) 143/91, pulse 80, temperature 98.7 F (37.1 C), temperature source Oral, resp. rate 17, SpO2 97 %.  General: Pleasant, NAD Psych: Normal affect. Neuro: Alert and oriented X 3.  Moves all extremities spontaneously. HEENT: Normal  Neck: Supple without bruits or JVD. Lungs:  Resp regular and unlabored, CTA. Heart: RRR no s3, s4, or murmurs. Abdomen: Soft, non-tender, non-distended, BS + x 4.  Extremities: No clubbing, cyanosis or edema. DP/PT/Radials 2+ and equal bilaterally.  Labs    Troponin [facility]  of [facility] Test) Recent Labs    08/19/18 2107  TROPIPOC 0.05   No results for input(s): CKTOTAL, CKMB, TROPONINI in the last 72 hours. Lab Results  Component Value Date   WBC 9.0 08/19/2018   HGB 13.2 08/19/2018   HCT 39.3 08/19/2018   MCV 87.7 08/19/2018   PLT 203 08/19/2018    Recent Labs  Lab 08/19/18 1658  NA 138  K 3.7  CL 106  CO2 24  BUN 18  CREATININE 1.54*  CALCIUM 9.1  GLUCOSE 265*   Lab Results  Component Value Date   CHOL  10/04/2010    178        ATP III CLASSIFICATION:  <200     mg/dL   Desirable  454-098  mg/dL   Borderline High  >=119    mg/dL   High          HDL 37 (L) 10/04/2010   LDLCALC  10/04/2010    90        Total Cholesterol/HDL:CHD Risk Coronary Heart Disease Risk Table                     Men   Women  1/2 Average Risk   3.4   3.3  Average Risk       5.0   4.4  2 X Average Risk   9.6   7.1  3 X Average Risk  23.4   11.0        Use the calculated Patient Ratio above and the CHD Risk Table to determine the patient's CHD Risk.        ATP III CLASSIFICATION (LDL):  <100     mg/dL   Optimal  147-829  mg/dL   Near or Above                    Optimal  130-159  mg/dL   Borderline  562-130  mg/dL   High  >865     mg/dL   Very High   TRIG 784 (H) 10/04/2010   No results found for: [facility]   Radiology Studies    Dg Chest 2 View  Result Date: 08/19/2018 CLINICAL DATA:  Onset of chest pain at the gym today EXAM: CHEST - 2 VIEW COMPARISON:  08/28/2016 FINDINGS: Normal heart size, mediastinal contours, and pulmonary vascularity. Lungs clear. No pleural effusion or pneumothorax. Bones unremarkable. IMPRESSION: Normal  exam. Electronically Signed   By: Ulyses Southward M.D.   On: 08/19/2018 17:21    ECG & Cardiac Imaging    Normal sinus rhythm with nonspecific ST changes.  ST segments somewhat flatter than on an ECG 2 years ago.  Assessment & Plan    Stable angina: Pain free now. Typical angina. High pretest probability. Trop neg x2. Could make argument for outpatient eval stress vs cath. In discussion with ED they dont feel comfortable sending out. Want to admit. Plan to admit to medicine with cards follow up.  Will see in am to decide on stress vs direct cath Monday. Dont think there is need for heparin. Cont on ASA. Statin (increase to 80mg ). Cont metoprolol  Signed, Macario Golds, MD 08/20/2018, 12:17 AM  For questions or updates, please contact   Please consult [email or web address] for contact info under Cardiology/STEMI.

## 2018-08-21 DIAGNOSIS — K219 Gastro-esophageal reflux disease without esophagitis: Secondary | ICD-10-CM

## 2018-08-21 DIAGNOSIS — I252 Old myocardial infarction: Secondary | ICD-10-CM

## 2018-08-21 DIAGNOSIS — Z794 Long term (current) use of insulin: Secondary | ICD-10-CM

## 2018-08-21 DIAGNOSIS — F419 Anxiety disorder, unspecified: Secondary | ICD-10-CM

## 2018-08-21 DIAGNOSIS — E1122 Type 2 diabetes mellitus with diabetic chronic kidney disease: Secondary | ICD-10-CM

## 2018-08-21 LAB — GLUCOSE, CAPILLARY
GLUCOSE-CAPILLARY: 233 mg/dL — AB (ref 70–99)
GLUCOSE-CAPILLARY: 277 mg/dL — AB (ref 70–99)
Glucose-Capillary: 260 mg/dL — ABNORMAL HIGH (ref 70–99)
Glucose-Capillary: 217 mg/dL — ABNORMAL HIGH (ref 70–99)

## 2018-08-21 MED ORDER — INSULIN ASPART PROT & ASPART (70-30 MIX) 100 UNIT/ML ~~LOC~~ SUSP
40.0000 [IU] | Freq: Every day | SUBCUTANEOUS | Status: DC
  Administered 2018-08-21 – 2018-08-22 (×2): 40 [IU] via SUBCUTANEOUS
  Filled 2018-08-21 (×4): qty 10

## 2018-08-21 MED ORDER — INSULIN ASPART PROT & ASPART (70-30 MIX) 100 UNIT/ML ~~LOC~~ SUSP
10.0000 [IU] | Freq: Once | SUBCUTANEOUS | Status: AC
  Administered 2018-08-21: 10 [IU] via SUBCUTANEOUS
  Filled 2018-08-21: qty 10

## 2018-08-21 NOTE — Progress Notes (Addendum)
PROGRESS NOTE   Elijah Watkins  ZOX:[id number]    DOB: 12-Nov-1966    DOA: 08/19/2018  PCP: Tracey Harries, MD   I have briefly reviewed patients previous medical records in [facility].  Brief Narrative:  52 year old male with PMH of MI, CAD status post 2 stents, last in 2012, sees cardiology in [facility] (? Dr. Wynema Birch), type II DM/IDDM, GERD, HTN, HLD, DVT, morbid obesity presented to the ED on 08/20/2018 with new onset chest pain while he was on the treadmill.  Patient has been exercising at [facility] for the last 4 months, usually does the reclining bike 4 times weekly without chest pain, noted new onset of chest pain while he was doing weights which progressively got worse 10 minutes into the treadmill walk.  Some relief with sublingual NTG.  Recently started testosterone patches.  Admitted for unstable angina.  Troponins minimally elevated, flat or downward trend.  Cardiology consulted and plan coronary angiogram 9/9.  Chest pain resolved.   Assessment & Plan:   Principal Problem:   Unstable angina (HCC) Active Problems:   Type II diabetes mellitus with renal manifestations (HCC)   Coronary artery disease   Hypertension   HLD (hyperlipidemia)   GERD (gastroesophageal reflux disease)   CKD (chronic kidney disease), stage III (HCC)   Anxiety   CAD with unstable angina: History as indicated above.  Troponins x3: Minimal elevation with downward trend.  Cardiology input appreciated.  Continue aspirin, atorvastatin, metoprolol.  Chest pain has resolved without recurrence.  Plan is for coronary angiogram 9/9.  Poorly controlled DM 2 with renal complications: A1c 10.4 suggests poor outpatient control.  He was taking NovoLog Mix 70/30, 90 units twice daily at home.  Here he was started on less than half the dose.  CBGs obviously uncontrolled as noted below.  Since patient will be n.p.o. after midnight tonight for cath in the morning, will increase his insulin marginally to 55 units  this morning and 45 units at supper.  This can be further titrated post-cath.  Monitor closely, especially while he is n.p.o. after midnight for procedure.  Frequent CBGs if needed.  Diabetes coordinator consulted.    Essential hypertension: Controlled on metoprolol and lisinopril.  Hyperlipidemia: Atorvastatin dose increased during this admission.  Morbid obesity/Body mass index is 40.2 kg/m.  Needs to lose weight.  GERD: Continue Protonix.  Stage III chronic kidney disease: Presented with creatinine of 1.5 which may be his baseline.  Patient on hydration precath.  Follow BMP in a.m.  Anxiety: Continue Xanax.  History of provoked DVT: Low index of suspicion for PE this admission.   DVT prophylaxis: Lovenox. Code Status: Full. Family Communication: None at bedside. Disposition: To be determined post cardiac cath.   Consultants:  Cardiology  Procedures:  None  Antimicrobials:  None   Subjective: No recurrence of chest pain since hospital admission.  No dyspnea or any other complaints reported.  ROS: As above, otherwise negative.  Objective:  Vitals:   08/20/18 1816 08/20/18 2007 08/21/18 0616 08/21/18 1000  BP: 121/77 129/72 130/74 137/84  Pulse: 73 77 70 78  Resp: 17 18 18 16   Temp: 99.5 F (37.5 C) 98.1 F (36.7 C) 98.1 F (36.7 C) 98.1 F (36.7 C)  TempSrc: Oral Oral Oral Oral  SpO2: 97% 96% 98% 98%  Weight:   130.7 kg   Height:        Examination:  General exam: Pleasant young male, moderately built and morbidly obese, sitting up comfortably  in bed this morning. Respiratory system: Clear to auscultation. Respiratory effort normal. Cardiovascular system: S1 & S2 heard, RRR. No JVD, murmurs, rubs, gallops or clicks. No pedal edema.  Telemetry personally reviewed: Sinus rhythm. Gastrointestinal system: Abdomen is nondistended, soft and nontender. No organomegaly or masses felt. Normal bowel sounds heard. Central nervous system: Alert and oriented. No  focal neurological deficits. Extremities: Symmetric 5 x 5 power. Skin: No rashes, lesions or ulcers.  Multiple tattoos on his upper extremities. Psychiatry: Judgement and insight appear normal. Mood & affect appropriate.     Data Reviewed: I have personally reviewed following labs and imaging studies  CBC: Recent Labs  Lab 08/19/18 1658  WBC 9.0  HGB 13.2  HCT 39.3  MCV 87.7  PLT 203   Basic Metabolic Panel: Recent Labs  Lab 08/19/18 1658  NA 138  K 3.7  CL 106  CO2 24  GLUCOSE 265*  BUN 18  CREATININE 1.54*  CALCIUM 9.1   Cardiac Enzymes: Recent Labs  Lab 08/20/18 0145 08/20/18 0620 08/20/18 1344  TROPONINI 0.05* 0.04* 0.03*   HbA1C: Recent Labs    08/20/18 0620  HGBA1C 10.4*   CBG: Recent Labs  Lab 08/20/18 0751 08/20/18 1159 08/20/18 1704 08/20/18 2114 08/21/18 0815  GLUCAP 269* 261* 194* 272* 233*    No results found for this or any previous visit (from the past 240 hour(s)).       Radiology Studies: Dg Chest 2 View  Result Date: 08/19/2018 CLINICAL DATA:  Onset of chest pain at the gym today EXAM: CHEST - 2 VIEW COMPARISON:  08/28/2016 FINDINGS: Normal heart size, mediastinal contours, and pulmonary vascularity. Lungs clear. No pleural effusion or pneumothorax. Bones unremarkable. IMPRESSION: Normal exam. Electronically Signed   By: Ulyses Southward M.D.   On: 08/19/2018 17:21        Scheduled Meds: . aspirin  81 mg Oral Pre-Cath  . aspirin EC  81 mg Oral Daily  . atorvastatin  80 mg Oral QHS  . chlorhexidine  15 mL Mouth Rinse BID  . enoxaparin (LOVENOX) injection  40 mg Subcutaneous Q24H  . gabapentin  600 mg Oral TID  . insulin aspart  0-9 Units Subcutaneous TID WC  . insulin aspart protamine- aspart  30 Units Subcutaneous Q supper  . insulin aspart protamine- aspart  45 Units Subcutaneous Q breakfast  . lisinopril  5 mg Oral Daily  . mouth rinse  15 mL Mouth Rinse q12n4p  . metoprolol tartrate  50 mg Oral BID  . pantoprazole  40  mg Oral Daily  . sodium chloride flush  3 mL Intravenous Q12H  . zolpidem  10 mg Oral QHS   Continuous Infusions: . sodium chloride 75 mL/hr at 08/21/18 0519  . sodium chloride    . sodium chloride       LOS: 1 day     Marcellus Scott, MD, FACP, [facility]. [facility] Pager [phone]  If 7PM-7AM, please contact night-coverage [email or web address] Password [email or web address] 08/21/2018, 11:13 AM

## 2018-08-21 NOTE — Progress Notes (Signed)
Progress Note  Patient Name: Elijah Watkins Date of Encounter: 08/21/2018  Primary Cardiologist:   No primary care provider on file.  (Sees MD in [facility])   Subjective   No chest pain.  No SOB.   Inpatient Medications    Scheduled Meds: . aspirin  81 mg Oral Pre-Cath  . aspirin EC  81 mg Oral Daily  . atorvastatin  80 mg Oral QHS  . chlorhexidine  15 mL Mouth Rinse BID  . enoxaparin (LOVENOX) injection  40 mg Subcutaneous Q24H  . gabapentin  600 mg Oral TID  . insulin aspart  0-9 Units Subcutaneous TID WC  . insulin aspart protamine- aspart  30 Units Subcutaneous Q supper  . insulin aspart protamine- aspart  45 Units Subcutaneous Q breakfast  . lisinopril  5 mg Oral Daily  . mouth rinse  15 mL Mouth Rinse q12n4p  . metoprolol tartrate  50 mg Oral BID  . pantoprazole  40 mg Oral Daily  . sodium chloride flush  3 mL Intravenous Q12H  . zolpidem  10 mg Oral QHS   Continuous Infusions: . sodium chloride 75 mL/hr at 08/21/18 0519  . sodium chloride    . sodium chloride     PRN Meds: sodium chloride, acetaminophen, ALPRAZolam, hydrALAZINE, HYDROcodone-acetaminophen, morphine injection, nitroGLYCERIN, ondansetron (ZOFRAN) IV, sodium chloride flush   Vital Signs    Vitals:   08/20/18 0800 08/20/18 1816 08/20/18 2007 08/21/18 0616  BP:  121/77 129/72 130/74  Pulse:  73 77 70  Resp:  17 18 18   Temp:  99.5 F (37.5 C) 98.1 F (36.7 C) 98.1 F (36.7 C)  TempSrc:  Oral Oral Oral  SpO2:  97% 96% 98%  Weight:    130.7 kg  Height: 5\' 11"  (1.803 m)       Intake/Output Summary (Last 24 hours) at 08/21/2018 0759 Last data filed at 08/21/2018 0619 Gross per 24 hour  Intake 1394.95 ml  Output 600 ml  Net 794.95 ml   Filed Weights   08/20/18 0235 08/21/18 0616  Weight: 130.9 kg 130.7 kg    Telemetry    NSR, SB - Personally Reviewed  ECG    NA - Personally Reviewed  Physical Exam   GEN: No  acute distress.   Neck: No  JVD Cardiac: RRR, no murmurs, rubs,  or gallops.  Respiratory: Clear   to auscultation bilaterally. GI: Soft, nontender, non-distended, normal bowel sounds  MS:   No edema; No deformity. Neuro:   Nonfocal  Psych: Oriented and appropriate   Labs    Chemistry Recent Labs  Lab 08/19/18 1658  NA 138  K 3.7  CL 106  CO2 24  GLUCOSE 265*  BUN 18  CREATININE 1.54*  CALCIUM 9.1  GFRNONAA 50*  GFRAA 58*  ANIONGAP 8     Hematology Recent Labs  Lab 08/19/18 1658  WBC 9.0  RBC 4.48  HGB 13.2  HCT 39.3  MCV 87.7  MCH 29.5  MCHC 33.6  RDW 12.6  PLT 203    Cardiac Enzymes Recent Labs  Lab 08/20/18 0145 08/20/18 0620 08/20/18 1344  TROPONINI 0.05* 0.04* 0.03*    Recent Labs  Lab 08/19/18 1713 08/19/18 2107  TROPIPOC 0.02 0.05     BNPNo results for input(s): BNP, PROBNP in the last 168 hours.   DDimer No results for input(s): DDIMER in the last 168 hours.   Radiology    Dg Chest 2 View  Result Date: 08/19/2018 CLINICAL DATA:  Onset of chest pain at the gym today EXAM: CHEST - 2 VIEW COMPARISON:  08/28/2016 FINDINGS: Normal heart size, mediastinal contours, and pulmonary vascularity. Lungs clear. No pleural effusion or pneumothorax. Bones unremarkable. IMPRESSION: Normal exam. Electronically Signed   By: Ulyses Southward M.D.   On: 08/19/2018 17:21    Cardiac Studies   NA  Patient Profile     51 y.o. male CAD PCI in 2012, HTN, DVT, DM, Presents to ED with new onset chest pain during treadmill exercise.  Assessment & Plan    CHEST PAIN:    Cath on Monday.  The patient understands that risks included but are not limited to stroke (1 in 1000), death (1 in 1000), kidney failure [usually temporary] (1 in 500), bleeding (1 in 200), allergic reaction [possibly serious] (1 in 200).  The patient understands and agrees to proceed.     DM:  A1c without good control.  Per primary team.    DYSLIPIDEMIA:  Needs better diabetes control for treatment of triglycerides.  Lipitor dose has been increased this  admission.   For questions or updates, please contact [facility] Please consult [email or web address] for contact info under Cardiology/STEMI.   Signed, Rollene Rotunda, MD  08/21/2018, 7:59 AM

## 2018-08-22 ENCOUNTER — Other Ambulatory Visit: Payer: Self-pay

## 2018-08-22 ENCOUNTER — Encounter (HOSPITAL_COMMUNITY): Payer: Self-pay | Admitting: Interventional Cardiology

## 2018-08-22 ENCOUNTER — Encounter (HOSPITAL_COMMUNITY)
Admission: EM | Disposition: A | Discharge status: Home / Self Care | Payer: Self-pay | Source: Home / Self Care | Attending: Internal Medicine

## 2018-08-22 DIAGNOSIS — N183 Chronic kidney disease, stage 3 (moderate): Secondary | ICD-10-CM

## 2018-08-22 DIAGNOSIS — I10 Essential (primary) hypertension: Secondary | ICD-10-CM

## 2018-08-22 DIAGNOSIS — I25110 Atherosclerotic heart disease of native coronary artery with unstable angina pectoris: Principal | ICD-10-CM

## 2018-08-22 DIAGNOSIS — E785 Hyperlipidemia, unspecified: Secondary | ICD-10-CM

## 2018-08-22 HISTORY — PX: LEFT HEART CATH AND CORONARY ANGIOGRAPHY: CATH118249

## 2018-08-22 HISTORY — PX: CORONARY STENT INTERVENTION: CATH118234

## 2018-08-22 LAB — CBC
HCT: 38.5 % — ABNORMAL LOW (ref 39.0–52.0)
Hemoglobin: 12.7 g/dL — ABNORMAL LOW (ref 13.0–17.0)
MCH: 29.1 pg (ref 26.0–34.0)
MCHC: 33 g/dL (ref 30.0–36.0)
MCV: 88.1 fL (ref 78.0–100.0)
PLATELETS: 180 10*3/uL (ref 150–400)
RBC: 4.37 MIL/uL (ref 4.22–5.81)
RDW: 12.4 % (ref 11.5–15.5)
WBC: 7.7 10*3/uL (ref 4.0–10.5)

## 2018-08-22 LAB — BASIC METABOLIC PANEL
Anion gap: 9 (ref 5–15)
BUN: 13 mg/dL (ref 6–20)
CALCIUM: 8.7 mg/dL — AB (ref 8.9–10.3)
CO2: 24 mmol/L (ref 22–32)
CREATININE: 1.36 mg/dL — AB (ref 0.61–1.24)
Chloride: 105 mmol/L (ref 98–111)
GFR calc Af Amer: >60 mL/min (ref >60)
GFR calc non Af Amer: 58 mL/min — ABNORMAL LOW (ref >60)
GLUCOSE: 252 mg/dL — AB (ref 70–99)
Potassium: 3.8 mmol/L (ref 3.5–5.1)
Sodium: 138 mmol/L (ref 135–145)

## 2018-08-22 LAB — GLUCOSE, CAPILLARY
GLUCOSE-CAPILLARY: 243 mg/dL — AB (ref 70–99)
Glucose-Capillary: 217 mg/dL — ABNORMAL HIGH (ref 70–99)
Glucose-Capillary: 243 mg/dL — ABNORMAL HIGH (ref 70–99)

## 2018-08-22 LAB — POCT ACTIVATED CLOTTING TIME: Activated Clotting Time: 368 seconds

## 2018-08-22 SURGERY — LEFT HEART CATH AND CORONARY ANGIOGRAPHY
Anesthesia: LOCAL

## 2018-08-22 MED ORDER — ANGIOPLASTY BOOK
Freq: Once | Status: AC
  Administered 2018-08-22: 1
  Filled 2018-08-22: qty 1

## 2018-08-22 MED ORDER — HYDRALAZINE HCL 20 MG/ML IJ SOLN: 5.0000 mg | INTRAMUSCULAR | Status: AC | PRN

## 2018-08-22 MED ORDER — FENTANYL CITRATE (PF) 100 MCG/2ML IJ SOLN
INTRAMUSCULAR | Status: DC | PRN
  Administered 2018-08-22 (×3): 25 ug via INTRAVENOUS

## 2018-08-22 MED ORDER — TIROFIBAN (AGGRASTAT) BOLUS VIA INFUSION
INTRAVENOUS | Status: DC | PRN
  Administered 2018-08-22: 3277.5 ug via INTRAVENOUS

## 2018-08-22 MED ORDER — FENTANYL CITRATE (PF) 100 MCG/2ML IJ SOLN
INTRAMUSCULAR | Status: AC
  Filled 2018-08-22: qty 2

## 2018-08-22 MED ORDER — HEPARIN (PORCINE) IN NACL 1000-0.9 UT/500ML-% IV SOLN
INTRAVENOUS | Status: AC
  Filled 2018-08-22: qty 1000

## 2018-08-22 MED ORDER — ASPIRIN 81 MG PO CHEW
81.0000 mg | CHEWABLE_TABLET | Freq: Every day | ORAL | Status: DC
  Administered 2018-08-23: 10:00:00 81 mg via ORAL
  Filled 2018-08-22: qty 1

## 2018-08-22 MED ORDER — TICAGRELOR 90 MG PO TABS
ORAL_TABLET | ORAL | Status: AC
  Filled 2018-08-22: qty 2

## 2018-08-22 MED ORDER — SODIUM CHLORIDE 0.9 % IV SOLN: INTRAVENOUS | Status: AC

## 2018-08-22 MED ORDER — LIDOCAINE HCL (PF) 1 % IJ SOLN
INTRAMUSCULAR | Status: DC | PRN
  Administered 2018-08-22: 3 mL

## 2018-08-22 MED ORDER — MIDAZOLAM HCL 2 MG/2ML IJ SOLN
INTRAMUSCULAR | Status: DC | PRN
  Administered 2018-08-22: 1 mg via INTRAVENOUS
  Administered 2018-08-22: 2 mg via INTRAVENOUS
  Administered 2018-08-22: 1 mg via INTRAVENOUS

## 2018-08-22 MED ORDER — ACETAMINOPHEN 325 MG PO TABS: 650.0000 mg | ORAL_TABLET | ORAL | Status: DC | PRN

## 2018-08-22 MED ORDER — HEPARIN (PORCINE) IN NACL 1000-0.9 UT/500ML-% IV SOLN
INTRAVENOUS | Status: DC | PRN
  Administered 2018-08-22 (×2): 500 mL

## 2018-08-22 MED ORDER — SODIUM CHLORIDE 0.9% FLUSH: 3.0000 mL | Freq: Two times a day (BID) | INTRAVENOUS | Status: DC

## 2018-08-22 MED ORDER — SODIUM CHLORIDE 0.9 % IV SOLN: 250.0000 mL | INTRAVENOUS | Status: DC | PRN

## 2018-08-22 MED ORDER — TICAGRELOR 90 MG PO TABS
90.0000 mg | ORAL_TABLET | Freq: Two times a day (BID) | ORAL | Status: DC
  Administered 2018-08-23 (×2): 90 mg via ORAL
  Filled 2018-08-22 (×2): qty 1

## 2018-08-22 MED ORDER — HEPARIN SODIUM (PORCINE) 1000 UNIT/ML IJ SOLN
INTRAMUSCULAR | Status: AC
  Filled 2018-08-22: qty 1

## 2018-08-22 MED ORDER — NITROGLYCERIN 1 MG/10 ML FOR IR/CATH LAB
INTRA_ARTERIAL | Status: DC | PRN
  Administered 2018-08-22: 200 ug via INTRACORONARY

## 2018-08-22 MED ORDER — TIROFIBAN HCL IV 12.5 MG/250 ML
INTRAVENOUS | Status: DC | PRN
  Administered 2018-08-22: 0.15 ug/kg/min via INTRAVENOUS

## 2018-08-22 MED ORDER — MIDAZOLAM HCL 2 MG/2ML IJ SOLN
INTRAMUSCULAR | Status: AC
  Filled 2018-08-22: qty 2

## 2018-08-22 MED ORDER — LABETALOL HCL 5 MG/ML IV SOLN: 10.0000 mg | INTRAVENOUS | Status: AC | PRN

## 2018-08-22 MED ORDER — SODIUM CHLORIDE 0.9% FLUSH: 3.0000 mL | INTRAVENOUS | Status: DC | PRN

## 2018-08-22 MED ORDER — VERAPAMIL HCL 2.5 MG/ML IV SOLN
INTRAVENOUS | Status: DC | PRN
  Administered 2018-08-22: 10 mL via INTRA_ARTERIAL

## 2018-08-22 MED ORDER — ONDANSETRON HCL 4 MG/2ML IJ SOLN: 4.0000 mg | Freq: Four times a day (QID) | INTRAMUSCULAR | Status: DC | PRN

## 2018-08-22 MED ORDER — NITROGLYCERIN 1 MG/10 ML FOR IR/CATH LAB
INTRA_ARTERIAL | Status: AC
  Filled 2018-08-22: qty 10

## 2018-08-22 MED ORDER — IOHEXOL 350 MG/ML SOLN
INTRAVENOUS | Status: DC | PRN
  Administered 2018-08-22: 100 mL via INTRA_ARTERIAL

## 2018-08-22 MED ORDER — TIROFIBAN HCL IV 12.5 MG/250 ML
INTRAVENOUS | Status: AC
  Filled 2018-08-22: qty 250

## 2018-08-22 MED ORDER — LIDOCAINE HCL (PF) 1 % IJ SOLN
INTRAMUSCULAR | Status: AC
  Filled 2018-08-22: qty 30

## 2018-08-22 MED ORDER — TICAGRELOR 90 MG PO TABS
ORAL_TABLET | ORAL | Status: DC | PRN
  Administered 2018-08-22: 180 mg via ORAL

## 2018-08-22 MED ORDER — HEPARIN SODIUM (PORCINE) 1000 UNIT/ML IJ SOLN
INTRAMUSCULAR | Status: DC | PRN
  Administered 2018-08-22: 8000 [IU] via INTRAVENOUS
  Administered 2018-08-22: 6000 [IU] via INTRAVENOUS

## 2018-08-22 MED ORDER — VERAPAMIL HCL 2.5 MG/ML IV SOLN
INTRAVENOUS | Status: AC
  Filled 2018-08-22: qty 2

## 2018-08-22 SURGICAL SUPPLY — 18 items
BALLN SAPPHIRE 2.5X12 (BALLOONS) ×2
BALLN SAPPHIRE ~~LOC~~ 3.5X12 (BALLOONS) ×1 IMPLANT
BALLOON SAPPHIRE 2.5X12 (BALLOONS) IMPLANT
CATH 5FR JL3.5 JR4 ANG PIG MP (CATHETERS) ×1 IMPLANT
CATH LAUNCHER 6FR JR4 (CATHETERS) ×1 IMPLANT
DEVICE RAD COMP TR BAND LRG (VASCULAR PRODUCTS) ×1 IMPLANT
GLIDESHEATH SLEND SS 6F .021 (SHEATH) ×1 IMPLANT
GUIDEWIRE INQWIRE 1.5J.035X260 (WIRE) IMPLANT
INQWIRE 1.5J .035X260CM (WIRE) ×2
KIT ENCORE 26 ADVANTAGE (KITS) ×1 IMPLANT
KIT HEART LEFT (KITS) ×2 IMPLANT
KIT HEMO VALVE WATCHDOG (MISCELLANEOUS) ×1 IMPLANT
PACK CARDIAC CATHETERIZATION (CUSTOM PROCEDURE TRAY) ×2 IMPLANT
SHEATH PROBE COVER 6X72 (BAG) ×1 IMPLANT
STENT SYNERGY DES 3X20 (Permanent Stent) ×1 IMPLANT
TRANSDUCER W/STOPCOCK (MISCELLANEOUS) ×2 IMPLANT
TUBING CIL FLEX 10 FLL-RA (TUBING) ×2 IMPLANT
WIRE ASAHI PROWATER 180CM (WIRE) ×1 IMPLANT

## 2018-08-22 NOTE — H&P (View-Only) (Signed)
 Progress Note  Patient Name: Elijah Watkins Date of Encounter: 08/22/2018  Primary Cardiologist:   No primary care provider on file.  (Sees MD in [place])   Subjective   No more chest pain, NPO for a cath.  Inpatient Medications    Scheduled Meds: . aspirin EC  81 mg Oral Daily  . atorvastatin  80 mg Oral QHS  . chlorhexidine  15 mL Mouth Rinse BID  . enoxaparin (LOVENOX) injection  40 mg Subcutaneous Q24H  . gabapentin  600 mg Oral TID  . insulin aspart  0-9 Units Subcutaneous TID WC  . insulin aspart protamine- aspart  40 Units Subcutaneous Q supper  . insulin aspart protamine- aspart  45 Units Subcutaneous Q breakfast  . lisinopril  5 mg Oral Daily  . mouth rinse  15 mL Mouth Rinse q12n4p  . metoprolol tartrate  50 mg Oral BID  . pantoprazole  40 mg Oral Daily  . sodium chloride flush  3 mL Intravenous Q12H  . zolpidem  10 mg Oral QHS   Continuous Infusions: . sodium chloride Stopped (08/22/18 0544)  . sodium chloride    . sodium chloride 1 mL/kg/hr (08/22/18 0544)   PRN Meds: sodium chloride, acetaminophen, ALPRAZolam, hydrALAZINE, HYDROcodone-acetaminophen, morphine injection, nitroGLYCERIN, ondansetron (ZOFRAN) IV, sodium chloride flush   Vital Signs    Vitals:   08/21/18 1225 08/21/18 1940 08/22/18 0557 08/22/18 0921  BP: 128/84 134/75 134/82 (!) 148/98  Pulse: 69 78 71 75  Resp: 18 18 18   Temp: 98.3 F (36.8 C) 98.7 F (37.1 C) 98.1 F (36.7 C)   TempSrc: Oral Oral Oral   SpO2: 98% 97% 100%   Weight:   131.1 kg   Height:        Intake/Output Summary (Last 24 hours) at 08/22/2018 1010 Last data filed at 08/22/2018 0600 Gross per 24 hour  Intake 3641.82 ml  Output -  Net 3641.82 ml   Filed Weights   08/20/18 0235 08/21/18 0616 08/22/18 0557  Weight: 130.9 kg 130.7 kg 131.1 kg    Telemetry    NSR, SB - Personally Reviewed  ECG    NA - Personally Reviewed  Physical Exam   GEN: No  acute distress.   Neck: No  JVD Cardiac: RRR, no  murmurs, rubs, or gallops.  Respiratory: Clear   to auscultation bilaterally. GI: Soft, nontender, non-distended, normal bowel sounds  MS:   No edema; No deformity. Neuro:   Nonfocal  Psych: Oriented and appropriate   Labs    Chemistry Recent Labs  Lab 08/19/18 1658 08/22/18 0613  NA 138 138  K 3.7 3.8  CL 106 105  CO2 24 24  GLUCOSE 265* 252*  BUN 18 13  CREATININE 1.54* 1.36*  CALCIUM 9.1 8.7*  GFRNONAA 50* 58*  GFRAA 58* >60  ANIONGAP 8 9     Hematology Recent Labs  Lab 08/19/18 1658 08/22/18 0613  WBC 9.0 7.7  RBC 4.48 4.37  HGB 13.2 12.7*  HCT 39.3 38.5*  MCV 87.7 88.1  MCH 29.5 29.1  MCHC 33.6 33.0  RDW 12.6 12.4  PLT 203 180    Cardiac Enzymes Recent Labs  Lab 08/20/18 0145 08/20/18 0620 08/20/18 1344  TROPONINI 0.05* 0.04* 0.03*    Recent Labs  Lab 08/19/18 1713 08/19/18 2107  TROPIPOC 0.02 0.05     BNPNo results for input(s): BNP, PROBNP in the last 168 hours.   DDimer No results for input(s): DDIMER in the last 168   hours.   Radiology    No results found.  Cardiac Studies   NA  Patient Profile     52 y.o. male CAD PCI in 2012, HTN, DVT, DM, Presents to ED with new onset chest pain during treadmill exercise.  Assessment & Plan    CHEST PAIN:    Cath today.  The patient understands that risks included but are not limited to stroke (1 in 1000), death (1 in 1000), kidney failure [usually temporary] (1 in 500), bleeding (1 in 200), allergic reaction [possibly serious] (1 in 200).  The patient understands and agrees to proceed.    Crea 1.36, he is on pre cath hydration protocol, we will follow post cath.  DM:  A1c without good control.  Per primary team.    HYPERTENSION: elevated this am, we will follow.  DYSLIPIDEMIA:  Needs better diabetes control for treatment of triglycerides.  Lipitor dose has been increased this admission.   For questions or updates, please contact [facility] Please consult [email or web address] for contact  info under Cardiology/STEMI.   Signed, Maciej Schweitzer, MD  08/22/2018, 10:10 AM    

## 2018-08-22 NOTE — Interval H&P Note (Signed)
Cath Lab Visit (complete for each Cath Lab visit)  Clinical Evaluation Leading to the Procedure:   ACS: Yes.    Non-ACS:    Anginal Classification: CCS IV  Anti-ischemic medical therapy: Minimal Therapy (1 class of medications)  Non-Invasive Test Results: No non-invasive testing performed  Prior CABG: No  Previous CABG      History and Physical Interval Note:  08/22/2018 3:17 PM  Elijah Watkins  has presented today for surgery, with the diagnosis of unstable angina  The various methods of treatment have been discussed with the patient and family. After consideration of risks, benefits and other options for treatment, the patient has consented to  Procedure(s): LEFT HEART CATH AND CORONARY ANGIOGRAPHY (N/A) as a surgical intervention .  The patient's history has been reviewed, patient examined, no change in status, stable for surgery.  I have reviewed the patient's chart and labs.  Questions were answered to the patient's satisfaction.     Lance Muss

## 2018-08-22 NOTE — Progress Notes (Signed)
TR BAND REMOVAL  LOCATION:    right radial  DEFLATED PER PROTOCOL:    Yes.    TIME BAND OFF / DRESSING APPLIED:    20:00   SITE UPON ARRIVAL:    Level 0  SITE AFTER BAND REMOVAL:    Level 0  CIRCULATION SENSATION AND MOVEMENT:    Within Normal Limits   Yes.    COMMENTS:   Post TR band instructions given. Pt tolerated well. 

## 2018-08-22 NOTE — Progress Notes (Signed)
Progress Note  Patient Name: Elijah Watkins Date of Encounter: 08/22/2018  Primary Cardiologist:   No primary care provider on file.  (Sees MD in [facility])   Subjective   No more chest pain, NPO for a cath.  Inpatient Medications    Scheduled Meds: . aspirin EC  81 mg Oral Daily  . atorvastatin  80 mg Oral QHS  . chlorhexidine  15 mL Mouth Rinse BID  . enoxaparin (LOVENOX) injection  40 mg Subcutaneous Q24H  . gabapentin  600 mg Oral TID  . insulin aspart  0-9 Units Subcutaneous TID WC  . insulin aspart protamine- aspart  40 Units Subcutaneous Q supper  . insulin aspart protamine- aspart  45 Units Subcutaneous Q breakfast  . lisinopril  5 mg Oral Daily  . mouth rinse  15 mL Mouth Rinse q12n4p  . metoprolol tartrate  50 mg Oral BID  . pantoprazole  40 mg Oral Daily  . sodium chloride flush  3 mL Intravenous Q12H  . zolpidem  10 mg Oral QHS   Continuous Infusions: . sodium chloride Stopped (08/22/18 0544)  . sodium chloride    . sodium chloride 1 mL/kg/hr (08/22/18 0544)   PRN Meds: sodium chloride, acetaminophen, ALPRAZolam, hydrALAZINE, HYDROcodone-acetaminophen, morphine injection, nitroGLYCERIN, ondansetron (ZOFRAN) IV, sodium chloride flush   Vital Signs    Vitals:   08/21/18 1225 08/21/18 1940 08/22/18 0557 08/22/18 0921  BP: 128/84 134/75 134/82 (!) 148/98  Pulse: 69 78 71 75  Resp: 18 18 18    Temp: 98.3 F (36.8 C) 98.7 F (37.1 C) 98.1 F (36.7 C)   TempSrc: Oral Oral Oral   SpO2: 98% 97% 100%   Weight:   131.1 kg   Height:        Intake/Output Summary (Last 24 hours) at 08/22/2018 1010 Last data filed at 08/22/2018 0600 Gross per 24 hour  Intake 3641.82 ml  Output -  Net 3641.82 ml   Filed Weights   08/20/18 0235 08/21/18 0616 08/22/18 0557  Weight: 130.9 kg 130.7 kg 131.1 kg    Telemetry    NSR, SB - Personally Reviewed  ECG    NA - Personally Reviewed  Physical Exam   GEN: No  acute distress.   Neck: No  JVD Cardiac: RRR, no  murmurs, rubs, or gallops.  Respiratory: Clear   to auscultation bilaterally. GI: Soft, nontender, non-distended, normal bowel sounds  MS:   No edema; No deformity. Neuro:   Nonfocal  Psych: Oriented and appropriate   Labs    Chemistry Recent Labs  Lab 08/19/18 1658 08/22/18 0613  NA 138 138  K 3.7 3.8  CL 106 105  CO2 24 24  GLUCOSE 265* 252*  BUN 18 13  CREATININE 1.54* 1.36*  CALCIUM 9.1 8.7*  GFRNONAA 50* 58*  GFRAA 58* >60  ANIONGAP 8 9     Hematology Recent Labs  Lab 08/19/18 1658 08/22/18 0613  WBC 9.0 7.7  RBC 4.48 4.37  HGB 13.2 12.7*  HCT 39.3 38.5*  MCV 87.7 88.1  MCH 29.5 29.1  MCHC 33.6 33.0  RDW 12.6 12.4  PLT 203 180    Cardiac Enzymes Recent Labs  Lab 08/20/18 0145 08/20/18 0620 08/20/18 1344  TROPONINI 0.05* 0.04* 0.03*    Recent Labs  Lab 08/19/18 1713 08/19/18 2107  TROPIPOC 0.02 0.05     BNPNo results for input(s): BNP, PROBNP in the last 168 hours.   DDimer No results for input(s): DDIMER in the last 168  hours.   Radiology    No results found.  Cardiac Studies   NA  Patient Profile     52 y.o. male CAD PCI in 2012, HTN, DVT, DM, Presents to ED with new onset chest pain during treadmill exercise.  Assessment & Plan    CHEST PAIN:    Cath today.  The patient understands that risks included but are not limited to stroke (1 in 1000), death (1 in 1000), kidney failure [usually temporary] (1 in 500), bleeding (1 in 200), allergic reaction [possibly serious] (1 in 200).  The patient understands and agrees to proceed.    Crea 1.36, he is on pre cath hydration protocol, we will follow post cath.  DM:  A1c without good control.  Per primary team.    HYPERTENSION: elevated this am, we will follow.  DYSLIPIDEMIA:  Needs better diabetes control for treatment of triglycerides.  Lipitor dose has been increased this admission.   For questions or updates, please contact [facility] Please consult [email or web address] for contact  info under Cardiology/STEMI.   Signed, Tobias Alexander, MD  08/22/2018, 10:10 AM

## 2018-08-22 NOTE — Progress Notes (Signed)
PROGRESS NOTE   Elijah Watkins  WCH:[id number]    DOB: [birth year]    DOA: 08/19/2018  PCP: Tracey Harries, MD   I have briefly reviewed patients previous medical records in [facility].  Brief Narrative:  52 year old male with PMH of MI, CAD status post 2 stents, last in 2012, sees cardiology in [facility] (? Dr. Wynema Birch), type II DM/IDDM, GERD, HTN, HLD, DVT, morbid obesity presented to the ED on 08/20/2018 with new onset chest pain while he was on the treadmill.  Patient has been exercising at [facility] for the last 4 months, usually does the reclining bike 4 times weekly without chest pain, noted new onset of chest pain while he was doing weights which progressively got worse 10 minutes into the treadmill walk.  Some relief with sublingual NTG.  Recently started testosterone patches.  Admitted for unstable angina.  Troponins minimally elevated, flat or downward trend.  Cardiology consulted and plan coronary angiogram 9/9.  Chest pain resolved.   Assessment & Plan:   Principal Problem:   Unstable angina (HCC) Active Problems:   Type II diabetes mellitus with renal manifestations (HCC)   Coronary artery disease   Hypertension   HLD (hyperlipidemia)   GERD (gastroesophageal reflux disease)   CKD (chronic kidney disease), stage III (HCC)   Anxiety   CAD with unstable angina: History as indicated above.  Troponins x3: Minimal elevation with downward trend.  Cardiology input appreciated.  Continue aspirin, atorvastatin, metoprolol.  Chest pain has resolved without recurrence.  I saw the patient this morning prior to coronary angiogram.  Await cardiology follow-up.  Poorly controlled DM 2 with renal complications: A1c 10.4 suggests poor outpatient control.  He was taking NovoLog Mix 70/30, 90 units twice daily at home.  Here he was started on less than half the dose.  Increased his insulin marginally (since he was going to be n.p.o. for cath) on 9/8 to 55 units this morning and 45 units  at supper.  This can be further titrated post-cath.  Monitor closely, especially while he is n.p.o., discussed with RN.  Frequent CBGs if needed.  Diabetes coordinator consulted.    Essential hypertension: Mildly uncontrolled.  Continue metoprolol and lisinopril.  Hyperlipidemia: Atorvastatin dose increased during this admission.  Morbid obesity/Body mass index is 40.31 kg/m.  Needs to lose weight.  GERD: Continue Protonix.  Stage III chronic kidney disease: Presented with creatinine of 1.5 which may be his baseline.  Patient hydrated precath.  Creatinine down to 1.36 today.  Follow creatinine in a.m.  Anxiety: Continue Xanax.  History of provoked DVT: Low index of suspicion for PE this admission.   DVT prophylaxis: Lovenox. Code Status: Full. Family Communication: None at bedside. Disposition: To be determined post cardiac cath.   Consultants:  Cardiology  Procedures:  None  Antimicrobials:  None   Subjective: Patient was seen early this morning prior to procedure.  No further recurrence of chest pain since admission.  Discussed with RN at bedside.  No acute issues reported.  ROS: As above, otherwise negative.  Objective:  Vitals:   08/22/18 0557 08/22/18 0921 08/22/18 1233 08/22/18 1525  BP: 134/82 (!) 148/98 115/88   Pulse: 71 75 65   Resp: 18  16   Temp: 98.1 F (36.7 C)  98.1 F (36.7 C)   TempSrc: Oral  Oral   SpO2: 100%  97% 97%  Weight: 131.1 kg     Height:        Examination: No significant  change in exam compared to yesterday.  General exam: Pleasant young male, moderately built and morbidly obese, sitting up comfortably in bed this morning. Respiratory system: Clear to auscultation. Respiratory effort normal. Cardiovascular system: S1 & S2 heard, RRR. No JVD, murmurs, rubs, gallops or clicks. No pedal edema.  Telemetry personally reviewed: Sinus rhythm. Gastrointestinal system: Abdomen is nondistended, soft and nontender. No organomegaly or  masses felt. Normal bowel sounds heard. Central nervous system: Alert and oriented. No focal neurological deficits. Extremities: Symmetric 5 x 5 power. Skin: No rashes, lesions or ulcers.  Multiple tattoos on his upper extremities. Psychiatry: Judgement and insight appear normal. Mood & affect appropriate.     Data Reviewed: I have personally reviewed following labs and imaging studies  CBC: Recent Labs  Lab 08/19/18 1658 08/22/18 0613  WBC 9.0 7.7  HGB 13.2 12.7*  HCT 39.3 38.5*  MCV 87.7 88.1  PLT 203 180   Basic Metabolic Panel: Recent Labs  Lab 08/19/18 1658 08/22/18 0613  NA 138 138  K 3.7 3.8  CL 106 105  CO2 24 24  GLUCOSE 265* 252*  BUN 18 13  CREATININE 1.54* 1.36*  CALCIUM 9.1 8.7*   Cardiac Enzymes: Recent Labs  Lab 08/20/18 0145 08/20/18 0620 08/20/18 1344  TROPONINI 0.05* 0.04* 0.03*   HbA1C: Recent Labs    08/20/18 0620  HGBA1C 10.4*   CBG: Recent Labs  Lab 08/21/18 1222 08/21/18 1702 08/21/18 2115 08/22/18 0757 08/22/18 1231  GLUCAP 277* 260* 217* 243* 217*    No results found for this or any previous visit (from the past 240 hour(s)).       Radiology Studies: No results found.      Scheduled Meds: . [MAR Hold] aspirin EC  81 mg Oral Daily  . [MAR Hold] atorvastatin  80 mg Oral QHS  . [MAR Hold] chlorhexidine  15 mL Mouth Rinse BID  . [MAR Hold] enoxaparin (LOVENOX) injection  40 mg Subcutaneous Q24H  . [MAR Hold] gabapentin  600 mg Oral TID  . [MAR Hold] insulin aspart  0-9 Units Subcutaneous TID WC  . [MAR Hold] insulin aspart protamine- aspart  40 Units Subcutaneous Q supper  . [MAR Hold] insulin aspart protamine- aspart  45 Units Subcutaneous Q breakfast  . [MAR Hold] lisinopril  5 mg Oral Daily  . [MAR Hold] mouth rinse  15 mL Mouth Rinse q12n4p  . [MAR Hold] metoprolol tartrate  50 mg Oral BID  . [MAR Hold] pantoprazole  40 mg Oral Daily  . sodium chloride flush  3 mL Intravenous Q12H  . [MAR Hold] zolpidem   10 mg Oral QHS   Continuous Infusions: . sodium chloride Stopped (08/22/18 0544)  . sodium chloride    . sodium chloride 1 mL/kg/hr (08/22/18 1258)  . tirofiban Stopped (08/22/18 1621)     LOS: 2 days     Marcellus Scott, MD, FACP, [facility]. [facility] Pager [phone]  If 7PM-7AM, please contact night-coverage [email or web address] Password [email or web address] 08/22/2018, 4:28 PM

## 2018-08-23 DIAGNOSIS — Z955 Presence of coronary angioplasty implant and graft: Secondary | ICD-10-CM

## 2018-08-23 LAB — CBC
HCT: 38.6 % — ABNORMAL LOW (ref 39.0–52.0)
Hemoglobin: 12.9 g/dL — ABNORMAL LOW (ref 13.0–17.0)
MCH: 29.3 pg (ref 26.0–34.0)
MCHC: 33.4 g/dL (ref 30.0–36.0)
MCV: 87.5 fL (ref 78.0–100.0)
PLATELETS: 200 10*3/uL (ref 150–400)
RBC: 4.41 MIL/uL (ref 4.22–5.81)
RDW: 12.8 % (ref 11.5–15.5)
WBC: 8.7 10*3/uL (ref 4.0–10.5)

## 2018-08-23 LAB — BASIC METABOLIC PANEL
Anion gap: 12 (ref 5–15)
BUN: 13 mg/dL (ref 6–20)
CALCIUM: 8.7 mg/dL — AB (ref 8.9–10.3)
CO2: 22 mmol/L (ref 22–32)
CREATININE: 1.37 mg/dL — AB (ref 0.61–1.24)
Chloride: 103 mmol/L (ref 98–111)
GFR calc non Af Amer: 58 mL/min — ABNORMAL LOW (ref >60)
Glucose, Bld: 203 mg/dL — ABNORMAL HIGH (ref 70–99)
Potassium: 3.4 mmol/L — ABNORMAL LOW (ref 3.5–5.1)
Sodium: 137 mmol/L (ref 135–145)

## 2018-08-23 LAB — GLUCOSE, CAPILLARY
GLUCOSE-CAPILLARY: 169 mg/dL — AB (ref 70–99)
GLUCOSE-CAPILLARY: 214 mg/dL — AB (ref 70–99)
GLUCOSE-CAPILLARY: 156 mg/dL — AB (ref 70–99)

## 2018-08-23 MED ORDER — INSULIN ASPART PROT & ASPART (70-30 MIX) 100 UNIT/ML ~~LOC~~ SUSP
10.0000 [IU] | Freq: Once | SUBCUTANEOUS | Status: AC
  Administered 2018-08-23: 09:00:00 10 [IU] via SUBCUTANEOUS
  Filled 2018-08-23: qty 10

## 2018-08-23 MED ORDER — POTASSIUM CHLORIDE CRYS ER 20 MEQ PO TBCR
40.0000 meq | EXTENDED_RELEASE_TABLET | Freq: Once | ORAL | Status: AC
  Administered 2018-08-23: 40 meq via ORAL
  Filled 2018-08-23: qty 2

## 2018-08-23 MED ORDER — ASPIRIN 81 MG PO CHEW: 81.0000 mg | CHEWABLE_TABLET | Freq: Every day | ORAL | 0 refills | Status: DC

## 2018-08-23 MED ORDER — TICAGRELOR 90 MG PO TABS: 90.0000 mg | ORAL_TABLET | Freq: Two times a day (BID) | ORAL | 0 refills | Status: DC

## 2018-08-23 MED ORDER — ATORVASTATIN CALCIUM 40 MG PO TABS: 80.0000 mg | ORAL_TABLET | Freq: Every day | ORAL | 0 refills | Status: DC

## 2018-08-23 MED FILL — BRILINTA 90 MG TABLET: 90 | 30 days supply | Qty: 60 | Fill #0

## 2018-08-23 NOTE — Care Management Note (Signed)
Case Management Note  Patient Details  Name: RAYSHAUD RUDNIK MRN: [id number] Date of Birth: 1966-11-17  Subjective/Objective:   From home with spouse, s/p stent intervention, will be on brilinta, NCM spoke with patient about co pay and 30 day and 5 each month coupon card.  NCM informed patient he can call the phone number on back of card for any questions also and Cardiologist has samples in office of brilinta.  Patient states a pharmacist will be filling brilinta for him thru transitional care pharmacy.                Action/Plan: DC when ready.   Expected Discharge Date:                  Expected Discharge Plan:  Home/Self Care  In-House Referral:     Discharge planning Services  CM Consult, Medication Assistance  Post Acute Care Choice:    Choice offered to:     DME Arranged:    DME Agency:     HH Arranged:    HH Agency:     Status of Service:  Completed, signed off  If discussed at [employer] of Stay Meetings, dates discussed:    Additional Comments:  Leone Haven, RN 08/23/2018, 12:23 PM

## 2018-08-23 NOTE — Discharge Instructions (Addendum)
Call [facility] at [phone] if any bleeding, swelling or drainage at cath site.  May shower, no tub baths for 48 hours for groin sticks. No lifting over 5 pounds for 3 days.  No Driving for 3 days.    If you do not hear back from your [facility] cardiologist call us at above number,  Dr. Antoine Poche was cardiologist here, and ask for appt with him or PA or NP in our office.  I know [place] is working you in.  Heart Healthy diet   Do not stop asprin or brilinta, these are to keep your stent open.  Stopping could cause a heart attack.       Additional discharge instructions:  Please get your medications reviewed and adjusted by your Primary MD.  Please request your Primary MD to go over all Hospital Tests and Procedure/Radiological results at the follow up, please get all Hospital records sent to your Prim MD by signing hospital release before you go home.  If you had Pneumonia of Lung problems at the Hospital: Please get a 2 view Chest X ray done in 6-8 weeks after hospital discharge or sooner if instructed by your Primary MD.  If you have Congestive Heart Failure: Please call your Cardiologist or Primary MD anytime you have any of the following symptoms:  1) 3 pound weight gain in 24 hours or 5 pounds in 1 week  2) shortness of breath, with or without a dry hacking cough  3) swelling in the hands, feet or stomach  4) if you have to sleep on extra pillows at night in order to breathe  Follow cardiac low salt diet and 1.5 lit/day fluid restriction.  If you have diabetes Accuchecks 4 times/day, Once in AM empty stomach and then before each meal. Log in all results and show them to your primary doctor at your next visit. If any glucose reading is under 80 or above 300 call your primary MD immediately.  If you have Seizure/Convulsions/Epilepsy: Please do not drive, operate heavy machinery, participate in activities at heights or participate in high speed sports  until you have seen by Primary MD or a Neurologist and advised to do so again.  If you had Gastrointestinal Bleeding: Please ask your Primary MD to check a complete blood count within one week of discharge or at your next visit. Your endoscopic/colonoscopic biopsies that are pending at the time of discharge, will also need to followed by your Primary MD.  Get Medicines reviewed and adjusted. Please take all your medications with you for your next visit with your Primary MD  Please request your Primary MD to go over all hospital tests and procedure/radiological results at the follow up, please ask your Primary MD to get all Hospital records sent to his/her office.  If you experience worsening of your admission symptoms, develop shortness of breath, life threatening emergency, suicidal or homicidal thoughts you must seek medical attention immediately by calling 911 or calling your MD immediately  if symptoms less severe.  You must read complete instructions/literature along with all the possible adverse reactions/side effects for all the Medicines you take and that have been prescribed to you. Take any new Medicines after you have completely understood and accpet all the possible adverse reactions/side effects.   Do not drive or operate heavy machinery when taking Pain medications.   Do not take more than prescribed Pain, Sleep and Anxiety Medications  Special Instructions: If you have smoked or chewed Tobacco  in the last 2 yrs please stop smoking, stop any regular Alcohol  and or any Recreational drug use.  Wear Seat belts while driving.  Please note You were cared for by a hospitalist during your hospital stay. If you have any questions about your discharge medications or the care you received while you were in the hospital after you are discharged, you can call the unit and asked to speak with the hospitalist on call if the hospitalist that took care of you is not available. Once you are  discharged, your primary care physician will handle any further medical issues. Please note that NO REFILLS for any discharge medications will be authorized once you are discharged, as it is imperative that you return to your primary care physician (or establish a relationship with a primary care physician if you do not have one) for your aftercare needs so that they can reassess your need for medications and monitor your lab values.  You can reach the hospitalist office at phone [phone] or fax [phone]   If you do not have a primary care physician, you can call [phone] for a physician referral.

## 2018-08-23 NOTE — Progress Notes (Signed)
Progress Note  Patient Name: Elijah Watkins Date of Encounter: 08/23/2018  Primary Cardiologist:   No primary care provider on file.  (Sees MD in [facility])  Subjective   No more chest pain, NPO for a cath.  Inpatient Medications    Scheduled Meds: . aspirin  81 mg Oral Daily  . atorvastatin  80 mg Oral QHS  . chlorhexidine  15 mL Mouth Rinse BID  . enoxaparin (LOVENOX) injection  40 mg Subcutaneous Q24H  . gabapentin  600 mg Oral TID  . insulin aspart  0-9 Units Subcutaneous TID WC  . insulin aspart protamine- aspart  40 Units Subcutaneous Q supper  . insulin aspart protamine- aspart  45 Units Subcutaneous Q breakfast  . lisinopril  5 mg Oral Daily  . mouth rinse  15 mL Mouth Rinse q12n4p  . metoprolol tartrate  50 mg Oral BID  . pantoprazole  40 mg Oral Daily  . sodium chloride flush  3 mL Intravenous Q12H  . ticagrelor  90 mg Oral BID  . zolpidem  10 mg Oral QHS   Continuous Infusions: . sodium chloride Stopped (08/22/18 0544)  . sodium chloride     PRN Meds: sodium chloride, acetaminophen, acetaminophen, ALPRAZolam, hydrALAZINE, HYDROcodone-acetaminophen, morphine injection, nitroGLYCERIN, ondansetron (ZOFRAN) IV, sodium chloride flush   Vital Signs    Vitals:   08/22/18 1929 08/22/18 2000 08/23/18 0228 08/23/18 0700  BP: (!) 162/87  124/75 131/71  Pulse: 75 77 73 73  Resp: 12 10 15 13   Temp: 98.2 F (36.8 C)  97.6 F (36.4 C) 98.5 F (36.9 C)  TempSrc: Oral  Oral Oral  SpO2: 98% 97% 97% 96%  Weight:   132 kg   Height:        Intake/Output Summary (Last 24 hours) at 08/23/2018 1033 Last data filed at 08/23/2018 8325 Gross per 24 hour  Intake 3114.6 ml  Output 2300 ml  Net 814.6 ml   Filed Weights   08/21/18 0616 08/22/18 0557 08/23/18 0228  Weight: 130.7 kg 131.1 kg 132 kg    Telemetry    NSR, SB - Personally Reviewed  ECG    NA - Personally Reviewed  Physical Exam   GEN: No  acute distress.   Neck: No  JVD Cardiac: RRR, no  murmurs, rubs, or gallops.  Respiratory: Clear   to auscultation bilaterally. GI: Soft, nontender, non-distended, normal bowel sounds  MS:   No edema; No deformity. Neuro:   Nonfocal  Psych: Oriented and appropriate   Labs    Chemistry Recent Labs  Lab 08/19/18 1658 08/22/18 0613 08/23/18 0225  NA 138 138 137  K 3.7 3.8 3.4*  CL 106 105 103  CO2 24 24 22   GLUCOSE 265* 252* 203*  BUN 18 13 13   CREATININE 1.54* 1.36* 1.37*  CALCIUM 9.1 8.7* 8.7*  GFRNONAA 50* 58* 58*  GFRAA 58* >60 >60  ANIONGAP 8 9 12      Hematology Recent Labs  Lab 08/19/18 1658 08/22/18 0613 08/23/18 0225  WBC 9.0 7.7 8.7  RBC 4.48 4.37 4.41  HGB 13.2 12.7* 12.9*  HCT 39.3 38.5* 38.6*  MCV 87.7 88.1 87.5  MCH 29.5 29.1 29.3  MCHC 33.6 33.0 33.4  RDW 12.6 12.4 12.8  PLT 203 180 200    Cardiac Enzymes Recent Labs  Lab 08/20/18 0145 08/20/18 0620 08/20/18 1344  TROPONINI 0.05* 0.04* 0.03*    Recent Labs  Lab 08/19/18 1713 08/19/18 2107  TROPIPOC 0.02 0.05  BNPNo results for input(s): BNP, PROBNP in the last 168 hours.   DDimer No results for input(s): DDIMER in the last 168 hours.   Radiology    No results found.  Cardiac Studies   LHC: 08/23/2018    Previously placed Mid RCA stent (unknown type) is widely patent.  Post Atrio lesion is 90% stenosed.  A drug-eluting stent was successfully placed using a STENT SYNERGY DES 3X20.  Post intervention, there is a 0% residual stenosis.  Prox LAD to Mid LAD lesion is 25% stenosed.  Ost 1st Diag to 1st Diag lesion is 80% stenosed.  LV end diastolic pressure is mildly elevated.  The left ventricular ejection fraction is 50-55% by visual estimate.  The left ventricular systolic function is normal.  There is no aortic valve stenosis.    Patient Profile     52 y.o. male CAD PCI in 2012, HTN, DVT, DM, Presents to ED with new onset chest pain during treadmill exercise.  Assessment & Plan    CAD: s/p Previously placed  Mid RCA stent (unknown type) is widely patent. Post Atrio lesion is 90% stenosed. A drug-eluting stent was successfully placed using a STENT SYNERGY DES 3X20. Recommend uninterrupted dual antiplatelet therapy with Aspirin 81mg  daily and Ticagrelor 90mg  twice daily for a minimum of 12 months (ACS - Class I recommendation).  Continue ASA, Brilinta, atorvastatin, lisinopril and metoprolol, vitals at goal. Cath insertion site with no bleeding and good peripheral pulses.  DM:  A1c without good control.  Per primary team.    HYPERTENSION: elevated this am, we will follow.  DYSLIPIDEMIA:  Needs better diabetes control for treatment of triglycerides.  Lipitor dose has been increased this admission.   The patient can be discharged, we will arrange for an outpatient follow up.  For questions or updates, please contact [facility] Please consult [email or web address] for contact info under Cardiology/STEMI.   Signed, Tobias Alexander, MD  08/23/2018, 10:33 AM

## 2018-08-23 NOTE — Discharge Summary (Signed)
Physician Discharge Summary  Elijah Watkins NFA:[id number] DOB: 1966-08-15  PCP: Tracey Harries, MD  Admit date: 08/19/2018 Discharge date: 08/23/2018  Recommendations for Outpatient Follow-up:  1. Dr. Tracey Harries, PCP in 1 week with repeat labs (CBC & BMP). 2. Dr. Lance Muss, Cardiology: Office will arrange outpatient follow-up. 3. Elissa Hefty, NP: Office will arrange outpatient follow-up.  Home Health: None Equipment/Devices: None  Discharge Condition: Improved and stable CODE STATUS: Full Diet recommendation: Heart healthy & diabetic diet.  Discharge Diagnoses:  Principal Problem:   Unstable angina (HCC) Active Problems:   Type II diabetes mellitus with renal manifestations (HCC)   Coronary artery disease   Hypertension   HLD (hyperlipidemia)   GERD (gastroesophageal reflux disease)   CKD (chronic kidney disease), stage III [facility])   Anxiety   Brief Summary: 52 year old male with PMH of MI, CAD status post 2 stents, last in 2012, sees cardiology in [facility] (? Dr. Wynema Birch), type II DM/IDDM, GERD, HTN, HLD, DVT, morbid obesity presented to the ED on 08/20/2018 with new onset chest pain while he was on the treadmill.  Patient has been exercising at [facility] for the last 4 months, usually does the reclining bike 4 times weekly without chest pain, noted new onset of chest pain while he was doing weights which progressively got worse 10 minutes into the treadmill walk.  Some relief with sublingual NTG.  Recently started testosterone patches.  Admitted for unstable angina.  Troponins minimally elevated, flat or downward trend.  Cardiology consulted.   Assessment & Plan:  CAD with unstable angina, s/p PCI: History as indicated above.  Troponins x3: Minimal elevation with downward trend.  Cardiology was consulted and patient underwent left heart cath with successful placement of a DES in the post atrio lesion which was 90% stenosed.  Patient has no further chest  pain.  No bleeding or hematoma from right wrist cath site.  Cardiology has seen him today and cleared him for discharge.  He is to continue uninterrupted dual antiplatelet therapy including aspirin 81 mg daily + Brilinta 90 mg twice daily for a minimum of 12 months, atorvastatin increased from 40 to 80 mg at bedtime, lisinopril and metoprolol.  Patient stated that the earliest follow-up with his primary Cardiologist was on 10/8.  I discussed with [facility] Cardiology who will arrange early outpatient follow-up.  As discussed with Cardiology, also advised patient to stop taking testosterone which she had initiated 2 days PTA for importance.  May follow-up with PCP regarding this.  Poorly controlled DM 2 with renal complications: A1c 10.4 suggests poor outpatient control.  He was taking NovoLog Mix 70/30, 90 units twice daily at home.  In the hospital due to being n.p.o. for cath, he was placed on reduced dose of insulins.  Patient is to resume prior home dose of insulins at discharge.  Recommend close outpatient follow-up with his PCP and may consider Endocrinology consultation for tighter control of his DM.  Essential hypertension: controlled.  Continue metoprolol and lisinopril.  Hyperlipidemia: Atorvastatin dose increased during this admission.  Morbid obesity/Body mass index is 40.31 kg/m.  Needs to lose weight.  GERD: Continue Protonix.  Stage III chronic kidney disease: Presented with creatinine of 1.5 which may be his baseline.  Patient hydrated precath.  Creatinine reduced and stable in the 1.3 range.  Close outpatient follow-up.  Hypokalaemia: Replaced prior to discharge.  Anxiety: Continue Xanax.  History of provoked DVT: Low index of suspicion for PE this admission.   Consultants:  Cardiology  Procedures:  Left heart cath and coronary angiography, coronary stent intervention:  Conclusion:   Previously placed Mid RCA stent (unknown type) is widely patent.  Post Atrio  lesion is 90% stenosed.  A drug-eluting stent was successfully placed using a STENT SYNERGY DES 3X20.  Post intervention, there is a 0% residual stenosis.  Prox LAD to Mid LAD lesion is 25% stenosed.  Ost 1st Diag to 1st Diag lesion is 80% stenosed.  LV end diastolic pressure is mildly elevated.  The left ventricular ejection fraction is 50-55% by visual estimate.  The left ventricular systolic function is normal.  There is no aortic valve stenosis.     Recommend uninterrupted dual antiplatelet therapy with Aspirin 81mg  daily and Ticagrelor 90mg  twice daily for a minimum of 12 months (ACS - Class I recommendation).   Continue aggressive secondary prevention.    Discharge Instructions  Discharge Instructions    AMB Referral to Cardiac Rehabilitation - Phase II   Complete by:  As directed    Diagnosis:  NSTEMI   Amb Referral to Cardiac Rehabilitation   Complete by:  As directed    Diagnosis:  Coronary Stents   Call MD for:  difficulty breathing, headache or visual disturbances   Complete by:  As directed    Call MD for:  extreme fatigue   Complete by:  As directed    Call MD for:  persistant dizziness or light-headedness   Complete by:  As directed    Call MD for:  persistant nausea and vomiting   Complete by:  As directed    Call MD for:  redness, tenderness, or signs of infection (pain, swelling, redness, odor or green/yellow discharge around incision site)   Complete by:  As directed    Call MD for:  severe uncontrolled pain   Complete by:  As directed    Call MD for:  temperature >100.4   Complete by:  As directed    Diet - low sodium heart healthy   Complete by:  As directed    Diet Carb Modified   Complete by:  As directed    Increase activity slowly   Complete by:  As directed        Medication List    STOP taking these medications   ondansetron 4 MG disintegrating tablet Commonly known as:  ZOFRAN-ODT   oxycodone 5 MG capsule Commonly known as:   OXY-IR   tamsulosin 0.4 MG Caps capsule Commonly known as:  FLOMAX   Testosterone 1.62 % Gel     TAKE these medications   ALPRAZolam 1 MG tablet Commonly known as:  XANAX Take 1-2 mg by mouth 3 (three) times daily as needed for anxiety.   aspirin 81 MG chewable tablet Chew 1 tablet (81 mg total) by mouth daily. Start taking on:  08/24/2018   atorvastatin 40 MG tablet Commonly known as:  LIPITOR Take 2 tablets (80 mg total) by mouth at bedtime. What changed:  how much to take   gabapentin 600 MG tablet Commonly known as:  NEURONTIN Take 600 mg by mouth 3 (three) times daily.   HYDROcodone-acetaminophen 5-325 MG tablet Commonly known as:  NORCO/VICODIN Take 1 tablet by mouth every 6 (six) hours as needed for moderate pain.   insulin NPH-regular Human (70-30) 100 UNIT/ML injection Commonly known as:  NOVOLIN 70/30 Inject 90 Units into the skin 2 (two) times daily with a meal.   lisinopril 5 MG tablet Commonly known as:  PRINIVIL,ZESTRIL Take 5  mg by mouth daily.   metoprolol tartrate 50 MG tablet Commonly known as:  LOPRESSOR Take 50 mg by mouth 2 (two) times daily.   nitroGLYCERIN 0.4 MG SL tablet Commonly known as:  NITROSTAT Place 0.4 mg under the tongue every 5 (five) minutes as needed. x3 doses as needed for chest pain   omeprazole 20 MG capsule Commonly known as:  PRILOSEC Take 20 mg by mouth 2 (two) times daily before a meal.   ticagrelor 90 MG Tabs tablet Commonly known as:  BRILINTA Take 1 tablet (90 mg total) by mouth 2 (two) times daily.   zolpidem 10 MG tablet Commonly known as:  AMBIEN Take 10 mg by mouth at bedtime.      Follow-up Information    Elissa Hefty, NP Follow up.   Why:  their office will call you with date and time.  the number I used to reach them in [phone]    Contact information: [address] [place] [address] [phone]        Tracey Harries, MD. Schedule an appointment as soon as possible for a visit  in 1 week(s).   Specialty:  Family Medicine Why:  To be seen with repeat labs (CBC & BMP). Contact information: [address] [place] [address] [phone]        Corky Crafts, MD Follow up.   Specialties:  Cardiology, Radiology, Interventional Cardiology Why:  MDs office will call you with appointment.  Please call them back if you do not hear from them in 2-3 business days. Contact information: [address] [place] [address] [phone]          Allergies  Allergen Reactions  . Latex Hives, Swelling and Other (See Comments)    Dry cough      Procedures/Studies: Dg Chest 2 View  Result Date: 08/19/2018 CLINICAL DATA:  Onset of chest pain at the gym today EXAM: CHEST - 2 VIEW COMPARISON:  08/28/2016 FINDINGS: Normal heart size, mediastinal contours, and pulmonary vascularity. Lungs clear. No pleural effusion or pneumothorax. Bones unremarkable. IMPRESSION: Normal exam. Electronically Signed   By: Ulyses Southward M.D.   On: 08/19/2018 17:21      Subjective: No recurrence of chest pain.  No bleeding or swelling at right wrist cath site.  No other complaints reported.  Discharge Exam:  Vitals:   08/22/18 2000 08/23/18 0228 08/23/18 0700 08/23/18 1100  BP:  124/75 131/71 129/79  Pulse: 77 73 73 96  Resp: 10 15 13 11   Temp:  97.6 F (36.4 C) 98.5 F (36.9 C) 98 F (36.7 C)  TempSrc:  Oral Oral Oral  SpO2: 97% 97% 96% 97%  Weight:  132 kg    Height:        General exam: Pleasant young male, moderately built and morbidly obese, sitting up comfortably in bed this morning. Respiratory system: Clear to auscultation. Respiratory effort normal. Cardiovascular system: S1 & S2 heard, RRR. No JVD, murmurs, rubs, gallops or clicks. No pedal edema.  Telemetry personally reviewed: Sinus rhythm. Gastrointestinal system: Abdomen is nondistended, soft and nontender. No organomegaly or masses felt. Normal bowel sounds  heard. Central nervous system: Alert and oriented. No focal neurological deficits. Extremities: Symmetric 5 x 5 power.  Right wrist cath site with clean and intact dressing and no bleeding or hematoma noted.  Good peripheral radial pulse felt. Skin: No rashes, lesions or ulcers.  Multiple tattoos on his upper extremities. Psychiatry: Judgement  and insight appear normal. Mood & affect appropriate.     The results of significant diagnostics from this hospitalization (including imaging, microbiology, ancillary and laboratory) are listed below for reference.     Labs: CBC: Recent Labs  Lab 08/19/18 1658 08/22/18 0613 08/23/18 0225  WBC 9.0 7.7 8.7  HGB 13.2 12.7* 12.9*  HCT 39.3 38.5* 38.6*  MCV 87.7 88.1 87.5  PLT 203 180 200   Basic Metabolic Panel: Recent Labs  Lab 08/19/18 1658 08/22/18 0613 08/23/18 0225  NA 138 138 137  K 3.7 3.8 3.4*  CL 106 105 103  CO2 24 24 22   GLUCOSE 265* 252* 203*  BUN 18 13 13   CREATININE 1.54* 1.36* 1.37*  CALCIUM 9.1 8.7* 8.7*   Cardiac Enzymes: Recent Labs  Lab 08/20/18 0145 08/20/18 0620 08/20/18 1344  TROPONINI 0.05* 0.04* 0.03*   CBG: Recent Labs  Lab 08/22/18 1231 08/22/18 1651 08/22/18 2134 08/23/18 0622 08/23/18 1150  GLUCAP 217* 169* 243* 214* 156*      Time coordinating discharge: 40 minutes  SIGNED:  Marcellus Scott, MD, FACP, [facility]. [facility] Pager [phone]  If 7PM-7AM, please contact night-coverage [email or web address] Password [facility] 08/23/2018, 12:48 PM

## 2018-08-23 NOTE — Care Management (Signed)
08-23-18  BENEFITS CHECK :  # 2.   S/W    JENNIFER  @ [employer] # [phone]   BRILINT A 90 MG BID COVER- YES CO-PAY-$ 80.00 TIER- 4 DRUG PRIOR APPROVAL- NO NO DEDUCTIBLE  PREFERRED PHARMACY : YES   [facility] AND [facility]

## 2018-08-23 NOTE — Progress Notes (Signed)
CARDIAC REHAB PHASE I   PRE:  Rate/Rhythm: 73 SR  BP:  Supine: 133/77  Sitting:   Standing:    SaO2:   MODE:  Ambulation: 800 ft   POST:  Rate/Rhythm: 89 SR  BP:  Supine:   Sitting: 146/95  Standing:    SaO2:  0850-0945 Pt walked 800 ft on RA with steady gait and tolerated well. Discussed brilinta and stent. Needs brilinta card RN aware. Reviewed NTG use, ex ed, gave diabetic and heart healthy diets. Referred to CRP 2 [place]. Pt is very interested in CRP 2 as he is going to gym now and trying to be active and he has had several doctors recommend keto diet. Discussed with pt that it is good to have dietitian help with this diet. Instructed pt not to return to weight lifting in gym until he goes back to see cardiologist.  Walking instructions given.   Luetta Nutting, RN BSN  08/23/2018 9:41 AM

## 2018-08-23 NOTE — Progress Notes (Signed)
Inpatient Diabetes Program Recommendations  AACE/ADA: New Consensus Statement on Inpatient Glycemic Control (2015)  Target Ranges:  Prepandial:   less than 140 mg/dL      Peak postprandial:   less than 180 mg/dL (1-2 hours)      Critically ill patients:  140 - 180 mg/dL   Lab Results  Component Value Date   GLUCAP 156 (H) 08/23/2018   HGBA1C 10.4 (H) 08/20/2018   Spoke with patient and wife at bedside about DM management. Patient sees Dr. Shawnee Watkins, Endocrinologist, for DM management and last saw him 3 months ago. At that time his insulin regimen was reduced from 100 units BID to 90 units BID because of an improved A1c.  Discussed Current A1c 10.4%, discussed glucose and A1c goals.  Patient mentioned he has had 2-3 cortisone shots over the past 2-3 months. Patient has recently started working out at [employer], however, was only doing weight lifting. Discussed exercise in addition to nutrition, stress, sickness, and medications on diabetes control. Patient reports not liking vegetables and eating large portions of the other food groups. Discussed carbohydrates, carbohydrate goals per day and meal, along with portion sizes.  Discussed with patient to reschedule with Dr. Shawnee Watkins to follow up on insulin adjustment and for Dr. Shawnee Watkins to adjust insulin at times when he is on steroids for better glucose control during those times.   Thanks,  Elijah Deem RN, MSN, [facility] Inpatient Diabetes Coordinator Team Pager [phone] (8a-5p)

## 2018-08-25 ENCOUNTER — Telehealth: Payer: Self-pay | Admitting: Interventional Cardiology

## 2018-08-25 NOTE — Telephone Encounter (Signed)
Follow Up:   Pt had a stent put in on 08-22-18. He wants to know if he should go back to work next week or wait until he see [name] on 09-02-18?

## 2018-08-25 NOTE — Telephone Encounter (Signed)
Advised the pt that ideally if his job is ok with it, he should wait until he see's Berton BonJanine Hammond NP on 09/02/18, for post-hospital follow-up and s/p stent placement appt, and she can then advise on when its safe for him to return to work from a cardiac perspective.  Pt states his job is being very cooperative and he will wait until that appt, and ask Janine to advise on his return to work status then.  Pt verbalized understanding and agrees with this plan.

## 2018-08-26 ENCOUNTER — Telehealth (HOSPITAL_COMMUNITY): Payer: Self-pay

## 2018-08-26 NOTE — Telephone Encounter (Signed)
Patients insurance is active and benefits verified through [employer] - No co-pay, deductible amount of $4,000/$4,000 has been met, out of pocket amount of $6,300/$5,204.32 has been met, 30% co-insurance, and no pre-authorization is required. Passport/reference [phone]  Patients paperwork in f/u appt bin for 9/20.

## 2018-08-31 DIAGNOSIS — Z95820 Peripheral vascular angioplasty status with implants and grafts: Secondary | ICD-10-CM | POA: Insufficient documentation

## 2018-08-31 NOTE — Progress Notes (Signed)
Cardiology Office Note:    Date:  09/02/2018   ID:  Elijah Watkins, DOB 05-Oct-1966, MRN [id number]  PCP:  Tracey Harries, MD  Cardiologist:  No primary care provider on file.  Referring MD: Tracey Harries, MD   Chief Complaint  Patient presents with  . Hospitalization Follow-up    Post PCI    History of Present Illness:    Elijah Watkins is a 52 y.o. male with a past medical history significant for CAD PCI in 2012, HTN, DVT, DM poorly controlled, Presents to ED with new onset chest pain during treadmill exercise. He was admitted to [facility] on 08/19/18 for new onset of exertional chest pain.   He usually follows at [facility] in [place] for cardiology. His cardiologist retired and he is to see Dr. Bary Castilla. He was admitted to [facility] for chest pain He had minimal troponin elevation that down trended.  He underwent cardiac catheterization with successful DES to post atrio lesion which was 90% stenosed.    He was started on Brilinta 90 mg twice daily and aspirin 81 mg twice daily with planned uninterrupted DAPT for 12 months.  His atorvastatin dose was increased from 40 mg to 80 mg.  He is here today for hospital follow-up.  He denies chest pain or shortness of breath.  His right radial cath site is still a little tender but looks okay. He has changed is diet with reduced portions, more fruits and vegetables, less red meat. He is now walking 15 minutes once or twice a day with some fatigue but plans to slowly increase his time.He wants to go back to his gym.  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   Past Medical History:  Diagnosis Date  . Anger    anger issues  . Anxiety   . Arthritis   . CAD (coronary artery disease)   . Dental crowns present   . Diabetes mellitus    IDDM - fasting 70-120s  . GERD (gastroesophageal reflux disease)   . History of concussion   . History of DVT (deep vein thrombosis) 03/2005  . History of peptic ulcer age 17  .  Hyperlipidemia   . Hypertension    under control, has been on med. > 7 yrs.  . Myocardial infarction (HCC)    stents  . Nephrolithiasis    states has 3 stones in kidney, but no current problems  . Overweight(278.02)   . Pilonidal cyst 05/2012   area is open, not draining  . Pneumonia    hx  . PONV (postoperative nausea and vomiting)   . S/P coronary artery stent placement 2001, 05/2011  . Seasonal allergies     Past Surgical History:  Procedure Laterality Date  . CARDIAC CATHETERIZATION  10/06/2010  . CARPAL TUNNEL RELEASE Right   . CHOLECYSTECTOMY N/A 08/18/2013   Procedure: LAPAROSCOPIC CHOLECYSTECTOMY WITH INTRAOPERATIVE CHOLANGIOGRAM;  Surgeon: Robyne Askew, MD;  Location: [facility] OR;  Service: General;  Laterality: N/A;  . CHONDROPLASTY  08/04/2004   right knee  . CORONARY ANGIOPLASTY WITH STENT PLACEMENT     x 2; last time 05/2011  . CORONARY STENT INTERVENTION N/A 08/22/2018   Procedure: CORONARY STENT INTERVENTION;  Surgeon: Corky Crafts, MD;  Location: [facility] INVASIVE CV LAB;  Service: Cardiovascular;  Laterality: N/A;  . DISTAL BICEPS TENDON REPAIR Left 02/19/2016   Procedure: DISTAL BICEPS TENDON REPAIR;  Surgeon: Jodi Geralds, MD;  Location: [facility];  Service: Orthopedics;  Laterality: Left;  . KIDNEY STONE SURGERY  1988 - 1998   x 3  . knee arthroscopic     left knee  . KNEE CARTILAGE SURGERY  10/2003, 11/2003   Also had bursa removed  . LEFT HEART CATH AND CORONARY ANGIOGRAPHY N/A 08/22/2018   Procedure: LEFT HEART CATH AND CORONARY ANGIOGRAPHY;  Surgeon: Corky Crafts, MD;  Location: [facility] INVASIVE CV LAB;  Service: Cardiovascular;  Laterality: N/A;  . PILONIDAL CYST DRAINAGE  05/19/2012   Procedure: IRRIGATION AND DEBRIDEMENT PILONIDAL CYST;  Surgeon: Robyne Askew, MD;  Location: [facility];  Service: General;  Laterality: N/A;  I & D Pilonidal abscess, placement of acell  . PREPATELLAR BURSA EXCISION  12/05/2004   right  . WRIST  SURGERY  02/1997   right    Current Medications: Current Meds  Medication Sig  . allopurinol (ZYLOPRIM) 300 MG tablet Take 300 mg by mouth daily.  Marland Kitchen ALPRAZolam (XANAX) 1 MG tablet Take 1-2 mg by mouth 3 (three) times daily as needed for anxiety.   Marland Kitchen aspirin 81 MG chewable tablet Chew 1 tablet (81 mg total) by mouth daily.  Marland Kitchen atorvastatin (LIPITOR) 80 MG tablet Take 80 mg by mouth daily.  Marland Kitchen gabapentin (NEURONTIN) 600 MG tablet Take 600 mg by mouth 3 (three) times daily.   Marland Kitchen HYDROcodone-acetaminophen (NORCO/VICODIN) 5-325 MG tablet Take 1 tablet by mouth every 6 (six) hours as needed for moderate pain.  . Insulin Degludec 200 UNIT/ML SOPN Inject into the skin as directed.  . insulin NPH-regular Human (NOVOLIN 70/30) (70-30) 100 UNIT/ML injection Inject 90 Units into the skin 2 (two) times daily with a meal.  . lisinopril (PRINIVIL,ZESTRIL) 5 MG tablet Take 5 mg by mouth daily.  . metoprolol (LOPRESSOR) 50 MG tablet Take 50 mg by mouth 2 (two) times daily.  . nitroGLYCERIN (NITROSTAT) 0.4 MG SL tablet Place 0.4 mg under the tongue every 5 (five) minutes as needed. x3 doses as needed for chest pain  . omeprazole (PRILOSEC) 20 MG capsule Take 20 mg by mouth 2 (two) times daily before a meal.  . Semaglutide,0.25 or 0.5MG /DOS, (OZEMPIC, 0.25 OR 0.5 MG/DOSE,) 2 MG/1.5ML SOPN Inject into the skin as directed.  . ticagrelor (BRILINTA) 90 MG TABS tablet Take 1 tablet (90 mg total) by mouth 2 (two) times daily.  Marland Kitchen zolpidem (AMBIEN) 10 MG tablet Take 10 mg by mouth at bedtime.      Allergies:   Latex   Social History   Socioeconomic History  . Marital status: Married    Spouse name: Not on file  . Number of children: Not on file  . Years of education: Not on file  . Highest education level: Not on file  Occupational History  . Not on file  Social Needs  . Financial resource strain: Not on file  . Food insecurity:    Worry: Not on file    Inability: Not on file  . Transportation needs:     Medical: Not on file    Non-medical: Not on file  Tobacco Use  . Smoking status: Former Smoker    Last attempt to quit: 02/24/1984    Years since quitting: 34.5  . Smokeless tobacco: Never Used  Substance and Sexual Activity  . Alcohol use: Yes    Comment: rare  . Drug use: No  . Sexual activity: Not on file  Lifestyle  . Physical activity:    Days per week:  Not on file    Minutes per session: Not on file  . Stress: Not on file  Relationships  . Social connections:    Talks on phone: Not on file    Gets together: Not on file    Attends religious service: Not on file    Active member of club or organization: Not on file    Attends meetings of clubs or organizations: Not on file    Relationship status: Not on file  Other Topics Concern  . Not on file  Social History Narrative  . Not on file     Family History: The patient's family history includes Alcohol abuse (age of onset: 36) in his father; Diabetes (age of onset: 43) in his mother; Hypertension in his father. ROS:   Please see the history of present illness.     All other systems reviewed and are negative.  EKGs/Labs/Other Studies Reviewed:    The following studies were reviewed today:  Left Heart Cath 08/22/2018  Previously placed Mid RCA stent (unknown type) is widely patent.  Post Atrio lesion is 90% stenosed.  A drug-eluting stent was successfully placed using a STENT SYNERGY DES 3X20.  Post intervention, there is a 0% residual stenosis.  Prox LAD to Mid LAD lesion is 25% stenosed.  Ost 1st Diag to 1st Diag lesion is 80% stenosed.  LV end diastolic pressure is mildly elevated.  The left ventricular ejection fraction is 50-55% by visual estimate.  The left ventricular systolic function is normal.  There is no aortic valve stenosis.  Recommend uninterrupted dual antiplatelet therapy with Aspirin 81mg  daily and Ticagrelor 90mg  twice daily for a minimum of 12 months (ACS - Class I recommendation).    Continue aggressive secondary prevention.   EKG:  EKG is not ordered today.    Recent Labs: 08/23/2018: BUN 13; Creatinine, Ser 1.37; Hemoglobin 12.9; Platelets 200; Potassium 3.4; Sodium 137   Recent Lipid Panel    Component Value Date/Time   CHOL 123 08/20/2018 0620   TRIG 349 (H) 08/20/2018 0620   HDL 23 (L) 08/20/2018 0620   CHOLHDL 5.3 08/20/2018 0620   VLDL 70 (H) 08/20/2018 0620   LDLCALC 30 08/20/2018 0620    Physical Exam:    VS:  BP 122/82   Pulse 68   Ht 5\' 11"  (1.803 m)   Wt 287 lb 12 oz (130.5 kg)   SpO2 98%   BMI 40.13 kg/m     Wt Readings from Last 3 Encounters:  09/02/18 287 lb 12 oz (130.5 kg)  08/23/18 291 lb 0.1 oz (132 kg)  03/15/16 266 lb (120.7 kg)     Physical Exam  Constitutional: He is oriented to person, place, and time. He appears well-developed and well-nourished. No distress.  HENT:  Head: Normocephalic and atraumatic.  Neck: Normal range of motion. Neck supple. No JVD present.  Cardiovascular: Normal rate, regular rhythm, normal heart sounds and intact distal pulses. Exam reveals no gallop and no friction rub.  No murmur heard. Pulmonary/Chest: Effort normal and breath sounds normal. No respiratory distress. He has no wheezes. He has no rales.  Abdominal: Soft. Bowel sounds are normal.  Musculoskeletal: Normal range of motion. He exhibits no edema or deformity.  Neurological: He is alert and oriented to person, place, and time.  Skin: Skin is warm and dry.  Psychiatric: He has a normal mood and affect. His behavior is normal. Judgment and thought content normal.  Vitals reviewed.   ASSESSMENT:    1.  S/P angioplasty with stent   2. Type 2 diabetes mellitus with stage 3 chronic kidney disease, with long-term current use of insulin (HCC)   3. Essential hypertension   4. Dyslipidemia, goal LDL below 70   5. Obesity, Class III, BMI 40-49.9 (morbid obesity) (HCC)   6. CKD (chronic kidney disease), stage III (HCC)    PLAN:    In  order of problems listed above:  CAD: Cardiac cath 08/22/18 showed patent mid RCA stent, Post Atrio lesion is 90% stenosed. A drug-eluting stent was successfully placed. Started on Ticagrelor 90 mg BID and aspirin 81 mg with plan for uninterrupted DAPT for a minimum of 12 months. Continues on BB, statin and ACE-I Risk factor management: wt loss, BP control, better diabetes control, cholesterol management. Diet, exercise.   OK to go back to work.  He can return to his gym but focus on gentle cardio for now and build up. I advised him to hold off any heavy lifting for a few more weeks.   I will make follow up appt for our practice so he won't get lost to follow up. [facility] he will cancel the appt for here. He agrees.   DM type 2 on insulin: A1c 10.4 08/20/18. Needs better blood sugar control to reduce cardiac risk. Goal <7. We discussed need for better control to reduce his future cardiovascular risk and preserve his kidneys.  We discussed changes in diet and exercise. He needs to see his PCP to help get better control.   Hypertension: On lisinopril 5 mg daily, metoprolol 50 mg BID. BP well controlled.   Dyslipidemia: Needs better diabetes control for treatment of triglycerides. LDL was 30 08/20/18.  Lipitor dose has been increased. At LDL goal <70  Morbid Obesity: Body mass index is 40.13 kg/m. Discussed weight loss with diet and exercise.   CKD stage 3: Pt Presented to the hospital with creatinine of 1.5 which may be his baseline. Patient hydrated precath. Creatinine reduced and stable in the 1.3 range. Will check BMet.   Medication Adjustments/Labs and Tests Ordered: Current medicines are reviewed at length with the patient today.  Concerns regarding medicines are outlined above. Labs and tests ordered and medication changes are outlined in the patient instructions below:  Patient Instructions  Medication Instructions: Your physician recommends that you  continue on your current medications as directed. Please refer to the Current Medication list given to you today.   Labwork: None Ordered  Procedures/Testing: None Ordered  Follow-Up: Your physician recommends that you schedule a follow-up appointment in: 3-4 months with Hazle CocaNina Ashtyn Meland,NP   Any Additional Special Instructions Will Be Listed Below (If Applicable).   DASH Eating Plan DASH stands for "Dietary Approaches to Stop Hypertension." The DASH eating plan is a healthy eating plan that has been shown to reduce high blood pressure (hypertension). It may also reduce your risk for type 2 diabetes, heart disease, and stroke. The DASH eating plan may also help with weight loss. What are tips for following this plan? General guidelines  Avoid eating more than 2,300 mg (milligrams) of salt (sodium) a day. If you have hypertension, you may need to reduce your sodium intake to 1,500 mg a day.  Limit alcohol intake to no more than 1 drink a day for nonpregnant women and 2 drinks a day for men. One drink equals 12 oz of beer, 5 oz of wine, or 1 oz of hard liquor.  Work  with your health care provider to maintain a healthy body weight or to lose weight. Ask what an ideal weight is for you.  Get at least 30 minutes of exercise that causes your heart to beat faster (aerobic exercise) most days of the week. Activities may include walking, swimming, or biking.  Work with your health care provider or diet and nutrition specialist (dietitian) to adjust your eating plan to your individual calorie needs. Reading food labels  Check food labels for the amount of sodium per serving. Choose foods with less than 5 percent of the Daily Value of sodium. Generally, foods with less than 300 mg of sodium per serving fit into this eating plan.  To find whole grains, look for the word "whole" as the first word in the ingredient list. Shopping  Buy products labeled as "low-sodium" or "no salt added."  Buy  fresh foods. Avoid canned foods and premade or frozen meals. Cooking  Avoid adding salt when cooking. Use salt-free seasonings or herbs instead of table salt or sea salt. Check with your health care provider or pharmacist before using salt substitutes.  Do not fry foods. Cook foods using healthy methods such as baking, boiling, grilling, and broiling instead.  Cook with heart-healthy oils, such as olive, canola, soybean, or sunflower oil. Meal planning   Eat a balanced diet that includes: ? 5 or more servings of fruits and vegetables each day. At each meal, try to fill half of your plate with fruits and vegetables. ? Up to 6-8 servings of whole grains each day. ? Less than 6 oz of lean meat, poultry, or fish each day. A 3-oz serving of meat is about the same size as a deck of cards. One egg equals 1 oz. ? 2 servings of low-fat dairy each day. ? A serving of nuts, seeds, or beans 5 times each week. ? Heart-healthy fats. Healthy fats called Omega-3 fatty acids are found in foods such as flaxseeds and coldwater fish, like sardines, salmon, and mackerel.  Limit how much you eat of the following: ? Canned or prepackaged foods. ? Food that is high in trans fat, such as fried foods. ? Food that is high in saturated fat, such as fatty meat. ? Sweets, desserts, sugary drinks, and other foods with added sugar. ? Full-fat dairy products.  Do not salt foods before eating.  Try to eat at least 2 vegetarian meals each week.  Eat more home-cooked food and less restaurant, buffet, and fast food.  When eating at a restaurant, ask that your food be prepared with less salt or no salt, if possible. What foods are recommended? The items listed may not be a complete list. Talk with your dietitian about what dietary choices are best for you. Grains Whole-grain or whole-wheat bread. Whole-grain or whole-wheat pasta. Brown rice. Orpah Cobb. Bulgur. Whole-grain and low-sodium cereals. Pita bread.  Low-fat, low-sodium crackers. Whole-wheat flour tortillas. Vegetables Fresh or frozen vegetables (raw, steamed, roasted, or grilled). Low-sodium or reduced-sodium tomato and vegetable juice. Low-sodium or reduced-sodium tomato sauce and tomato paste. Low-sodium or reduced-sodium canned vegetables. Fruits All fresh, dried, or frozen fruit. Canned fruit in natural juice (without added sugar). Meat and other protein foods Skinless chicken or [place]. Ground chicken or [place]. Pork with fat trimmed off. Fish and seafood. Egg whites. Dried beans, peas, or lentils. Unsalted nuts, nut butters, and seeds. Unsalted canned beans. Lean cuts of beef with fat trimmed off. Low-sodium, lean deli meat. Dairy Low-fat (1%) or fat-free (skim) milk.  Fat-free, low-fat, or reduced-fat cheeses. Nonfat, low-sodium ricotta or cottage cheese. Low-fat or nonfat yogurt. Low-fat, low-sodium cheese. Fats and oils Soft margarine without trans fats. Vegetable oil. Low-fat, reduced-fat, or light mayonnaise and salad dressings (reduced-sodium). Canola, safflower, olive, soybean, and sunflower oils. Avocado. Seasoning and other foods Herbs. Spices. Seasoning mixes without salt. Unsalted popcorn and pretzels. Fat-free sweets. What foods are not recommended? The items listed may not be a complete list. Talk with your dietitian about what dietary choices are best for you. Grains Baked goods made with fat, such as croissants, muffins, or some breads. Dry pasta or rice meal packs. Vegetables Creamed or fried vegetables. Vegetables in a cheese sauce. Regular canned vegetables (not low-sodium or reduced-sodium). Regular canned tomato sauce and paste (not low-sodium or reduced-sodium). Regular tomato and vegetable juice (not low-sodium or reduced-sodium). Rosita Fire. Olives. Fruits Canned fruit in a light or heavy syrup. Fried fruit. Fruit in cream or butter sauce. Meat and other protein foods Fatty cuts of meat. Ribs. Fried meat. Tomasa Blase.  Sausage. Bologna and other processed lunch meats. Salami. Fatback. Hotdogs. Bratwurst. Salted nuts and seeds. Canned beans with added salt. Canned or smoked fish. Whole eggs or egg yolks. Chicken or [place] with skin. Dairy Whole or 2% milk, cream, and half-and-half. Whole or full-fat cream cheese. Whole-fat or sweetened yogurt. Full-fat cheese. Nondairy creamers. Whipped toppings. Processed cheese and cheese spreads. Fats and oils Butter. Stick margarine. Lard. Shortening. Ghee. Bacon fat. Tropical oils, such as coconut, palm kernel, or palm oil. Seasoning and other foods Salted popcorn and pretzels. Onion salt, garlic salt, seasoned salt, table salt, and sea salt. Worcestershire sauce. Tartar sauce. Barbecue sauce. Teriyaki sauce. Soy sauce, including reduced-sodium. Steak sauce. Canned and packaged gravies. Fish sauce. Oyster sauce. Cocktail sauce. Horseradish that you find on the shelf. Ketchup. Mustard. Meat flavorings and tenderizers. Bouillon cubes. Hot sauce and Tabasco sauce. Premade or packaged marinades. Premade or packaged taco seasonings. Relishes. Regular salad dressings. Where to find more information:  National Heart, Lung, and Blood Institute: PopSteam.is  American Heart Association: www.heart.org Summary  The DASH eating plan is a healthy eating plan that has been shown to reduce high blood pressure (hypertension). It may also reduce your risk for type 2 diabetes, heart disease, and stroke.  With the DASH eating plan, you should limit salt (sodium) intake to 2,300 mg a day. If you have hypertension, you may need to reduce your sodium intake to 1,500 mg a day.  When on the DASH eating plan, aim to eat more fresh fruits and vegetables, whole grains, lean proteins, low-fat dairy, and heart-healthy fats.  Work with your health care provider or diet and nutrition specialist (dietitian) to adjust your eating plan to your individual calorie needs. This information is not intended  to replace advice given to you by your health care provider. Make sure you discuss any questions you have with your health care provider. Document Released: 11/19/2011 Document Revised: 11/23/2016 Document Reviewed: 11/23/2016 Elsevier Interactive Patient Education  [employer].    If you need a refill on your cardiac medications before your next appointment, please call your pharmacy.      Signed, Berton Bon, NP  09/02/2018 8:52 AM    [facility]

## 2018-09-02 ENCOUNTER — Encounter: Payer: Self-pay | Admitting: Cardiology

## 2018-09-02 ENCOUNTER — Ambulatory Visit: Payer: BLUE CROSS/BLUE SHIELD | Admitting: Cardiology

## 2018-09-02 VITALS — BP 122/82 | HR 68 | Ht 71.0 in | Wt 287.8 lb

## 2018-09-02 DIAGNOSIS — I10 Essential (primary) hypertension: Secondary | ICD-10-CM

## 2018-09-02 DIAGNOSIS — E785 Hyperlipidemia, unspecified: Secondary | ICD-10-CM

## 2018-09-02 DIAGNOSIS — E1122 Type 2 diabetes mellitus with diabetic chronic kidney disease: Secondary | ICD-10-CM

## 2018-09-02 DIAGNOSIS — N183 Chronic kidney disease, stage 3 unspecified: Secondary | ICD-10-CM

## 2018-09-02 DIAGNOSIS — Z95820 Peripheral vascular angioplasty status with implants and grafts: Secondary | ICD-10-CM

## 2018-09-02 DIAGNOSIS — Z794 Long term (current) use of insulin: Secondary | ICD-10-CM

## 2018-09-02 DIAGNOSIS — E66813 Obesity, class 3: Secondary | ICD-10-CM | POA: Insufficient documentation

## 2018-09-02 NOTE — Patient Instructions (Signed)
Medication Instructions: Your physician recommends that you continue on your current medications as directed. Please refer to the Current Medication list given to you today.   Labwork: None Ordered  Procedures/Testing: None Ordered  Follow-Up: Your physician recommends that you schedule a follow-up appointment in: 3-4 months with Hazle Coca   Any Additional Special Instructions Will Be Listed Below (If Applicable).   DASH Eating Plan DASH stands for "Dietary Approaches to Stop Hypertension." The DASH eating plan is a healthy eating plan that has been shown to reduce high blood pressure (hypertension). It may also reduce your risk for type 2 diabetes, heart disease, and stroke. The DASH eating plan may also help with weight loss. What are tips for following this plan? General guidelines  Avoid eating more than 2,300 mg (milligrams) of salt (sodium) a day. If you have hypertension, you may need to reduce your sodium intake to 1,500 mg a day.  Limit alcohol intake to no more than 1 drink a day for nonpregnant women and 2 drinks a day for men. One drink equals 12 oz of beer, 5 oz of wine, or 1 oz of hard liquor.  Work with your health care provider to maintain a healthy body weight or to lose weight. Ask what an ideal weight is for you.  Get at least 30 minutes of exercise that causes your heart to beat faster (aerobic exercise) most days of the week. Activities may include walking, swimming, or biking.  Work with your health care provider or diet and nutrition specialist (dietitian) to adjust your eating plan to your individual calorie needs. Reading food labels  Check food labels for the amount of sodium per serving. Choose foods with less than 5 percent of the Daily Value of sodium. Generally, foods with less than 300 mg of sodium per serving fit into this eating plan.  To find whole grains, look for the word "whole" as the first word in the ingredient list. Shopping  Buy  products labeled as "low-sodium" or "no salt added."  Buy fresh foods. Avoid canned foods and premade or frozen meals. Cooking  Avoid adding salt when cooking. Use salt-free seasonings or herbs instead of table salt or sea salt. Check with your health care provider or pharmacist before using salt substitutes.  Do not fry foods. Cook foods using healthy methods such as baking, boiling, grilling, and broiling instead.  Cook with heart-healthy oils, such as olive, canola, soybean, or sunflower oil. Meal planning   Eat a balanced diet that includes: ? 5 or more servings of fruits and vegetables each day. At each meal, try to fill half of your plate with fruits and vegetables. ? Up to 6-8 servings of whole grains each day. ? Less than 6 oz of lean meat, poultry, or fish each day. A 3-oz serving of meat is about the same size as a deck of cards. One egg equals 1 oz. ? 2 servings of low-fat dairy each day. ? A serving of nuts, seeds, or beans 5 times each week. ? Heart-healthy fats. Healthy fats called Omega-3 fatty acids are found in foods such as flaxseeds and coldwater fish, like sardines, salmon, and mackerel.  Limit how much you eat of the following: ? Canned or prepackaged foods. ? Food that is high in trans fat, such as fried foods. ? Food that is high in saturated fat, such as fatty meat. ? Sweets, desserts, sugary drinks, and other foods with added sugar. ? Full-fat dairy products.  Do not salt  foods before eating.  Try to eat at least 2 vegetarian meals each week.  Eat more home-cooked food and less restaurant, buffet, and fast food.  When eating at a restaurant, ask that your food be prepared with less salt or no salt, if possible. What foods are recommended? The items listed may not be a complete list. Talk with your dietitian about what dietary choices are best for you. Grains Whole-grain or whole-wheat bread. Whole-grain or whole-wheat pasta. Brown rice. Orpah Cobbatmeal. Quinoa.  Bulgur. Whole-grain and low-sodium cereals. Pita bread. Low-fat, low-sodium crackers. Whole-wheat flour tortillas. Vegetables Fresh or frozen vegetables (raw, steamed, roasted, or grilled). Low-sodium or reduced-sodium tomato and vegetable juice. Low-sodium or reduced-sodium tomato sauce and tomato paste. Low-sodium or reduced-sodium canned vegetables. Fruits All fresh, dried, or frozen fruit. Canned fruit in natural juice (without added sugar). Meat and other protein foods Skinless chicken or Malawiturkey. Ground chicken or Mal[place]iturkey. Pork with fat trimmed off. Fish and seafood. Egg whites. Dried beans, peas, or lentils. Unsalted nuts, nut butters, and seeds. Unsalted canned beans. Lean cuts of beef with fat trimmed off. Low-sodium, lean deli meat. Dairy Low-fat (1%) or fat-free (skim) milk. Fat-free, low-fat, or reduced-fat cheeses. Nonfat, low-sodium ricotta or cottage cheese. Low-fat or nonfat yogurt. Low-fat, low-sodium cheese. Fats and oils Soft margarine without trans fats. Vegetable oil. Low-fat, reduced-fat, or light mayonnaise and salad dressings (reduced-sodium). Canola, safflower, olive, soybean, and sunflower oils. Avocado. Seasoning and other foods Herbs. Spices. Seasoning mixes without salt. Unsalted popcorn and pretzels. Fat-free sweets. What foods are not recommended? The items listed may not be a complete list. Talk with your dietitian about what dietary choices are best for you. Grains Baked goods made with fat, such as croissants, muffins, or some breads. Dry pasta or rice meal packs. Vegetables Creamed or fried vegetables. Vegetables in a cheese sauce. Regular canned vegetables (not low-sodium or reduced-sodium). Regular canned tomato sauce and paste (not low-sodium or reduced-sodium). Regular tomato and vegetable juice (not low-sodium or reduced-sodium). Rosita FirePickles. Olives. Fruits Canned fruit in a light or heavy syrup. Fried fruit. Fruit in cream or butter sauce. Meat and other  protein foods Fatty cuts of meat. Ribs. Fried meat. Tomasa BlaseBacon. Sausage. Bologna and other processed lunch meats. Salami. Fatback. Hotdogs. Bratwurst. Salted nuts and seeds. Canned beans with added salt. Canned or smoked fish. Whole eggs or egg yolks. Chicken or Malawiturkey with skin. Dairy Whole or 2% milk, cream, and half-and-half. Whole or full-fat cream cheese. Whole-fat or sweetened yogurt. Full-fat cheese. Nondairy creamers. Whipped toppings. Processed cheese and cheese spreads. Fats and oils Butter. Stick margarine. Lard. Shortening. Ghee. Bacon fat. Tropical oils, such as coconut, palm kernel, or palm oil. Seasoning and other foods Salted popcorn and pretzels. Onion salt, garlic salt, seasoned salt, table salt, and sea salt. Worcestershire sauce. Tartar sauce. Barbecue sauce. Teriyaki sauce. Soy sauce, including reduced-sodium. Steak sauce. Canned and packaged gravies. Fish sauce. Oyster sauce. Cocktail sauce. Horseradish that you find on the shelf. Ketchup. Mustard. Meat flavorings and tenderizers. Bouillon cubes. Hot sauce and Tabasco sauce. Premade or packaged marinades. Premade or packaged taco seasonings. Relishes. Regular salad dressings. Where to find more information:  National Heart, Lung, and Blood Institute: PopSteam.iswww.nhlbi.nih.gov  American Heart Association: www.heart.org Summary  The DASH eating plan is a healthy eating plan that has been shown to reduce high blood pressure (hypertension). It may also reduce your risk for type 2 diabetes, heart disease, and stroke.  With the DASH eating plan, you should limit salt (sodium) intake to 2,300  mg a day. If you have hypertension, you may need to reduce your sodium intake to 1,500 mg a day.  When on the DASH eating plan, aim to eat more fresh fruits and vegetables, whole grains, lean proteins, low-fat dairy, and heart-healthy fats.  Work with your health care provider or diet and nutrition specialist (dietitian) to adjust your eating plan to your  individual calorie needs. This information is not intended to replace advice given to you by your health care provider. Make sure you discuss any questions you have with your health care provider. Document Released: 11/19/2011 Document Revised: 11/23/2016 Document Reviewed: 11/23/2016 Elsevier Interactive Patient Education  [employer].    If you need a refill on your cardiac medications before your next appointment, please call your pharmacy.

## 2018-09-05 ENCOUNTER — Telehealth (HOSPITAL_COMMUNITY): Payer: Self-pay

## 2018-09-05 NOTE — Telephone Encounter (Signed)
Called and spoke with patient in regards to Cardiac Rehab - Patient stated he will not be able to afford the co-insurance for Phase II. Informed patient of our Cardiac Rehab Maintenance program - patient stated he will be able to manage that. Faxed over Maintenance form for Dr.Varanasi as that is who he did his f/u with. Waiting on fax.

## 2018-09-06 ENCOUNTER — Telehealth: Payer: Self-pay | Admitting: Interventional Cardiology

## 2018-09-06 ENCOUNTER — Telehealth: Payer: Self-pay | Admitting: Cardiology

## 2018-09-06 DIAGNOSIS — N183 Chronic kidney disease, stage 3 unspecified: Secondary | ICD-10-CM

## 2018-09-06 NOTE — Telephone Encounter (Signed)
Release form mailed to pt's home address.

## 2018-09-06 NOTE — Telephone Encounter (Signed)
Spoke with pt he will come in on 09/08/18 for lab work

## 2018-09-06 NOTE — Telephone Encounter (Signed)
New Message:       Pt states he was called and told he needed to have some lab work done, at this time there are no orders for him to get any labs. If you can put the order in I will be happy to call the pt and schedule.

## 2018-09-08 ENCOUNTER — Other Ambulatory Visit: Payer: BLUE CROSS/BLUE SHIELD

## 2018-09-15 ENCOUNTER — Other Ambulatory Visit: Payer: Self-pay

## 2018-09-15 MED ORDER — TICAGRELOR 90 MG PO TABS: 90.0000 mg | ORAL_TABLET | Freq: Two times a day (BID) | ORAL | 11 refills | Status: DC

## 2018-11-01 ENCOUNTER — Telehealth: Payer: Self-pay | Admitting: Interventional Cardiology

## 2018-11-01 NOTE — Telephone Encounter (Signed)
[facility] 11/01/18  LM

## 2020-05-02 ENCOUNTER — Other Ambulatory Visit: Payer: Self-pay

## 2020-05-02 ENCOUNTER — Encounter (HOSPITAL_COMMUNITY): Payer: Self-pay | Admitting: *Deleted

## 2020-05-02 ENCOUNTER — Emergency Department (HOSPITAL_COMMUNITY): Payer: 59

## 2020-05-02 ENCOUNTER — Emergency Department (HOSPITAL_COMMUNITY)
Admission: EM | Admit: 2020-05-02 | Discharge: 2020-05-03 | Disposition: A | Discharge status: Home / Self Care | Payer: 59 | Attending: Emergency Medicine | Admitting: Emergency Medicine

## 2020-05-02 DIAGNOSIS — Z794 Long term (current) use of insulin: Secondary | ICD-10-CM | POA: Insufficient documentation

## 2020-05-02 DIAGNOSIS — Z79899 Other long term (current) drug therapy: Secondary | ICD-10-CM | POA: Diagnosis not present

## 2020-05-02 DIAGNOSIS — E1165 Type 2 diabetes mellitus with hyperglycemia: Secondary | ICD-10-CM | POA: Insufficient documentation

## 2020-05-02 DIAGNOSIS — Z7982 Long term (current) use of aspirin: Secondary | ICD-10-CM | POA: Diagnosis not present

## 2020-05-02 DIAGNOSIS — Z86718 Personal history of other venous thrombosis and embolism: Secondary | ICD-10-CM | POA: Insufficient documentation

## 2020-05-02 DIAGNOSIS — I2510 Atherosclerotic heart disease of native coronary artery without angina pectoris: Secondary | ICD-10-CM | POA: Insufficient documentation

## 2020-05-02 DIAGNOSIS — Z87891 Personal history of nicotine dependence: Secondary | ICD-10-CM | POA: Diagnosis not present

## 2020-05-02 DIAGNOSIS — R110 Nausea: Secondary | ICD-10-CM | POA: Diagnosis present

## 2020-05-02 DIAGNOSIS — R197 Diarrhea, unspecified: Secondary | ICD-10-CM | POA: Diagnosis not present

## 2020-05-02 DIAGNOSIS — I10 Essential (primary) hypertension: Secondary | ICD-10-CM | POA: Diagnosis not present

## 2020-05-02 DIAGNOSIS — R739 Hyperglycemia, unspecified: Secondary | ICD-10-CM

## 2020-05-02 DIAGNOSIS — I252 Old myocardial infarction: Secondary | ICD-10-CM | POA: Insufficient documentation

## 2020-05-02 HISTORY — DX: Acute myocardial infarction, unspecified: I21.9

## 2020-05-02 LAB — CBC
HCT: 42.6 % (ref 39.0–52.0)
Hemoglobin: 14.1 g/dL (ref 13.0–17.0)
MCH: 29.7 pg (ref 26.0–34.0)
MCHC: 33.1 g/dL (ref 30.0–36.0)
MCV: 89.9 fL (ref 80.0–100.0)
Platelets: 210 10*3/uL (ref 150–400)
RBC: 4.74 MIL/uL (ref 4.22–5.81)
RDW: 13.5 % (ref 11.5–15.5)
WBC: 7.4 10*3/uL (ref 4.0–10.5)
nRBC: 0 % (ref 0.0–0.2)

## 2020-05-02 LAB — BASIC METABOLIC PANEL
Anion gap: 11 (ref 5–15)
BUN: 13 mg/dL (ref 6–20)
CO2: 27 mmol/L (ref 22–32)
Calcium: 9.3 mg/dL (ref 8.9–10.3)
Chloride: 99 mmol/L (ref 98–111)
Creatinine, Ser: 1.45 mg/dL — ABNORMAL HIGH (ref 0.61–1.24)
GFR calc Af Amer: >60 mL/min (ref >60)
GFR calc non Af Amer: 54 mL/min — ABNORMAL LOW (ref >60)
Glucose, Bld: 359 mg/dL — ABNORMAL HIGH (ref 70–99)
Potassium: 4.3 mmol/L (ref 3.5–5.1)
Sodium: 137 mmol/L (ref 135–145)

## 2020-05-02 LAB — TROPONIN I (HIGH SENSITIVITY)
Troponin I (High Sensitivity): 7 ng/L (ref <18)
Troponin I (High Sensitivity): 7 ng/L (ref <18)

## 2020-05-02 MED ORDER — SODIUM CHLORIDE 0.9% FLUSH: 3.0000 mL | Freq: Once | INTRAVENOUS | Status: DC

## 2020-05-02 NOTE — ED Triage Notes (Signed)
Pt says that he became very sweaty while at work and having a lot of nausea. He denies pain, but says this is similar to his first heart attack.

## 2020-05-02 NOTE — ED Triage Notes (Signed)
Pt arrives from work via [employer], call out was for c/o chest pain. On arrival  to scene, pt was having aggressive nausea, cold sweats, NO pain. Hx of MI and stents previously, feels similar. 364cbg, 146/110, hr 90-100 NSR. IV established in the left hand. 98% RA.

## 2020-05-03 ENCOUNTER — Encounter (HOSPITAL_COMMUNITY): Payer: Self-pay | Admitting: Emergency Medicine

## 2020-05-03 LAB — CBG MONITORING, ED
Glucose-Capillary: 233 mg/dL — ABNORMAL HIGH (ref 70–99)
Glucose-Capillary: 265 mg/dL — ABNORMAL HIGH (ref 70–99)

## 2020-05-03 MED ORDER — SODIUM CHLORIDE 0.9 % IV BOLUS
500.0000 mL | Freq: Once | INTRAVENOUS | Status: AC
  Administered 2020-05-03: 500 mL via INTRAVENOUS

## 2020-05-03 NOTE — ED Provider Notes (Signed)
[facility] EMERGENCY DEPARTMENT Provider Note   CSN: [id number] Arrival date & time: 05/02/20  2025     History Chief Complaint  Patient presents with  . Chest Pain    Elijah Watkins is a 54 y.o. male.  The history is provided by the patient.  Diarrhea Quality:  Watery Severity:  Mild Onset quality:  Sudden Number of episodes:  2 Timing:  Rare Progression:  Resolved Relieved by:  Nothing Worsened by:  Nothing Ineffective treatments:  None tried Associated symptoms: diaphoresis   Associated symptoms: no abdominal pain, no arthralgias, no chills, no recent cough, no fever, no headaches, no myalgias, no URI and no vomiting   Associated symptoms comment:  Nausea.  Risk factors: no recent antibiotic use   Patient with DM and CAD presents with diarrhea and diaphoresis and nausea.  No chest pain at all.  No arm nor neck pain.  No SOB.  No exertional symptoms.       Past Medical History:  Diagnosis Date  . Anger    anger issues  . Anxiety   . Arthritis   . CAD (coronary artery disease)   . Dental crowns present   . Diabetes mellitus    IDDM - fasting 70-120s  . GERD (gastroesophageal reflux disease)   . History of concussion   . History of DVT (deep vein thrombosis) 03/2005  . History of peptic ulcer age 76  . Hyperlipidemia   . Hypertension    under control, has been on med. > 7 yrs.  . MI (myocardial infarction) (HCC)   . Myocardial infarction (HCC)    stents  . Nephrolithiasis    states has 3 stones in kidney, but no current problems  . Overweight(278.02)   . Pilonidal cyst 05/2012   area is open, not draining  . Pneumonia    hx  . PONV (postoperative nausea and vomiting)   . S/P coronary artery stent placement 2001, 05/2011  . Seasonal allergies     Patient Active Problem List   Diagnosis Date Noted  . Obesity, Class III, BMI 40-49.9 (morbid obesity) (HCC) 09/02/2018  . S/P angioplasty with stent 08/31/2018  . Dyslipidemia, goal  LDL below 70 08/20/2018  . GERD (gastroesophageal reflux disease) 08/20/2018  . CKD (chronic kidney disease), stage III 08/20/2018  . Anxiety 08/20/2018  . Unstable angina (HCC)   . Gallstones 08/02/2013  . Type II diabetes mellitus with renal manifestations (HCC) 02/24/2012  . Myocardial infarct, old 02/24/2012  . Coronary artery disease 02/24/2012  . Hypertension 02/24/2012  . Gout 02/24/2012  . Obesity (BMI 30-39.9) 02/24/2012  . Depression 02/24/2012  . Pilonidal abscess 02/24/2012  . PAIN IN LIMB 01/01/2011    Past Surgical History:  Procedure Laterality Date  . CARDIAC CATHETERIZATION  10/06/2010  . CARPAL TUNNEL RELEASE Right   . CHOLECYSTECTOMY N/A 08/18/2013   Procedure: LAPAROSCOPIC CHOLECYSTECTOMY WITH INTRAOPERATIVE CHOLANGIOGRAM;  Surgeon: Robyne Askew, MD;  Location: [facility] OR;  Service: General;  Laterality: N/A;  . CHONDROPLASTY  08/04/2004   right knee  . CORONARY ANGIOPLASTY WITH STENT PLACEMENT     x 2; last time 05/2011  . CORONARY STENT INTERVENTION N/A 08/22/2018   Procedure: CORONARY STENT INTERVENTION;  Surgeon: Corky Crafts, MD;  Location: [facility] INVASIVE CV LAB;  Service: Cardiovascular;  Laterality: N/A;  . DISTAL BICEPS TENDON REPAIR Left 02/19/2016   Procedure: DISTAL BICEPS TENDON REPAIR;  Surgeon: Jodi Geralds, MD;  Location: [facility];  Service:  Orthopedics;  Laterality: Left;  . [place]   x 3  . knee arthroscopic     left knee  . KNEE CARTILAGE SURGERY  10/2003, 11/2003   Also had bursa removed  . LEFT HEART CATH AND CORONARY ANGIOGRAPHY N/A 08/22/2018   Procedure: LEFT HEART CATH AND CORONARY ANGIOGRAPHY;  Surgeon: Jettie Booze, MD;  Location: [place] CV LAB;  Service: Cardiovascular;  Laterality: N/A;  . PILONIDAL CYST DRAINAGE  05/19/2012   Procedure: IRRIGATION AND DEBRIDEMENT PILONIDAL CYST;  Surgeon: Merrie Roof, MD;  Location: [facility];  Service: General;  Laterality: N/A;   I & D Pilonidal abscess, placement of acell  . PREPATELLAR BURSA EXCISION  12/05/2004   right  . WRIST SURGERY  02/1997   right       Family History  Problem Relation Age of Onset  . Diabetes Mother 30  . Hypertension Father   . Alcohol abuse Father 103    Social History   Tobacco Use  . Smoking status: Former Smoker    Quit date: 02/24/1984    Years since quitting: 36.2  . Smokeless tobacco: Never Used  Substance Use Topics  . Alcohol use: Yes    Comment: rare  . Drug use: No    Home Medications Prior to Admission medications   Medication Sig Start Date End Date Taking? Authorizing Provider  allopurinol (ZYLOPRIM) 300 MG tablet Take 300 mg by mouth daily.    [provider]  ALPRAZolam Duanne Moron) 1 MG tablet Take 1-2 mg by mouth 3 (three) times daily as needed for anxiety.     [provider]  aspirin 81 MG chewable tablet Chew 1 tablet (81 mg total) by mouth daily. 08/24/18   Hongalgi, Lenis Dickinson, MD  atorvastatin (LIPITOR) 80 MG tablet Take 80 mg by mouth daily.    [provider]  gabapentin (NEURONTIN) 600 MG tablet Take 600 mg by mouth 3 (three) times daily.     [provider]  HYDROcodone-acetaminophen (NORCO/VICODIN) 5-325 MG tablet Take 1 tablet by mouth every 6 (six) hours as needed for moderate pain.    [provider]  Insulin Degludec 200 UNIT/ML SOPN Inject into the skin as directed. 09/01/18   [provider]  insulin NPH-regular Human (NOVOLIN 70/30) (70-30) 100 UNIT/ML injection Inject 90 Units into the skin 2 (two) times daily with a meal.    [provider]  lisinopril (PRINIVIL,ZESTRIL) 5 MG tablet Take 5 mg by mouth daily.    [provider]  metoprolol (LOPRESSOR) 50 MG tablet Take 50 mg by mouth 2 (two) times daily.    [provider]  nitroGLYCERIN (NITROSTAT) 0.4 MG SL tablet Place 0.4 mg under the tongue every 5 (five) minutes as needed. x3 doses as needed for chest pain     [provider]  omeprazole (PRILOSEC) 20 MG capsule Take 20 mg by mouth 2 (two) times daily before a meal.    [provider]  Semaglutide,0.25 or 0.5MG /DOS, (OZEMPIC, 0.25 OR 0.5 MG/DOSE,) 2 MG/1.5ML SOPN Inject into the skin as directed.    [provider]  ticagrelor (BRILINTA) 90 MG TABS tablet Take 1 tablet (90 mg total) by mouth 2 (two) times daily. 09/15/18   Nahser, Wonda Cheng, MD  zolpidem (AMBIEN) 10 MG tablet Take 10 mg by mouth at bedtime.     [provider]    Allergies    Latex  Review of  Systems   Review of Systems  Constitutional: Positive for diaphoresis. Negative for chills and fever.  HENT: Negative for congestion.   Eyes: Negative for visual disturbance.  Respiratory: Negative for cough and shortness of breath.   Cardiovascular: Negative for chest pain, palpitations and leg swelling.  Gastrointestinal: Positive for diarrhea and nausea. Negative for abdominal pain and vomiting.  Genitourinary: Negative for dysuria.  Musculoskeletal: Negative for arthralgias, back pain and myalgias.  Skin: Negative for rash.  Neurological: Negative for headaches.  All other systems reviewed and are negative.   Physical Exam Updated Vital Signs BP (!) 164/109 (BP Location: Right Arm)   Pulse 78   Temp 98.9 F (37.2 C) (Oral)   Resp 18   SpO2 100%   Physical Exam Vitals reviewed.  Constitutional:      General: He is not in acute distress.    Appearance: Normal appearance. He is not diaphoretic.  HENT:     Head: Normocephalic and atraumatic.     Nose: Nose normal.  Eyes:     Pupils: Pupils are equal, round, and reactive to light.  Cardiovascular:     Rate and Rhythm: Normal rate and regular rhythm.     Pulses: Normal pulses.     Heart sounds: Normal heart sounds.  Pulmonary:     Effort: Pulmonary effort is normal.     Breath sounds: Normal breath sounds.  Abdominal:     General: Abdomen is flat. Bowel sounds are normal.      Tenderness: There is no abdominal tenderness. There is no guarding.  Musculoskeletal:        General: No tenderness. Normal range of motion.     Cervical back: Normal range of motion and neck supple.     Right lower leg: No edema.     Left lower leg: No edema.  Skin:    General: Skin is warm and dry.     Capillary Refill: Capillary refill takes less than 2 seconds.  Neurological:     General: No focal deficit present.     Mental Status: He is alert and oriented to person, place, and time.     Deep Tendon Reflexes: Reflexes normal.  Psychiatric:        Mood and Affect: Mood is anxious.     ED Results / Procedures / Treatments   Labs (all labs ordered are listed, but only abnormal results are displayed) Results for orders placed or performed during the hospital encounter of 05/02/20  Basic metabolic panel  Result Value Ref Range   Sodium 137 135 - 145 mmol/L   Potassium 4.3 3.5 - 5.1 mmol/L   Chloride 99 98 - 111 mmol/L   CO2 27 22 - 32 mmol/L   Glucose, Bld 359 (H) 70 - 99 mg/dL   BUN 13 6 - 20 mg/dL   Creatinine, Ser 1.49 (H) 0.61 - 1.24 mg/dL   Calcium 9.3 8.9 - 70.2 mg/dL   GFR calc non Af Amer 54 (L) >60 mL/min   GFR calc Af Amer >60 >60 mL/min   Anion gap 11 5 - 15  CBC  Result Value Ref Range   WBC 7.4 4.0 - 10.5 K/uL   RBC 4.74 4.22 - 5.81 MIL/uL   Hemoglobin 14.1 13.0 - 17.0 g/dL   HCT 63.7 85.8 - 85.0 %   MCV 89.9 80.0 - 100.0 fL   MCH 29.7 26.0 - 34.0 pg   MCHC 33.1 30.0 - 36.0 g/dL   RDW 27.7 41.2 -  15.5 %   Platelets 210 150 - 400 K/uL   nRBC 0.0 0.0 - 0.2 %  CBG monitoring, ED  Result Value Ref Range   Glucose-Capillary 265 (H) 70 - 99 mg/dL  Troponin I (High Sensitivity)  Result Value Ref Range   Troponin I (High Sensitivity) 7 <18 ng/L  Troponin I (High Sensitivity)  Result Value Ref Range   Troponin I (High Sensitivity) 7 <18 ng/L   DG Chest 2 View  Result Date: 05/02/2020 CLINICAL DATA:  Chest pain. Nausea. EXAM: CHEST - 2 VIEW COMPARISON:   Radiograph 08/19/2018 FINDINGS: The cardiomediastinal contours are normal. The lungs are clear. Pulmonary vasculature is normal. No consolidation, pleural effusion, or pneumothorax. No acute osseous abnormalities are seen. IMPRESSION: Negative radiographs of the chest. Electronically Signed   By: Narda Rutherford M.D.   On: 05/02/2020 21:03    EKG EKG Interpretation  Date/Time:  Thursday May 02 2020 20:27:07 EDT Ventricular Rate:  89 PR Interval:  152 QRS Duration: 90 QT Interval:  382 QTC Calculation: 464 R Axis:   80 Text Interpretation: Normal sinus rhythm Confirmed by Nicanor Alcon, Kevionna Heffler ([id number]) on 05/03/2020 12:22:25 AM   Radiology DG Chest 2 View  Result Date: 05/02/2020 CLINICAL DATA:  Chest pain. Nausea. EXAM: CHEST - 2 VIEW COMPARISON:  Radiograph 08/19/2018 FINDINGS: The cardiomediastinal contours are normal. The lungs are clear. Pulmonary vasculature is normal. No consolidation, pleural effusion, or pneumothorax. No acute osseous abnormalities are seen. IMPRESSION: Negative radiographs of the chest. Electronically Signed   By: Narda Rutherford M.D.   On: 05/02/2020 21:03    Procedures Procedures (including critical care time)  Medications Ordered in ED Medications  sodium chloride flush (NS) 0.9 % injection 3 mL (has no administration in time range)    ED Course  I have reviewed the triage vital signs and the nursing notes.  Pertinent labs & imaging results that were available during my care of the patient were reviewed by me and considered in my medical decision making (see chart for details).  Ruled out for MI in the ED.  No acute EKG changes.  I suspect this is from the diarrhea and nausea and there was component of anxiety.  I do not believe this is a PE.  There is no abdominal pain nor tenderness and I do not believe the patient needs a CT at this time.    Elijah Watkins was evaluated in Emergency Department on 05/03/2020 for the symptoms described in the history of  present illness. He was evaluated in the context of the global COVID-19 pandemic, which necessitated consideration that the patient might be at risk for infection with the SARS-CoV-2 virus that causes COVID-19. Institutional protocols and algorithms that pertain to the evaluation of patients at risk for COVID-19 are in a state of rapid change based on information released by regulatory bodies including the CDC and federal and state organizations. These policies and algorithms were followed during the patient's care in the ED.  Final Clinical Impression(s) / ED Diagnoses Return for intractable cough, coughing up blood,fevers >100.4 unrelieved by medication, shortness of breath, intractable vomiting, chest pain, shortness of breath, weakness,numbness, changes in speech, facial asymmetry,abdominal pain, passing out,Inability to tolerate liquids or food, cough, altered mental status or any concerns. No signs of systemic illness or infection. The patient is nontoxic-appearing on exam and vital signs are within normal limits.   I have reviewed the triage vital signs and the nursing notes. Pertinent labs &imaging results that were available  during my care of the patient were reviewed by me and considered in my medical decision making (see chart for details).After history, exam, and medical workup I feel the patient has beenappropriately medically screened and is safe for discharge home. Pertinent diagnoses were discussed with the patient. Patient was given return precautions.   Billy Rocco, MD 05/03/20 0202

## 2020-08-04 ENCOUNTER — Emergency Department (HOSPITAL_BASED_OUTPATIENT_CLINIC_OR_DEPARTMENT_OTHER): Admission: EM | Admit: 2020-08-04 | Discharge: 2020-08-04 | Discharge status: Absent without leave | Payer: 59

## 2020-11-14 ENCOUNTER — Emergency Department (HOSPITAL_COMMUNITY)
Admission: EM | Admit: 2020-11-14 | Discharge: 2020-11-14 | Disposition: A | Discharge status: Home / Self Care | Payer: BLUE CROSS/BLUE SHIELD | Attending: Emergency Medicine | Admitting: Emergency Medicine

## 2020-11-14 ENCOUNTER — Encounter (HOSPITAL_COMMUNITY): Payer: Self-pay | Admitting: Emergency Medicine

## 2020-11-14 DIAGNOSIS — M542 Cervicalgia: Secondary | ICD-10-CM | POA: Diagnosis not present

## 2020-11-14 DIAGNOSIS — Z5321 Procedure and treatment not carried out due to patient leaving prior to being seen by health care provider: Secondary | ICD-10-CM | POA: Insufficient documentation

## 2020-11-14 DIAGNOSIS — R519 Headache, unspecified: Secondary | ICD-10-CM | POA: Insufficient documentation
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
## 2021-07-03 ENCOUNTER — Emergency Department (HOSPITAL_COMMUNITY): Payer: Self-pay

## 2021-07-03 ENCOUNTER — Other Ambulatory Visit: Payer: Self-pay

## 2021-07-03 ENCOUNTER — Observation Stay (HOSPITAL_COMMUNITY): Payer: Self-pay

## 2021-07-03 ENCOUNTER — Observation Stay (HOSPITAL_BASED_OUTPATIENT_CLINIC_OR_DEPARTMENT_OTHER): Payer: Self-pay

## 2021-07-03 ENCOUNTER — Observation Stay (HOSPITAL_COMMUNITY)
Admission: EM | Admit: 2021-07-03 | Discharge: 2021-07-03 | Disposition: A | Discharge status: Home / Self Care | Payer: Self-pay | Attending: Family Medicine | Admitting: Family Medicine

## 2021-07-03 DIAGNOSIS — I129 Hypertensive chronic kidney disease with stage 1 through stage 4 chronic kidney disease, or unspecified chronic kidney disease: Secondary | ICD-10-CM | POA: Insufficient documentation

## 2021-07-03 DIAGNOSIS — G459 Transient cerebral ischemic attack, unspecified: Secondary | ICD-10-CM

## 2021-07-03 DIAGNOSIS — Z20822 Contact with and (suspected) exposure to covid-19: Secondary | ICD-10-CM | POA: Insufficient documentation

## 2021-07-03 DIAGNOSIS — N183 Chronic kidney disease, stage 3 unspecified: Secondary | ICD-10-CM | POA: Insufficient documentation

## 2021-07-03 DIAGNOSIS — E1122 Type 2 diabetes mellitus with diabetic chronic kidney disease: Secondary | ICD-10-CM | POA: Insufficient documentation

## 2021-07-03 DIAGNOSIS — Z87891 Personal history of nicotine dependence: Secondary | ICD-10-CM | POA: Insufficient documentation

## 2021-07-03 DIAGNOSIS — I2510 Atherosclerotic heart disease of native coronary artery without angina pectoris: Secondary | ICD-10-CM | POA: Insufficient documentation

## 2021-07-03 DIAGNOSIS — Z79899 Other long term (current) drug therapy: Secondary | ICD-10-CM | POA: Insufficient documentation

## 2021-07-03 DIAGNOSIS — Z7982 Long term (current) use of aspirin: Secondary | ICD-10-CM | POA: Insufficient documentation

## 2021-07-03 DIAGNOSIS — Z794 Long term (current) use of insulin: Secondary | ICD-10-CM | POA: Insufficient documentation

## 2021-07-03 DIAGNOSIS — Z91040 Latex allergy status: Secondary | ICD-10-CM | POA: Insufficient documentation

## 2021-07-03 LAB — CBC
HCT: 40.4 % (ref 39.0–52.0)
Hemoglobin: 13.9 g/dL (ref 13.0–17.0)
MCH: 32.3 pg (ref 26.0–34.0)
MCHC: 34.4 g/dL (ref 30.0–36.0)
MCV: 94 fL (ref 80.0–100.0)
Platelets: 162 10*3/uL (ref 150–400)
RBC: 4.3 MIL/uL (ref 4.22–5.81)
RDW: 12.9 % (ref 11.5–15.5)
WBC: 8.5 10*3/uL (ref 4.0–10.5)
nRBC: 0 % (ref 0.0–0.2)

## 2021-07-03 LAB — DIFFERENTIAL
Abs Immature Granulocytes: 0.07 10*3/uL (ref 0.00–0.07)
Basophils Absolute: 0 10*3/uL (ref 0.0–0.1)
Basophils Relative: 1 %
Eosinophils Absolute: 0.1 10*3/uL (ref 0.0–0.5)
Eosinophils Relative: 2 %
Immature Granulocytes: 1 %
Lymphocytes Relative: 27 %
Lymphs Abs: 2.3 10*3/uL (ref 0.7–4.0)
Monocytes Absolute: 0.9 10*3/uL (ref 0.1–1.0)
Monocytes Relative: 10 %
Neutro Abs: 5.1 10*3/uL (ref 1.7–7.7)
Neutrophils Relative %: 59 %

## 2021-07-03 LAB — COMPREHENSIVE METABOLIC PANEL
ALT: 47 U/L — ABNORMAL HIGH (ref 0–44)
AST: 39 U/L (ref 15–41)
Albumin: 3.4 g/dL — ABNORMAL LOW (ref 3.5–5.0)
Alkaline Phosphatase: 54 U/L (ref 38–126)
Anion gap: 12 (ref 5–15)
BUN: 13 mg/dL (ref 6–20)
CO2: 24 mmol/L (ref 22–32)
Calcium: 8.5 mg/dL — ABNORMAL LOW (ref 8.9–10.3)
Chloride: 100 mmol/L (ref 98–111)
Creatinine, Ser: 1.46 mg/dL — ABNORMAL HIGH (ref 0.61–1.24)
GFR, Estimated: 56 mL/min — ABNORMAL LOW (ref >60)
Glucose, Bld: 110 mg/dL — ABNORMAL HIGH (ref 70–99)
Potassium: 3.6 mmol/L (ref 3.5–5.1)
Sodium: 136 mmol/L (ref 135–145)
Total Bilirubin: 0.4 mg/dL (ref 0.3–1.2)
Total Protein: 6.2 g/dL — ABNORMAL LOW (ref 6.5–8.1)

## 2021-07-03 LAB — ECHOCARDIOGRAM COMPLETE
Area-P 1/2: 3.85 cm2
Calc EF: 49.4 %
S' Lateral: 4.1 cm
Single Plane A2C EF: 44.2 %
Single Plane A4C EF: 53.9 %

## 2021-07-03 LAB — PROTIME-INR
INR: 1 (ref 0.8–1.2)
Prothrombin Time: 13 seconds (ref 11.4–15.2)

## 2021-07-03 LAB — SARS CORONAVIRUS 2 (TAT 6-24 HRS): SARS Coronavirus 2: NEGATIVE

## 2021-07-03 LAB — CBG MONITORING, ED
Glucose-Capillary: 127 mg/dL — ABNORMAL HIGH (ref 70–99)
Glucose-Capillary: 209 mg/dL — ABNORMAL HIGH (ref 70–99)

## 2021-07-03 LAB — I-STAT CHEM 8, ED
BUN: 14 mg/dL (ref 6–20)
Calcium, Ion: 0.98 mmol/L — ABNORMAL LOW (ref 1.15–1.40)
Chloride: 101 mmol/L (ref 98–111)
Creatinine, Ser: 1.6 mg/dL — ABNORMAL HIGH (ref 0.61–1.24)
Glucose, Bld: 112 mg/dL — ABNORMAL HIGH (ref 70–99)
HCT: 41 % (ref 39.0–52.0)
Hemoglobin: 13.9 g/dL (ref 13.0–17.0)
Potassium: 3.6 mmol/L (ref 3.5–5.1)
Sodium: 137 mmol/L (ref 135–145)
TCO2: 27 mmol/L (ref 22–32)

## 2021-07-03 LAB — APTT: aPTT: 28 seconds (ref 24–36)

## 2021-07-03 LAB — HIV ANTIBODY (ROUTINE TESTING W REFLEX): HIV Screen 4th Generation wRfx: NONREACTIVE

## 2021-07-03 MED ORDER — ASPIRIN 300 MG RE SUPP: 300.0000 mg | Freq: Every day | RECTAL | Status: DC

## 2021-07-03 MED ORDER — ACETAMINOPHEN 325 MG PO TABS: 650.0000 mg | ORAL_TABLET | ORAL | Status: DC | PRN

## 2021-07-03 MED ORDER — GABAPENTIN 300 MG PO CAPS
600.0000 mg | ORAL_CAPSULE | Freq: Two times a day (BID) | ORAL | Status: DC
  Administered 2021-07-03: 600 mg via ORAL
  Filled 2021-07-03: qty 2

## 2021-07-03 MED ORDER — CLOPIDOGREL BISULFATE 75 MG PO TABS
75.0000 mg | ORAL_TABLET | Freq: Every day | ORAL | Status: DC
  Administered 2021-07-03: 75 mg via ORAL
  Filled 2021-07-03: qty 1

## 2021-07-03 MED ORDER — SODIUM CHLORIDE 0.9% FLUSH: 3.0000 mL | Freq: Once | INTRAVENOUS | Status: DC

## 2021-07-03 MED ORDER — PANTOPRAZOLE SODIUM 40 MG PO TBEC
40.0000 mg | DELAYED_RELEASE_TABLET | Freq: Every day | ORAL | Status: DC
  Administered 2021-07-03: 40 mg via ORAL
  Filled 2021-07-03: qty 1

## 2021-07-03 MED ORDER — SODIUM CHLORIDE 0.9 % IV SOLN: Freq: Once | INTRAVENOUS | Status: AC

## 2021-07-03 MED ORDER — METOPROLOL TARTRATE 25 MG PO TABS
50.0000 mg | ORAL_TABLET | Freq: Two times a day (BID) | ORAL | Status: DC
  Administered 2021-07-03: 50 mg via ORAL
  Filled 2021-07-03: qty 2

## 2021-07-03 MED ORDER — INSULIN ASPART PROT & ASPART (70-30 MIX) 100 UNIT/ML ~~LOC~~ SUSP: 80.0000 [IU] | Freq: Two times a day (BID) | SUBCUTANEOUS | Status: DC

## 2021-07-03 MED ORDER — ASPIRIN 81 MG PO CHEW: 81.0000 mg | CHEWABLE_TABLET | Freq: Every day | ORAL | 0 refills | Status: AC

## 2021-07-03 MED ORDER — ALLOPURINOL 100 MG PO TABS
300.0000 mg | ORAL_TABLET | Freq: Every day | ORAL | Status: DC
  Administered 2021-07-03: 300 mg via ORAL
  Filled 2021-07-03: qty 3

## 2021-07-03 MED ORDER — ATORVASTATIN CALCIUM 40 MG PO TABS
80.0000 mg | ORAL_TABLET | Freq: Every day | ORAL | Status: DC
  Administered 2021-07-03: 80 mg via ORAL
  Filled 2021-07-03: qty 2

## 2021-07-03 MED ORDER — CLOPIDOGREL BISULFATE 75 MG PO TABS: 75.0000 mg | ORAL_TABLET | Freq: Every day | ORAL | 11 refills | Status: AC

## 2021-07-03 MED ORDER — PROCHLORPERAZINE EDISYLATE 10 MG/2ML IJ SOLN
10.0000 mg | Freq: Once | INTRAMUSCULAR | Status: AC
  Administered 2021-07-03: 10 mg via INTRAVENOUS
  Filled 2021-07-03: qty 2

## 2021-07-03 MED ORDER — ALPRAZOLAM 0.25 MG PO TABS: 1.0000 mg | ORAL_TABLET | Freq: Two times a day (BID) | ORAL | Status: DC | PRN

## 2021-07-03 MED ORDER — ASPIRIN 81 MG PO CHEW
81.0000 mg | CHEWABLE_TABLET | Freq: Every day | ORAL | Status: DC
  Administered 2021-07-03: 81 mg via ORAL
  Filled 2021-07-03: qty 1

## 2021-07-03 MED ORDER — ACETAMINOPHEN 650 MG RE SUPP: 650.0000 mg | RECTAL | Status: DC | PRN

## 2021-07-03 MED ORDER — STROKE: EARLY STAGES OF RECOVERY BOOK: Freq: Once | Status: DC

## 2021-07-03 MED ORDER — ACETAMINOPHEN 160 MG/5ML PO SOLN: 650.0000 mg | ORAL | Status: DC | PRN

## 2021-07-03 MED ORDER — PERFLUTREN LIPID MICROSPHERE
1.0000 mL | INTRAVENOUS | Status: DC | PRN
  Administered 2021-07-03: 2 mL via INTRAVENOUS
  Filled 2021-07-03: qty 10

## 2021-07-03 MED ORDER — NITROGLYCERIN 0.4 MG SL SUBL: 0.4000 mg | SUBLINGUAL_TABLET | SUBLINGUAL | Status: DC | PRN

## 2021-07-03 NOTE — H&P (Signed)
History and Physical    Elijah GanserWilliam C Hagberg ZOX:[id number] DOB: 01-10-1966 DOA: 07/03/2021  PCP: No primary care provider on file.  Patient coming from: home  I have personally briefly reviewed patient's old medical records in [facility]  Chief Complaint: stroke like symptoms  HPI: Elijah Watkins is a 55 y.o. male with medical history significant of DM2, HTN, HLD, MI, GERD, CAD s/p MI s/p stents,obesity,anxiety, who presents to ED BIB EMS as code stroke. Per patient at around 0230 am he noted b/l lower extremity weakness followed by left arm and left leg numbness and tingling s/p getting up to use the bathroom. He attempted to alert his wife but found it difficult to get his words out. His symptoms persisted and due to this EMS was called. Per patient symptoms lasted arund 30 minutes . Of note in the filed EMS note that patient had  non-focal neuro exam.  Patient states that is symptoms have not returned and he feels back to baseline. He denies prior episodes like in the past. Patient notes no associated n/v/d/abdominal pain, sob/ chest pain , palpitations or presyncope.   ED Course:  Vitals:afeb, bp 172/117, repeat 127/76 w/o treatment, hr 73, sat 96% on ra  Labs:  Wbc 8.5, hgb 13.9, plt162,inr 1 Na 137, cr 1.6 up from 1.45, ionized calcium 0.98 CT head 1. No acute finding. 2. 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 3. Right sinusitis from middle meatus obstruction.  MRI/MRA/carotid pending  Patient seen by neuro who recommend DAPT and further CVA work up  Review of Systems: As per HPI otherwise 10 point review of systems negative.   Past Medical History:  Diagnosis Date   Anger    anger issues   Anxiety    Arthritis    CAD (coronary artery disease)    Dental crowns present    Diabetes mellitus    IDDM - fasting 70-120s   GERD (gastroesophageal reflux disease)    History of concussion    History of DVT (deep vein thrombosis)  03/2005   History of peptic ulcer age 55   Hyperlipidemia    Hypertension    under control, has been on med. > 7 yrs.   MI (myocardial infarction) (HCC)    Myocardial infarction (HCC)    stents   Nephrolithiasis    states has 3 stones in kidney, but no current problems   Overweight(278.02)    Pilonidal cyst 05/2012   area is open, not draining   Pneumonia    hx   PONV (postoperative nausea and vomiting)    S/P coronary artery stent placement 2001, 05/2011   Seasonal allergies     Past Surgical History:  Procedure Laterality Date   CARDIAC CATHETERIZATION  10/06/2010   CARPAL TUNNEL RELEASE Right    CHOLECYSTECTOMY N/A 08/18/2013   Procedure: LAPAROSCOPIC CHOLECYSTECTOMY WITH INTRAOPERATIVE CHOLANGIOGRAM;  Surgeon: Robyne AskewPaul S Toth III, MD;  Location: [facility] OR;  Service: General;  Laterality: N/A;   CHONDROPLASTY  08/04/2004   right knee   CORONARY ANGIOPLASTY WITH STENT PLACEMENT     x 2; last time 05/2011   CORONARY STENT INTERVENTION N/A 08/22/2018   Procedure: CORONARY STENT INTERVENTION;  Surgeon: Corky CraftsVaranasi, Jayadeep S, MD;  Location: [facility] INVASIVE CV LAB;  Service: Cardiovascular;  Laterality: N/A;   DISTAL BICEPS TENDON REPAIR Left 02/19/2016   Procedure: DISTAL BICEPS TENDON REPAIR;  Surgeon: Jodi GeraldsJohn Graves, MD;  Location: [facility];  Service: Orthopedics;  Laterality: Left;   KIDNEY STONE SURGERY  1988 - 1998   x 3   knee arthroscopic     left knee   KNEE CARTILAGE SURGERY  10/2003, 11/2003   Also had bursa removed   LEFT HEART CATH AND CORONARY ANGIOGRAPHY N/A 08/22/2018   Procedure: LEFT HEART CATH AND CORONARY ANGIOGRAPHY;  Surgeon: Corky Crafts, MD;  Location: [facility] INVASIVE CV LAB;  Service: Cardiovascular;  Laterality: N/A;   PILONIDAL CYST DRAINAGE  05/19/2012   Procedure: IRRIGATION AND DEBRIDEMENT PILONIDAL CYST;  Surgeon: Robyne Askew, MD;  Location: [facility];  Service: General;  Laterality: N/A;  I & D Pilonidal abscess, placement of acell    PREPATELLAR BURSA EXCISION  12/05/2004   right   WRIST SURGERY  02/1997   right     reports that he quit smoking about 37 years ago. He has never used smokeless tobacco. He reports current alcohol use. He reports that he does not use drugs.  Allergies  Allergen Reactions   Latex Hives, Swelling and Other (See Comments)    Dry cough    Family History  Problem Relation Age of Onset   Diabetes Mother 53   Hypertension Father    Alcohol abuse Father 73    Prior to Admission medications   Medication Sig Start Date End Date Taking? Authorizing Provider  allopurinol (ZYLOPRIM) 300 MG tablet Take 300 mg by mouth daily.   Yes [provider]  ALPRAZolam Prudy Feeler) 1 MG tablet Take 1-2 mg by mouth 2 (two) times daily as needed for anxiety.   Yes [provider]  aspirin 81 MG chewable tablet Chew 1 tablet (81 mg total) by mouth daily. 08/24/18  Yes Hongalgi, Maximino Greenland, MD  atorvastatin (LIPITOR) 80 MG tablet Take 80 mg by mouth daily.   Yes [provider]  gabapentin (NEURONTIN) 600 MG tablet Take 600 mg by mouth 2 (two) times daily.   Yes [provider]  insulin NPH-regular Human (NOVOLIN 70/30) (70-30) 100 UNIT/ML injection Inject 80 Units into the skin 2 (two) times daily with a meal.   Yes [provider]  lisinopril (PRINIVIL,ZESTRIL) 5 MG tablet Take 5 mg by mouth daily.   Yes [provider]  metoprolol (LOPRESSOR) 50 MG tablet Take 50 mg by mouth 2 (two) times daily.   Yes [provider]  nitroGLYCERIN (NITROSTAT) 0.4 MG SL tablet Place 0.4 mg under the tongue every 5 (five) minutes as needed. x3 doses as needed for chest pain   Yes [provider]  omeprazole (PRILOSEC) 20 MG capsule Take 20 mg by mouth 2 (two) times daily before a meal.   Yes [provider]  zolpidem (AMBIEN) 10 MG tablet Take 10 mg by mouth at bedtime.    Yes [provider]    Physical Exam: Vitals:   07/03/21 0530  BP: (!)  172/117  Pulse: 73  SpO2: 96%     Vitals:   07/03/21 0530  BP: (!) 172/117  Pulse: 73  SpO2: 96%  Constitutional: NAD, calm, comfortable Eyes: PERRL, lids and conjunctivae normal ENMT: Mucous membranes are moist. Posterior pharynx clear of any exudate or lesions.Normal dentition.  Neck: normal, supple, no masses, no thyromegaly Respiratory: clear to auscultation bilaterally, no wheezing, no crackles. Normal respiratory effort. No accessory muscle use.  Cardiovascular: Regular rate and rhythm, no murmurs / rubs / gallops. No extremity edema. 2+ pedal pulses. No carotid bruits.  Abdomen: no tenderness, no masses palpated.  No hepatosplenomegaly. Bowel sounds positive.  Musculoskeletal: no clubbing / cyanosis. No joint deformity upper and lower extremities. Good ROM, no contractures. Normal muscle tone.  Skin: no rashes, lesions, ulcers. No induration Neurologic: CN 2-12 grossly intact. Sensation intact, DTR normal. Strength 5/5 in all 4.  Psychiatric: Normal judgment and insight. Alert and oriented x 3. Normal mood.    Labs on Admission: I have personally reviewed following labs and imaging studies  CBC: Recent Labs  Lab 07/03/21 0509 07/03/21 0514  WBC 8.5  --   NEUTROABS 5.1  --   HGB 13.9 13.9  HCT 40.4 41.0  MCV 94.0  --   PLT 162  --    Basic Metabolic Panel: Recent Labs  Lab 07/03/21 0514  NA 137  K 3.6  CL 101  GLUCOSE 112*  BUN 14  CREATININE 1.60*   GFR: CrCl cannot be calculated (Unknown ideal weight.). Liver Function Tests: No results for input(s): AST, ALT, ALKPHOS, BILITOT, PROT, ALBUMIN in the last 168 hours. No results for input(s): LIPASE, AMYLASE in the last 168 hours. No results for input(s): AMMONIA in the last 168 hours. Coagulation Profile: Recent Labs  Lab 07/03/21 0509  INR 1.0   Cardiac Enzymes: No results for input(s): CKTOTAL, CKMB, CKMBINDEX, TROPONINI in the last 168 hours. BNP (last 3 results) No results for input(s): PROBNP in  the last 8760 hours. HbA1C: No results for input(s): HGBA1C in the last 72 hours. CBG: Recent Labs  Lab 07/03/21 0514  GLUCAP 127*   Lipid Profile: No results for input(s): CHOL, HDL, LDLCALC, TRIG, CHOLHDL, LDLDIRECT in the last 72 hours. Thyroid Function Tests: No results for input(s): TSH, T4TOTAL, FREET4, T3FREE, THYROIDAB in the last 72 hours. Anemia Panel: No results for input(s): VITAMINB12, FOLATE, FERRITIN, TIBC, IRON, RETICCTPCT in the last 72 hours. Urine analysis:    Component Value Date/Time   COLORURINE STRAW (A) 06/23/2017 1311   APPEARANCEUR CLEAR 06/23/2017 1311   LABSPEC 1.022 06/23/2017 1311   PHURINE 6.0 06/23/2017 1311   GLUCOSEU >=500 (A) 06/23/2017 1311   HGBUR SMALL (A) 06/23/2017 1311   BILIRUBINUR NEGATIVE 06/23/2017 1311   KETONESUR NEGATIVE 06/23/2017 1311   PROTEINUR 30 (A) 06/23/2017 1311   UROBILINOGEN 0.2 09/19/2013 1843   NITRITE NEGATIVE 06/23/2017 1311   LEUKOCYTESUR NEGATIVE 06/23/2017 1311    Radiological Exams on Admission: CT HEAD CODE STROKE WO CONTRAST  Result Date: 07/03/2021 CLINICAL DATA:  Code stroke. EXAM: CT HEAD WITHOUT CONTRAST TECHNIQUE: Contiguous axial images were obtained from the base of the skull through the vertex without intravenous contrast. COMPARISON:  03/29/2008 FINDINGS: Brain: No evidence of acute infarction, hemorrhage, hydrocephalus, extra-axial collection or mass lesion/mass effect. Chronic appearing lacunar infarcts have occurred in the bilateral putamen since prior. Interval disproportionate subarachnoid spaces with sylvian fissure widening and sulcal effacement at the vertex but no notable ventriculomegaly to implicate communicating hydrocephalus. Vascular: Premature atheromatous calcification Skull: Normal. Negative for fracture or focal lesion. Sinuses/Orbits: Interval right middle meatus obstruction with sinusitis and complete opacification. Other: These results were communicated to Dr. Derry Lory at 5:20 am on  07/03/2021 by text page via the [facility] messaging system. ASPECTS [facility] Stroke Program Early CT Score) Not scored without localizing symptoms IMPRESSION: 1. No acute finding. 2. 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 3. Right sinusitis from middle meatus obstruction. Electronically Signed   By: Marnee Spring M.D.   On: 07/03/2021 05:22    EKG: Independently reviewed. Pending/unable to view at this  time  Assessment/Plan  TIA  -admit to neuro tele unit  -place on tia/cva protocol  -ASA 81 , plavix 75 x21 days , followed by asa 81 dailyper neuro recs  -as well as high dose statin  -echo , lipid/a1c , ekg pending ,MRI head w/o contrast, MRA head w/o contrast , carotid u/s b/l -pt/ot/speech as per protocol -patient currently no complaint back to normal, non-focal neuro exam   DM2 with hyperglycemia -resume long acting insulin -place on in house protocol  CKDIII -cr trending up , hold ace for now  -monitor labs , resume as able    HTN -stable,permissive htn  -resume home regimen with next 48 hours    HLD -continue high dose statin   CADs/p  MI s/p stent  -resume cardiac regimen   GERD -continue ppi    Obesity -nutrition consult   Anxiety -resume home regimen  DVT prophylaxis: scd   Code Status:  Full Family Communication: n/a  DisPo: place under observation  Consults called:neurology :  Admission status: obs   Lurline Del MD [facility]   If 7PM-7AM, please contact night-coverage [email or web address] Password [facility]  07/03/2021, 6:03 AM

## 2021-07-03 NOTE — Progress Notes (Signed)
  Echocardiogram 2D Echocardiogram has been performed.  Elijah Watkins 07/03/2021, 12:36 PM

## 2021-07-03 NOTE — ED Notes (Signed)
Pt transported to CT, RN unable to obtain labs via IV at this time, will attempt when pt returns.

## 2021-07-03 NOTE — Progress Notes (Addendum)
STROKE TEAM PROGRESS NOTE    Interval History   No acute events overnight, spoke with patient at length about his stroke symptoms that occurred the previous day. He was at home and when he got up to go to the bathroom he developed weakness in his legs. Realized that his left leg in particular was weak and his wife had to help him to lift it up.   In addition to left leg weakness, he also had speech difficulty.   Sx resolved yesterday and he has no symptoms today on exam states she he feels back to his baseline.   Neurological exam is non focal and NIHSS is a 0.   Pertinent Lab Work and Imaging    07/03/21 CT Head WO IV Contrast 1. No acute finding. 2. 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 3. Right sinusitis from middle meatus obstruction.  07/03/21 MR Angio Head WO Contrast  No significant stenosis   VAS US Carotid  Right Carotid: Velocities in the right ICA are consistent with a 1-39% stenosis.   Left Carotid: Velocities in the left ICA are consistent with a 1-39% stenosis.   Vertebrals: Right vertebral artery demonstrates antegrade flow. Left vertebral artery was not visualized.  07/03/21 MRI Brain WO IV Contrast 1. No acute intracranial abnormality. 2. Mild chronic small vessel ischemic disease and moderate cerebral atrophy.  07/03/21 Echocardiogram Complete  Results pending   Physical Examination   Constitutional: Calm, appropriate for condition obese middle aged caucasian male Cardiovascular: Normal RR Respiratory: No increased WOB   Mental status: AAOx4, following commands  Speech: Fluent with repetition and naming intact, no dysarthria  Cranial nerves: EOMI, VFF, Tongue midline, Shoulder shrug intact  Motor: Normal bulk and tone. No drift. Strength 5/5 throughout.  Sensory: Intact to light touch throughout  Coordination: FNF and HTS intact  Gait: Deferred   NIHSS: 0  Assessment and Plan   Mr. Elijah Watkins is  a 55 y.o. male w/pmh of  DM2, HTN, HLD, MI, PUD, obesity who presents with left sided numbness involving his arm and leg and speech difficulty, sx resolved upon arriving to the hospital. Did not qualify for IVTPA given NIHSS of 0, no thrombectomy due to lack of signs indicating LVO.   #Right hemispheric TIA likely from small vessel disease Patient presented with the symptoms described above. At this time, his work up is essentially complete aside from echo results that are pending. His left sided weakness and speech difficulty have resolved. MRI Brain was negative for acute stroke. MRA Head non revealing, neck vessel imaging did not show significant stenosis. Echo results are pending. Stroke labs ordered for tomorrow morning for accuracy are pending. Prior hemoglobin A1C in February of this year was 12.0. Symptoms are compelling for a TIA.  - DAPT for 21 days followed by Plavix monotherapy ( was taking ASA for stroke prevention at home thus will transition to Plavix)  - Continue Atorvastatin 80 mg QD for secondary stroke prevention  - Of note, patient elected to enroll in a sleep study, this will require for him to stay overnight. He is aware of this and ok with staying overnight  - Ambulatory referral placed to neurology for stroke follow up   #Hypertension He has a history of HTN and takes Lisinopril 5 mg QD and Lopressor 50 mg BID at home. Currently blood pressure is trending normotensive. Given vessel imaging and MRI Brain are pending, recommend permissive hypertension at this time as  a precaution.  #Stroke Swallow Screening Passed bedside swallow evaluation for a regular diet   #Hyperlipidemia From a stroke prevention stand point, the LDL goal is < 70. LDL pending, agree with statin for now can discontinue or decreased based on LDL when lipid panel results.   #DMII  Hemoglobin A1C this admission is pending. Should result tomorrow AM. Recommend to treat hyperglycemia with SSI while  hospitalized. Outpatient PCP to manage diabetes at discharge.   Hospital day # 0  Stark Jock, NP  [facility] Nurse Practitioner Patient seen and discussed with attending physician Dr. Pearlean Brownie   Stroke MD Note: I have personally obtained history,examined this patient, reviewed notes, independently viewed imaging studies, participated in medical decision making and plan of care.ROS completed by me personally and pertinent positives fully documented  I have made any additions or clarifications directly to the above note. Agree with note above.  He presented with transient speech and left leg difficulties and brain imaging negative for acute stroke.  Continue ongoing stroke work-up and aggressive risk factor modification.  Dual antiplatelet therapy of aspirin and Plavix for 3 weeks followed by Plavix alone.  Patient also appears to be at risk for sleep apnea and was given information to review and decide about possible participation in the sleep smart study.  Discussed with Dr. Mahala Menghini.  Greater than 50% time during this 35-minute visit was spent in counseling and coordination of care and discussion with care team.  Delia Heady, MD Medical Director [facility] Pager: [phone] 07/03/2021 6:01 PM   To contact Stroke Continuity provider, please refer to [email or web address]. After hours, contact General Neurology

## 2021-07-03 NOTE — Consult Note (Signed)
NEUROLOGY CONSULTATION NOTE   Date of service: July 03, 2021 Patient Name: Elijah Watkins MRN:  [id number] DOB:  [birth year] Reason for consult: "Stroke code" Requesting Provider: Nira Connardama, Pedro Eduardo, * _ _ _   _ __   _ __ _ _  __ __   _ __   __ _  History of Present Illness  Elijah GanserWilliam C Vanroekel is a 55 y.o. male with PMH significant for DM2, HTN, HLD, MI, PUD, obesity who presents with episode of left-sided numbness involving his arm and leg and speech difficulty.  Patient woke up at 0230 on 07/03/2021 to use the bathroom.  He was able to get to the bathroom fine and was standing to urinate when he felt bilateral lower extremity weakness and his left arm and left leg went completely numb and tingly.  He got back to the room and reports that he was very wobbly and had to hold onto things.  He got in the bed and woke his wife up and noted that he had difficulty articulating words and his speech was difficult to understand.  He laid in the bed for a while and eventually got up to go to the kitchen.  He had an episode of dizziness/lightheadedness when he was in the kitchen.  He still continued to have speech difficulty and so they eventually called EMS.  By the time EMS arrived, his symptoms are completely resolved.  The episode lasted about 30 minutes.  He reports that on the way to the hospital in the ambulance, he developed headache in the occiput along with some blurred vision.  The blurred vision resolved but he still had a little bit of headache in his temples and of the occiput by the time he got to the hospital.  He denies any prior history of stroke. Does not smoke. Takes aspirin 81mg  daily with last dose yesterday morning. Last saw his PCP 3 to 4 months ago but reports that since then his PCP has left the practice.   mRS: 0 tPA/thrombectomy: Not offered due to resolution of symptoms. NIH stroke scale: 0   ROS   Constitutional Denies weight loss, fever and chills.   HEENT Blurred  vision had resolved, no issues with hearing.  Respiratory Denies SOB and cough.   CV Denies palpitations and CP   GI Denies abdominal pain, nausea, vomiting and diarrhea.   GU Denies dysuria and urinary frequency.   MSK Denies myalgia and joint pain.   Skin Denies rash and pruritus.   Neurological Has a mild headache. No syncope.  Psychiatric Denies recent changes in mood. Denies anxiety and depression.   Past History   Past Medical History:  Diagnosis Date   Anger    anger issues   Anxiety    Arthritis    CAD (coronary artery disease)    Dental crowns present    Diabetes mellitus    IDDM - fasting 70-120s   GERD (gastroesophageal reflux disease)    History of concussion    History of DVT (deep vein thrombosis) 03/2005   History of peptic ulcer age 55   Hyperlipidemia    Hypertension    under control, has been on med. > 7 yrs.   MI (myocardial infarction) (HCC)    Myocardial infarction (HCC)    stents   Nephrolithiasis    states has 3 stones in kidney, but no current problems   Overweight(278.02)    Pilonidal cyst 05/2012   area is open, not draining  Pneumonia    hx   PONV (postoperative nausea and vomiting)    S/P coronary artery stent placement 2001, 05/2011   Seasonal allergies    Past Surgical History:  Procedure Laterality Date   CARDIAC CATHETERIZATION  10/06/2010   CARPAL TUNNEL RELEASE Right    CHOLECYSTECTOMY N/A 08/18/2013   Procedure: LAPAROSCOPIC CHOLECYSTECTOMY WITH INTRAOPERATIVE CHOLANGIOGRAM;  Surgeon: Robyne Askew, MD;  Location: [facility] OR;  Service: General;  Laterality: N/A;   CHONDROPLASTY  08/04/2004   right knee   CORONARY ANGIOPLASTY WITH STENT PLACEMENT     x 2; last time 05/2011   CORONARY STENT INTERVENTION N/A 08/22/2018   Procedure: CORONARY STENT INTERVENTION;  Surgeon: Corky Crafts, MD;  Location: [facility] INVASIVE CV LAB;  Service: Cardiovascular;  Laterality: N/A;   DISTAL BICEPS TENDON REPAIR Left 02/19/2016   Procedure: DISTAL BICEPS  TENDON REPAIR;  Surgeon: Jodi Geralds, MD;  Location: [facility];  Service: Orthopedics;  Laterality: Left;   KIDNEY STONE SURGERY  1988 - 1998   x 3   knee arthroscopic     left knee   KNEE CARTILAGE SURGERY  10/2003, 11/2003   Also had bursa removed   LEFT HEART CATH AND CORONARY ANGIOGRAPHY N/A 08/22/2018   Procedure: LEFT HEART CATH AND CORONARY ANGIOGRAPHY;  Surgeon: Corky Crafts, MD;  Location: [facility] INVASIVE CV LAB;  Service: Cardiovascular;  Laterality: N/A;   PILONIDAL CYST DRAINAGE  05/19/2012   Procedure: IRRIGATION AND DEBRIDEMENT PILONIDAL CYST;  Surgeon: Robyne Askew, MD;  Location: [facility];  Service: General;  Laterality: N/A;  I & D Pilonidal abscess, placement of acell   PREPATELLAR BURSA EXCISION  12/05/2004   right   WRIST SURGERY  02/1997   right   Family History  Problem Relation Age of Onset   Diabetes Mother 24   Hypertension Father    Alcohol abuse Father 103   Social History   Socioeconomic History   Marital status: Married    Spouse name: Not on file   Number of children: Not on file   Years of education: Not on file   Highest education level: Not on file  Occupational History   Not on file  Tobacco Use   Smoking status: Former    Types: Cigarettes    Quit date: 02/24/1984    Years since quitting: 37.3   Smokeless tobacco: Never  Substance and Sexual Activity   Alcohol use: Yes    Comment: rare   Drug use: No   Sexual activity: Not on file  Other Topics Concern   Not on file  Social History Narrative   Not on file   Social Determinants of Health   Financial Resource Strain: Not on file  Food Insecurity: Not on file  Transportation Needs: Not on file  Physical Activity: Not on file  Stress: Not on file  Social Connections: Not on file   Allergies  Allergen Reactions   Latex Hives, Swelling and Other (See Comments)    Dry cough    Medications  (Not in a hospital admission)    Vitals   Vitals:    07/03/21 0530  BP: (!) 172/117  Pulse: 73  SpO2: 96%     There is no height or weight on file to calculate BMI.  Physical Exam   General: Laying comfortably in bed; in no acute distress.  HENT: Normal oropharynx and mucosa. Normal external appearance of ears and nose.  Neck: Supple, no  pain or tenderness  CV: No JVD. No peripheral edema.  Pulmonary: Symmetric Chest rise. Normal respiratory effort.  Abdomen: Soft to touch, non-tender.  Ext: No cyanosis, edema, or deformity  Skin: No rash. Normal palpation of skin.   Musculoskeletal: Normal digits and nails by inspection. No clubbing.   Neurologic Examination  Mental status/Cognition: Alert, oriented to self, place, month and year, good attention.  Speech/language: Fluent, comprehension intact, object naming intact, repetition intact.  Cranial nerves:   CN II Pupils equal and reactive to light, no VF deficits    CN III,IV,VI EOM intact, no gaze preference or deviation, no nystagmus    CN V normal sensation in V1, V2, and V3 segments bilaterally    CN VII no asymmetry, no nasolabial fold flattening    CN VIII normal hearing to speech    CN IX & X normal palatal elevation, no uvular deviation    CN XI 5/5 head turn and 5/5 shoulder shrug bilaterally    CN XII midline tongue protrusion    Motor:  Muscle bulk: normal, tone normal, pronator drift none tremor none Mvmt Root Nerve  Muscle Right Left Comments  SA C5/6 Ax Deltoid 5 5   EF C5/6 Mc Biceps 5 5   EE C6/7/8 Rad Triceps 5 5   WF C6/7 Med FCR     WE C7/8 PIN ECU     F Ab C8/T1 U ADM/FDI 5 5   HF L1/2/3 Fem Illopsoas 5 5   KE L2/3/4 Fem Quad 5 5   DF L4/5 D Peron Tib Ant 5 5   PF S1/2 Tibial Grc/Sol 5 5    Reflexes:  Right Left Comments  Pectoralis      Biceps (C5/6) 2 2   Brachioradialis (C5/6) 2 2    Triceps (C6/7) 2 2    Patellar (L3/4) 2 2    Achilles (S1)      Hoffman      Plantar     Jaw jerk    Sensation:  Light touch intact   Pin prick     Temperature    Vibration   Proprioception    Coordination/Complex Motor:  - Finger to Nose intact BL - Heel to shin intact BL - Rapid alternating movement are normal - Gait: Deferred.  Labs   CBC:  Recent Labs  Lab 07/03/21 0509 07/03/21 0514  WBC 8.5  --   NEUTROABS 5.1  --   HGB 13.9 13.9  HCT 40.4 41.0  MCV 94.0  --   PLT 162  --     Basic Metabolic Panel:  Lab Results  Component Value Date   NA 137 07/03/2021   K 3.6 07/03/2021   CO2 27 05/02/2020   GLUCOSE 112 (H) 07/03/2021   BUN 14 07/03/2021   CREATININE 1.60 (H) 07/03/2021   CALCIUM 9.3 05/02/2020   GFRNONAA 54 (L) 05/02/2020   GFRAA >60 05/02/2020   Lipid Panel:  Lab Results  Component Value Date   LDLCALC 30 08/20/2018   HgbA1c:  Lab Results  Component Value Date   HGBA1C 10.4 (H) 08/20/2018   Urine Drug Screen:     Component Value Date/Time   LABOPIA NONE DETECTED 08/20/2018 0951   COCAINSCRNUR NONE DETECTED 08/20/2018 0951   LABBENZ POSITIVE (A) 08/20/2018 0951   AMPHETMU NONE DETECTED 08/20/2018 0951   THCU NONE DETECTED 08/20/2018 0951   LABBARB NONE DETECTED 08/20/2018 0951    Alcohol Level No results found for: [facility]  CT Head without  contrast: Personally reviewed and CTH was negative for a large hypodensity concerning for a large territory infarct or hyperdensity concerning for an ICH. Notable for remote BL putamen strokes.  MR Angio head without contrast and Carotid Duplex BL: pending  MRI Brain: pending  Impression   Elijah Watkins is a 55 y.o. male with PMH significant for DM2, HTN, HLD, MI, PUD, obesity who presents with acute onset episode of left-sided numbness involving his arm and leg and speech apraxia vs dysarthria that lasted 30 mins and self resolved. His neurologic examination is notable for no cognitive or focal deficit.  Suspect that this was likely a TIA or a minor ischemic stroke.  Primary Diagnosis:  Cerebral infarction, unspecified.  Secondary  Diagnosis: Essential (primary) hypertension, Type 2 diabetes mellitus w/o complications, and Morbid Obesity(BMI > 40)  Recommendations   Plan:  - Frequent Neuro checks per stroke unit protocol - Recommend brain imaging with MRI Brain without contrast - Recommend Vascular imaging with MRA Angio Head without contrast and US Carotid doppler - Recommend obtaining TTE with bubble study - Recommend obtaining Lipid panel with LDL - Please start statin if LDL > 70 - Recommend HbA1c - Antithrombotic - Aspirin 81mg  daily along with plavix 75mg  daily for 21 days, followed by aspirin 81mg  daily alone. - Recommend DVT ppx - SBP goal - permissive hypertension first 24 h < 220/110. Held home meds.  - Recommend Telemetry monitoring for arrythmia - Recommend bedside swallow screen prior to PO intake. - Stroke education booklet - Recommend PT/OT/SLP consult  ______________________________________________________________________   Thank you for the opportunity to take part in the care of this patient. If you have any further questions, please contact the neurology consultation attending.  Signed,  [facility] Pager Number _ _ _   _ __   _ __ _ _  __ __   _ __   __ _

## 2021-07-03 NOTE — Progress Notes (Signed)
Carotid duplex has been completed.   Preliminary results in CV Proc.   Blanch Media 07/03/2021 10:25 AM

## 2021-07-03 NOTE — Discharge Summary (Signed)
Physician Discharge Summary  Elijah Watkins:[id number] DOB: Jul 03, 1966 DOA: 07/03/2021  PCP: Default, Provider, MD  Admit date: 07/03/2021 Discharge date: 07/03/2021  Time spent: 34 minutes  Recommendations for Outpatient Follow-up:  Aspirin 81+ Plavix 75 recommended for 21 days and then discontinue aspirin and continue lifelong Plavix for secondary stroke prevention as per the neurologist Needs outpatient sleep study Needs outpatient weight loss strategies Please follow as an outpatient his A1c   Discharge Diagnoses:  MAIN problem for hospitalization   Probable TIA improved  Please see below for itemized issues addressed in HOpsital- refer to other progress notes for clarity if needed  Discharge Condition: improved  Diet recommendation: Heart healthy  There were no vitals filed for this visit.  History of present illness:   55 year old white male known CAD MI most recently 2019 in the setting of prior PCI 2012) DM TY 2 HLD HTN BMI >40 with severe obesity class II, probable OSA admit 07/03/2021 with unilateral numbness weakness left side which resolved by the time he had come to the emergency room BP very elevated on admission creatinine slightly elevated-CT scan head showed remote lacunar infarcts Patient was seen by neurology who recommended DAPT Patient's MRI performed was negative, vascular ultrasound negative, echocardiogram did not show any specific anomalies and patient was seen by therapy services his LDL was found to be excellent at 30, A1c was pending at time of discharge He was going to be enrolled in the sleep study under neurologist Dr Pearlean Brownie however patient elected to go home He was felt to be stabilized for discharge and completed entire stroke work-up and hospital stay and was discharged in a stable state with no need for therapy services on follow-up-he will need follow-up with his primary care physician and/or Dr Pearlean Brownie to ensure that he gets a sleep study-he  is in the process of changing insurances so I have asked TOC to speak to him about getting a PCP   Discharge Exam: Vitals:   07/03/21 0645 07/03/21 1023  BP: 107/67 (!) 152/79  Pulse: 62 61  Resp: 14 16  Temp:    SpO2: 95% 97%    Subj on day of d/c   Alert pleasant oriented no distress EOMI NCAT smile symmetric uvula midline shoulder shrug bilaterally equal S1-S2 no murmur multiple tattoos upper extremities sensory grossly intact power 5/5 reflexes attenuated, finnger-nose-finger intact attenuated  General Exam on discharge As above   Discharge Instructions   Discharge Instructions     Ambulatory referral to Neurology   Complete by: As directed    An appointment is requested in approximately: 4-8 weeks   Diet - low sodium heart healthy   Complete by: As directed    Discharge instructions   Complete by: As directed    You were diagnosed with a mini stroke and will probably need to be on 2 medications that will protect you from strokes for at least 21 days-one of them is aspirin the other 1 is Plavix--- because you are on aspirin prior to admission, you should discontinue this after 21 days of taking it and then continue to take Plavix lifelong  We will try to obtain a primary care physician for you so that you can get an outpatient sleep study Please continue all of your heart medications in addition to your medications for diabetes and use the monitor to check your sugars in the outpatient setting and resume your diabetes meds as you have been taking them at home  Please return  to the ED for any concerns of weakness on one side of the body blurred vision double vision severe headaches   Increase activity slowly   Complete by: As directed       Allergies as of 07/03/2021       Reactions   Latex Hives, Swelling, Other (See Comments)   Dry cough        Medication List     TAKE these medications    allopurinol 300 MG tablet Commonly known as: ZYLOPRIM Take 300  mg by mouth daily.   ALPRAZolam 1 MG tablet Commonly known as: XANAX Take 1-2 mg by mouth 2 (two) times daily as needed for anxiety.   aspirin 81 MG chewable tablet Chew 1 tablet (81 mg total) by mouth daily for 21 days.   atorvastatin 80 MG tablet Commonly known as: LIPITOR Take 80 mg by mouth daily.   clopidogrel 75 MG tablet Commonly known as: PLAVIX Take 1 tablet (75 mg total) by mouth daily. Start taking on: July 04, 2021   gabapentin 600 MG tablet Commonly known as: NEURONTIN Take 600 mg by mouth 2 (two) times daily.   insulin NPH-regular Human (70-30) 100 UNIT/ML injection Inject 80 Units into the skin 2 (two) times daily with a meal.   lisinopril 5 MG tablet Commonly known as: ZESTRIL Take 5 mg by mouth daily.   metoprolol tartrate 50 MG tablet Commonly known as: LOPRESSOR Take 50 mg by mouth 2 (two) times daily.   nitroGLYCERIN 0.4 MG SL tablet Commonly known as: NITROSTAT Place 0.4 mg under the tongue every 5 (five) minutes as needed. x3 doses as needed for chest pain   omeprazole 20 MG capsule Commonly known as: PRILOSEC Take 20 mg by mouth 2 (two) times daily before a meal.   zolpidem 10 MG tablet Commonly known as: AMBIEN Take 10 mg by mouth at bedtime.       Allergies  Allergen Reactions   Latex Hives, Swelling and Other (See Comments)    Dry cough      The results of significant diagnostics from this hospitalization (including imaging, microbiology, ancillary and laboratory) are listed below for reference.    Significant Diagnostic Studies: DG Chest 2 View  Result Date: 07/03/2021 CLINICAL DATA:  Numbness and weakness. EXAM: CHEST - 2 VIEW COMPARISON:  05/02/2020. FINDINGS: Mediastinum and hilar structures normal. Borderline cardiomegaly. No pulmonary venous congestion. Low lung volumes. No focal infiltrate. No pleural effusion or pneumothorax. IMPRESSION: 1.  Borderline cardiomegaly.  No pulmonary venous congestion. 2.  Low lung volumes.   No acute pulmonary disease. Electronically Signed   By: Maisie Fus  Register   On: 07/03/2021 07:20   MR ANGIO HEAD WO CONTRAST  Result Date: 07/03/2021 CLINICAL DATA:  TIA. Transient bilateral lower extremity weakness, low left arm and leg numbness and tingling, and difficulty speaking. EXAM: MRI HEAD WITHOUT CONTRAST MRA HEAD WITHOUT CONTRAST TECHNIQUE: Multiplanar, multi-echo pulse sequences of the brain and surrounding structures were acquired without intravenous contrast. Angiographic images of the Circle of Willis were acquired using MRA technique without intravenous contrast. COMPARISON:  CT head 07/03/2021 FINDINGS: MRI HEAD FINDINGS Brain: There is no evidence of an acute infarct, intracranial hemorrhage, mass, midline shift, or extra-axial fluid collection. Small T2 hyperintensities in the cerebral white matter bilaterally are nonspecific but compatible with mild chronic small vessel ischemic disease. There are chronic lacunar infarcts in the basal ganglia bilaterally. There is moderate cerebral atrophy with prominent enlargement of the sylvian fissures. Vascular: Major intracranial vascular  flow voids are preserved. Skull and upper cervical spine: Unremarkable bone marrow signal. Sinuses/Orbits: Bilateral proptosis. Evidence of chronic sinusitis in a right middle meatus obstruction pattern with complete opacification of the right frontal sinus, right maxillary sinus, and anterior right ethmoid air cells. Clear mastoid air cells. Other: None. MRA HEAD FINDINGS Anterior circulation: The internal carotid arteries are widely patent from skull base to carotid termini. ACAs and MCAs are patent without evidence of a proximal branch occlusion or significant proximal stenosis. No aneurysm is identified. Posterior circulation: The included distal right vertebral artery is widely patent and strongly dominant, supplying the basilar. The left vertebral artery is hypoplastic and ends in PICA. Patent bilateral PICA,  left AICA, and bilateral SCA origins are identified. There are diminutive right and moderate-sized left posterior communicating arteries. Both PCAs are patent without evidence of a significant proximal stenosis. No aneurysm is identified. Anatomic variants: None. IMPRESSION: 1. No acute intracranial abnormality. 2. Mild chronic small vessel ischemic disease and moderate cerebral atrophy. 3. Negative head MRA. Electronically Signed   By: Sebastian AcheAllen  Grady M.D.   On: 07/03/2021 09:26   MR BRAIN WO CONTRAST  Result Date: 07/03/2021 CLINICAL DATA:  TIA. Transient bilateral lower extremity weakness, low left arm and leg numbness and tingling, and difficulty speaking. EXAM: MRI HEAD WITHOUT CONTRAST MRA HEAD WITHOUT CONTRAST TECHNIQUE: Multiplanar, multi-echo pulse sequences of the brain and surrounding structures were acquired without intravenous contrast. Angiographic images of the Circle of Willis were acquired using MRA technique without intravenous contrast. COMPARISON:  CT head 07/03/2021 FINDINGS: MRI HEAD FINDINGS Brain: There is no evidence of an acute infarct, intracranial hemorrhage, mass, midline shift, or extra-axial fluid collection. Small T2 hyperintensities in the cerebral white matter bilaterally are nonspecific but compatible with mild chronic small vessel ischemic disease. There are chronic lacunar infarcts in the basal ganglia bilaterally. There is moderate cerebral atrophy with prominent enlargement of the sylvian fissures. Vascular: Major intracranial vascular flow voids are preserved. Skull and upper cervical spine: Unremarkable bone marrow signal. Sinuses/Orbits: Bilateral proptosis. Evidence of chronic sinusitis in a right middle meatus obstruction pattern with complete opacification of the right frontal sinus, right maxillary sinus, and anterior right ethmoid air cells. Clear mastoid air cells. Other: None. MRA HEAD FINDINGS Anterior circulation: The internal carotid arteries are widely patent  from skull base to carotid termini. ACAs and MCAs are patent without evidence of a proximal branch occlusion or significant proximal stenosis. No aneurysm is identified. Posterior circulation: The included distal right vertebral artery is widely patent and strongly dominant, supplying the basilar. The left vertebral artery is hypoplastic and ends in PICA. Patent bilateral PICA, left AICA, and bilateral SCA origins are identified. There are diminutive right and moderate-sized left posterior communicating arteries. Both PCAs are patent without evidence of a significant proximal stenosis. No aneurysm is identified. Anatomic variants: None. IMPRESSION: 1. No acute intracranial abnormality. 2. Mild chronic small vessel ischemic disease and moderate cerebral atrophy. 3. Negative head MRA. Electronically Signed   By: Sebastian AcheAllen  Grady M.D.   On: 07/03/2021 09:26   ECHOCARDIOGRAM COMPLETE  Result Date: 07/03/2021    ECHOCARDIOGRAM REPORT   Patient Name:   Elijah Watkins Date of Exam: 07/03/2021 Medical Rec #:  [id number]          Height:       71.0 in Accession #:    [id number]         Weight:       287.7 lb Date of Birth:  1966/11/06  BSA:          2.461 m Patient Age:    55 years           BP:           126/81 mmHg Patient Gender: M                  HR:           62 bpm. Exam Location:  Inpatient Procedure: 2D Echo, Intracardiac Opacification Agent, Cardiac Doppler and Color            Doppler Indications:    TIA  History:        Patient has prior history of Echocardiogram examinations, most                 recent 10/05/2010. CAD and Previous Myocardial Infarction,                 Signs/Symptoms:Chest Pain; Risk Factors:Diabetes.  Sonographer:    Sheralyn Boatman RDCS Referring Phys: [id number] [facility] A THOMAS  Sonographer Comments: Technically difficult study due to poor echo windows and patient is morbidly obese. Image acquisition challenging due to patient body habitus. IMPRESSIONS  1. Left ventricular ejection  fraction, by estimation, is 55 to 60%. The left ventricle has normal function. The left ventricle has no regional wall motion abnormalities. There is mild left ventricular hypertrophy. Left ventricular diastolic parameters are consistent with Grade I diastolic dysfunction (impaired relaxation).  2. Right ventricular systolic function is normal. The right ventricular size is normal. Tricuspid regurgitation signal is inadequate for assessing PA pressure.  3. The mitral valve is normal in structure. No evidence of mitral valve regurgitation.  4. The aortic valve was not well visualized. Aortic valve regurgitation is not visualized. No aortic stenosis is present. Comparison(s): A prior study was performed on 10/05/2010. Prior images unable to be directly viewed, comparison made by report only. No significant change from prior report. FINDINGS  Left Ventricle: Left ventricular ejection fraction, by estimation, is 55 to 60%. The left ventricle has normal function. The left ventricle has no regional wall motion abnormalities. Definity contrast agent was given IV to delineate the left ventricular  endocardial borders. The left ventricular internal cavity size was normal in size. There is mild left ventricular hypertrophy. Left ventricular diastolic parameters are consistent with Grade I diastolic dysfunction (impaired relaxation). Right Ventricle: The right ventricular size is normal. No increase in right ventricular wall thickness. Right ventricular systolic function is normal. Tricuspid regurgitation signal is inadequate for assessing PA pressure. Left Atrium: Left atrial size was normal in size. Right Atrium: Right atrial size was normal in size. Pericardium: There is no evidence of pericardial effusion. Mitral Valve: The mitral valve is normal in structure. No evidence of mitral valve regurgitation. Tricuspid Valve: The tricuspid valve is normal in structure. Tricuspid valve regurgitation is not demonstrated. Aortic  Valve: The aortic valve was not well visualized. Aortic valve regurgitation is not visualized. No aortic stenosis is present. Pulmonic Valve: The pulmonic valve was normal in structure. Pulmonic valve regurgitation is not visualized. Aorta: The aortic root and ascending aorta are structurally normal, with no evidence of dilitation. IAS/Shunts: The interatrial septum was not well visualized.  LEFT VENTRICLE PLAX 2D LVIDd:         4.80 cm      Diastology LVIDs:         4.10 cm      LV e' medial:    4.24 cm/s LV  PW:         1.40 cm      LV E/e' medial:  19.2 LV IVS:        1.40 cm      LV e' lateral:   8.16 cm/s LVOT diam:     2.00 cm      LV E/e' lateral: 10.0 LV SV:         65 LV SV Index:   27 LVOT Area:     3.14 cm  LV Volumes (MOD) LV vol d, MOD A2C: 199.0 ml LV vol d, MOD A4C: 168.0 ml LV vol s, MOD A2C: 111.0 ml LV vol s, MOD A4C: 77.5 ml LV SV MOD A2C:     88.0 ml LV SV MOD A4C:     168.0 ml LV SV MOD BP:      92.1 ml RIGHT VENTRICLE RV S prime:     9.68 cm/s TAPSE (M-mode): 1.8 cm LEFT ATRIUM             Index       RIGHT ATRIUM          Index LA diam:        4.00 cm 1.63 cm/m  RA Area:     9.68 cm LA Vol (A2C):   45.2 ml 18.37 ml/m RA Volume:   18.20 ml 7.40 ml/m LA Vol (A4C):   52.2 ml 21.21 ml/m LA Biplane Vol: 51.8 ml 21.05 ml/m  AORTIC VALVE LVOT Vmax:   112.00 cm/s LVOT Vmean:  70.800 cm/s LVOT VTI:    0.208 m  AORTA Ao Root diam: 3.90 cm Ao Asc diam:  3.40 cm MITRAL VALVE MV Area (PHT): 3.85 cm    SHUNTS MV Decel Time: 197 msec    Systemic VTI:  0.21 m MV E velocity: 81.40 cm/s  Systemic Diam: 2.00 cm MV A velocity: 65.10 cm/s MV E/A ratio:  1.25 Riley Lam MD Electronically signed by Riley Lam MD Signature Date/Time: 07/03/2021/12:56:22 PM    Final    CT HEAD CODE STROKE WO CONTRAST  Result Date: 07/03/2021 CLINICAL DATA:  Code stroke. EXAM: CT HEAD WITHOUT CONTRAST TECHNIQUE: Contiguous axial images were obtained from the base of the skull through the vertex without  intravenous contrast. COMPARISON:  03/29/2008 FINDINGS: Brain: No evidence of acute infarction, hemorrhage, hydrocephalus, extra-axial collection or mass lesion/mass effect. Chronic appearing lacunar infarcts have occurred in the bilateral putamen since prior. Interval disproportionate subarachnoid spaces with sylvian fissure widening and sulcal effacement at the vertex but no notable ventriculomegaly to implicate communicating hydrocephalus. Vascular: Premature atheromatous calcification Skull: Normal. Negative for fracture or focal lesion. Sinuses/Orbits: Interval right middle meatus obstruction with sinusitis and complete opacification. Other: These results were communicated to Dr. Derry Lory at 5:20 am on 07/03/2021 by text page via the [facility] messaging system. ASPECTS [facility] Stroke Program Early CT Score) Not scored without localizing symptoms IMPRESSION: 1. No acute finding. 2. 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 3. Right sinusitis from middle meatus obstruction. Electronically Signed   By: Marnee Spring M.D.   On: 07/03/2021 05:22   VAS US CAROTID  Result Date: 07/03/2021 Carotid Arterial Duplex Study Patient Name:  Elijah Watkins  Date of Exam:   07/03/2021 Medical Rec #: [id number]           Accession #:    [id number] Date of Birth: [birth year]           Patient Gender: M Patient  Age:   62Y Exam Location:  [facility] Procedure:      VAS US CAROTID Referring Phys: [id number] [facility] A THOMAS --------------------------------------------------------------------------------  Indications:       TIA. Risk Factors:      Hypertension, hyperlipidemia, Diabetes, prior MI. Comparison Study:  no prior Performing Technologist: [place] Ponder RVS  Examination Guidelines: A complete evaluation includes B-mode imaging, spectral Doppler, color Doppler, and power Doppler as needed of all accessible portions of each vessel. Bilateral testing is considered an  integral part of a complete examination. Limited examinations for reoccurring indications may be performed as noted.  Right Carotid Findings: +----------+--------+--------+--------+------------------+--------+           PSV cm/sEDV cm/sStenosisPlaque DescriptionComments +----------+--------+--------+--------+------------------+--------+ CCA Prox  98      17              heterogenous               +----------+--------+--------+--------+------------------+--------+ CCA Distal88      17              heterogenous               +----------+--------+--------+--------+------------------+--------+ ICA Prox  86      22      1-39%   heterogenous               +----------+--------+--------+--------+------------------+--------+ ICA Distal122     40                                         +----------+--------+--------+--------+------------------+--------+ ECA       105     15                                         +----------+--------+--------+--------+------------------+--------+ +----------+--------+-------+--------+-------------------+           PSV cm/sEDV cmsDescribeArm Pressure (mmHG) +----------+--------+-------+--------+-------------------+ Subclavian127                                        +----------+--------+-------+--------+-------------------+ +---------+--------+--+--------+--+---------+ VertebralPSV cm/s47EDV cm/s15Antegrade +---------+--------+--+--------+--+---------+  Left Carotid Findings: +----------+--------+--------+--------+------------------+--------+           PSV cm/sEDV cm/sStenosisPlaque DescriptionComments +----------+--------+--------+--------+------------------+--------+ CCA Prox  100     17              heterogenous               +----------+--------+--------+--------+------------------+--------+ CCA Distal76      18              heterogenous                +----------+--------+--------+--------+------------------+--------+ ICA Prox  82      23      1-39%   heterogenous               +----------+--------+--------+--------+------------------+--------+ ICA Distal74      23                                         +----------+--------+--------+--------+------------------+--------+ ECA       80      9                                          +----------+--------+--------+--------+------------------+--------+ +----------+--------+--------+--------+-------------------+  PSV cm/sEDV cm/sDescribeArm Pressure (mmHG) +----------+--------+--------+--------+-------------------+ [id number]                                         +----------+--------+--------+--------+-------------------+ +---------+--------+--------+--------------+ VertebralPSV cm/sEDV cm/sNot identified +---------+--------+--------+--------------+   Summary: Right Carotid: Velocities in the right ICA are consistent with a 1-39% stenosis. Left Carotid: Velocities in the left ICA are consistent with a 1-39% stenosis. Vertebrals: Right vertebral artery demonstrates antegrade flow. Left vertebral             artery was not visualized. *See table(s) above for measurements and observations.  Electronically signed by Delia Heady MD on 07/03/2021 at 1:00:42 PM.    Final     Microbiology: Recent Results (from the past 240 hour(s))  SARS CORONAVIRUS 2 (TAT 6-24 HRS) Nasopharyngeal Nasopharyngeal Swab     Status: None   Collection Time: 07/03/21  5:42 AM   Specimen: Nasopharyngeal Swab  Result Value Ref Range Status   SARS Coronavirus 2 NEGATIVE NEGATIVE Final    Comment: (NOTE) SARS-CoV-2 target nucleic acids are NOT DETECTED.  The SARS-CoV-2 RNA is generally detectable in upper and lower respiratory specimens during the acute phase of infection. Negative results do not preclude SARS-CoV-2 infection, do not rule out co-infections with other pathogens, and  should not be used as the sole basis for treatment or other patient management decisions. Negative results must be combined with clinical observations, patient history, and epidemiological information. The expected result is Negative.  Fact Sheet for Patients: HairSlick.no  Fact Sheet for Healthcare Providers: quierodirigir.com  This test is not yet approved or cleared by the [place] FDA and  has been authorized for detection and/or diagnosis of SARS-CoV-2 by FDA under an Emergency Use Authorization (EUA). This EUA will remain  in effect (meaning this test can be used) for the duration of the COVID-19 declaration under Se ction 564(b)(1) of the Act, 21 U.S.C. section 360bbb-3(b)(1), unless the authorization is terminated or revoked sooner.  Performed at [facility], [address], [place], [place] [address]      Labs: Basic Metabolic Panel: Recent Labs  Lab 07/03/21 0509 07/03/21 0514  NA 136 137  K 3.6 3.6  CL 100 101  CO2 24  --   GLUCOSE 110* 112*  BUN 13 14  CREATININE 1.46* 1.60*  CALCIUM 8.5*  --    Liver Function Tests: Recent Labs  Lab 07/03/21 0509  AST 39  ALT 47*  ALKPHOS 54  BILITOT 0.4  PROT 6.2*  ALBUMIN 3.4*   No results for input(s): LIPASE, AMYLASE in the last 168 hours. No results for input(s): AMMONIA in the last 168 hours. CBC: Recent Labs  Lab 07/03/21 0509 07/03/21 0514  WBC 8.5  --   NEUTROABS 5.1  --   HGB 13.9 13.9  HCT 40.4 41.0  MCV 94.0  --   PLT 162  --    Cardiac Enzymes: No results for input(s): CKTOTAL, CKMB, CKMBINDEX, TROPONINI in the last 168 hours. BNP: BNP (last 3 results) No results for input(s): BNP in the last 8760 hours.  ProBNP (last 3 results) No results for input(s): PROBNP in the last 8760 hours.  CBG: Recent Labs  Lab 07/03/21 0514 07/03/21 1033  GLUCAP 127* 209*       Signed:  Rhetta Mura MD   [facility] 07/03/2021, 1:38 PM

## 2021-07-03 NOTE — ED Provider Notes (Signed)
[facility] EMERGENCY DEPARTMENT Provider Note  CSN: [id number] Arrival date & time: 07/03/21 7416  Chief Complaint(s) Code Stroke  HPI Elijah Watkins is a 55 y.o. male with a past medical history listed below who presents to the emergency department as a code stroke.  Patient reports that around 2-2:30 AM she got up to go to the restroom and felt weak in both lower extremities which was followed by left-sided numbness/tingling and aphasia that lasted approximately 15 minutes.This was accompanied by temporal headache which is consistent with his prior headaches.  No nausea or vomiting.  No chest pain or shortness of breath.  No falls or trauma.  No recent fevers or infections.  No other physical complaints.  HPI  Past Medical History Past Medical History:  Diagnosis Date   Anger    anger issues   Anxiety    Arthritis    CAD (coronary artery disease)    Dental crowns present    Diabetes mellitus    IDDM - fasting 70-120s   GERD (gastroesophageal reflux disease)    History of concussion    History of DVT (deep vein thrombosis) 03/2005   History of peptic ulcer age 72   Hyperlipidemia    Hypertension    under control, has been on med. > 7 yrs.   MI (myocardial infarction) (HCC)    Myocardial infarction (HCC)    stents   Nephrolithiasis    states has 3 stones in kidney, but no current problems   Overweight(278.02)    Pilonidal cyst 05/2012   area is open, not draining   Pneumonia    hx   PONV (postoperative nausea and vomiting)    S/P coronary artery stent placement 2001, 05/2011   Seasonal allergies    Patient Active Problem List   Diagnosis Date Noted   Obesity, Class III, BMI 40-49.9 (morbid obesity) (HCC) 09/02/2018   S/P angioplasty with stent 08/31/2018   Dyslipidemia, goal LDL below 70 08/20/2018   GERD (gastroesophageal reflux disease) 08/20/2018   CKD (chronic kidney disease), stage III (HCC) 08/20/2018   Anxiety 08/20/2018   Unstable  angina (HCC)    Gallstones 08/02/2013   Type II diabetes mellitus with renal manifestations (HCC) 02/24/2012   Myocardial infarct, old 02/24/2012   Coronary artery disease 02/24/2012   Hypertension 02/24/2012   Gout 02/24/2012   Obesity (BMI 30-39.9) 02/24/2012   Depression 02/24/2012   Pilonidal abscess 02/24/2012   PAIN IN LIMB 01/01/2011   Home Medication(s) Prior to Admission medications   Medication Sig Start Date End Date Taking? Authorizing Provider  allopurinol (ZYLOPRIM) 300 MG tablet Take 300 mg by mouth daily.   Yes [provider]  ALPRAZolam Prudy Feeler) 1 MG tablet Take 1-2 mg by mouth 2 (two) times daily as needed for anxiety.   Yes [provider]  aspirin 81 MG chewable tablet Chew 1 tablet (81 mg total) by mouth daily. 08/24/18  Yes Hongalgi, Maximino Greenland, MD  atorvastatin (LIPITOR) 80 MG tablet Take 80 mg by mouth daily.   Yes [provider]  gabapentin (NEURONTIN) 600 MG tablet Take 600 mg by mouth 2 (two) times daily.   Yes [provider]  insulin NPH-regular Human (NOVOLIN 70/30) (70-30) 100 UNIT/ML injection Inject 80 Units into the skin 2 (two) times daily with a meal.   Yes [provider]  lisinopril (PRINIVIL,ZESTRIL) 5 MG tablet Take 5 mg by mouth daily.   Yes [provider]  metoprolol (LOPRESSOR) 50 MG tablet  Take 50 mg by mouth 2 (two) times daily.   Yes [provider]  nitroGLYCERIN (NITROSTAT) 0.4 MG SL tablet Place 0.4 mg under the tongue every 5 (five) minutes as needed. x3 doses as needed for chest pain   Yes [provider]  omeprazole (PRILOSEC) 20 MG capsule Take 20 mg by mouth 2 (two) times daily before a meal.   Yes [provider]  zolpidem (AMBIEN) 10 MG tablet Take 10 mg by mouth at bedtime.    Yes [provider]                                                                                                                                    Past Surgical  History Past Surgical History:  Procedure Laterality Date   CARDIAC CATHETERIZATION  10/06/2010   CARPAL TUNNEL RELEASE Right    CHOLECYSTECTOMY N/A 08/18/2013   Procedure: LAPAROSCOPIC CHOLECYSTECTOMY WITH INTRAOPERATIVE CHOLANGIOGRAM;  Surgeon: Robyne Askew, MD;  Location: [facility] OR;  Service: General;  Laterality: N/A;   CHONDROPLASTY  08/04/2004   right knee   CORONARY ANGIOPLASTY WITH STENT PLACEMENT     x 2; last time 05/2011   CORONARY STENT INTERVENTION N/A 08/22/2018   Procedure: CORONARY STENT INTERVENTION;  Surgeon: Corky Crafts, MD;  Location: [facility] INVASIVE CV LAB;  Service: Cardiovascular;  Laterality: N/A;   DISTAL BICEPS TENDON REPAIR Left 02/19/2016   Procedure: DISTAL BICEPS TENDON REPAIR;  Surgeon: Jodi Geralds, MD;  Location: [facility];  Service: Orthopedics;  Laterality: Left;   KIDNEY STONE SURGERY  1988 - 1998   x 3   knee arthroscopic     left knee   KNEE CARTILAGE SURGERY  10/2003, 11/2003   Also had bursa removed   LEFT HEART CATH AND CORONARY ANGIOGRAPHY N/A 08/22/2018   Procedure: LEFT HEART CATH AND CORONARY ANGIOGRAPHY;  Surgeon: Corky Crafts, MD;  Location: [facility] INVASIVE CV LAB;  Service: Cardiovascular;  Laterality: N/A;   PILONIDAL CYST DRAINAGE  05/19/2012   Procedure: IRRIGATION AND DEBRIDEMENT PILONIDAL CYST;  Surgeon: Robyne Askew, MD;  Location:  SURGERY CENTER;  Service: General;  Laterality: N/A;  I & D Pilonidal abscess, placement of acell   PREPATELLAR BURSA EXCISION  12/05/2004   right   WRIST SURGERY  02/1997   right   Family History Family History  Problem Relation Age of Onset   Diabetes Mother 46   Hypertension Father    Alcohol abuse Father 68    Social History Social History   Tobacco Use   Smoking status: Former    Types: Cigarettes    Quit date: 02/24/1984    Years since quitting: 37.3   Smokeless tobacco: Never  Substance Use Topics   Alcohol use: Yes    Comment: rare   Drug use: No    Allergies Latex  Review of Systems Review of Systems All other  systems are reviewed and are negative for acute change except as noted in the HPI  Physical Exam Vital Signs  I have reviewed the triage vital signs BP 120/78   Pulse 62   Temp (!) 97.5 F (36.4 C) (Oral)   Resp 13   SpO2 99%   Physical Exam Vitals reviewed.  Constitutional:      General: He is not in acute distress.    Appearance: He is well-developed. He is not diaphoretic.  HENT:     Head: Normocephalic and atraumatic.     Nose: Nose normal.  Eyes:     General: No scleral icterus.       Right eye: No discharge.        Left eye: No discharge.     Conjunctiva/sclera: Conjunctivae normal.     Pupils: Pupils are equal, round, and reactive to light.  Cardiovascular:     Rate and Rhythm: Normal rate and regular rhythm.     Heart sounds: No murmur heard.   No friction rub. No gallop.  Pulmonary:     Effort: Pulmonary effort is normal. No respiratory distress.     Breath sounds: Normal breath sounds. No stridor. No rales.  Abdominal:     General: There is no distension.     Palpations: Abdomen is soft.     Tenderness: There is no abdominal tenderness.  Musculoskeletal:        General: No tenderness.     Cervical back: Normal range of motion and neck supple.  Skin:    General: Skin is warm and dry.     Findings: No erythema or rash.  Neurological:     Mental Status: He is alert and oriented to person, place, and time.     Cranial Nerves: Cranial nerves are intact.     Sensory: Sensation is intact.     Motor: Motor function is intact.     Comments: Detailed exam by Neurology    ED Results and Treatments Labs (all labs ordered are listed, but only abnormal results are displayed) Labs Reviewed  I-STAT CHEM 8, ED - Abnormal; Notable for the following components:      Result Value   Creatinine, Ser 1.60 (*)    Glucose, Bld 112 (*)    Calcium, Ion 0.98 (*)    All other components within normal  limits  CBG MONITORING, ED - Abnormal; Notable for the following components:   Glucose-Capillary 127 (*)    All other components within normal limits  SARS CORONAVIRUS 2 (TAT 6-24 HRS)  PROTIME-INR  APTT  CBC  DIFFERENTIAL  COMPREHENSIVE METABOLIC PANEL                                                                                                                         EKG   EKG Interpretation  Date/Time:  Thursday July 03 2021 06:16:01 EDT Ventricular Rate:  67 PR Interval:  155 QRS Duration: 103 QT Interval:  441 QTC Calculation:  466 R Axis:   63 Text Interpretation: Sinus rhythm No acute changes Confirmed by Drema Pry [phone]) on 07/03/2021 6:20:17 AM       Radiology CT HEAD CODE STROKE WO CONTRAST  Result Date: 07/03/2021 CLINICAL DATA:  Code stroke. EXAM: CT HEAD WITHOUT CONTRAST TECHNIQUE: Contiguous axial images were obtained from the base of the skull through the vertex without intravenous contrast. COMPARISON:  03/29/2008 FINDINGS: Brain: No evidence of acute infarction, hemorrhage, hydrocephalus, extra-axial collection or mass lesion/mass effect. Chronic appearing lacunar infarcts have occurred in the bilateral putamen since prior. Interval disproportionate subarachnoid spaces with sylvian fissure widening and sulcal effacement at the vertex but no notable ventriculomegaly to implicate communicating hydrocephalus. Vascular: Premature atheromatous calcification Skull: Normal. Negative for fracture or focal lesion. Sinuses/Orbits: Interval right middle meatus obstruction with sinusitis and complete opacification. Other: These results were communicated to Dr. Derry Lory at 5:20 am on 07/03/2021 by text page via the [facility] messaging system. ASPECTS [facility] Stroke Program Early CT Score) Not scored without localizing symptoms IMPRESSION: 1. No acute finding. 2. 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 3. Right sinusitis from middle meatus  obstruction. Electronically Signed   By: Marnee Spring M.D.   On: 07/03/2021 05:22    Pertinent labs & imaging results that were available during my care of the patient were reviewed by me and considered in my medical decision making (see chart for details).  Medications Ordered in ED Medications  sodium chloride flush (NS) 0.9 % injection 3 mL (3 mLs Intravenous Not Given 07/03/21 8675)  aspirin chewable tablet 81 mg (81 mg Oral Given 07/03/21 4492)    Or  aspirin suppository 300 mg ( Rectal See Alternative 07/03/21 0100)  clopidogrel (PLAVIX) tablet 75 mg (has no administration in time range)  prochlorperazine (COMPAZINE) injection 10 mg (10 mg Intravenous Given 07/03/21 0619)  0.9 %  sodium chloride infusion ( Intravenous New Bag/Given 07/03/21 0620)                                                                                                                                    Procedures Procedures  (including critical care time)  Medical Decision Making / ED Course I have reviewed the nursing notes for this encounter and the patient's prior records (if available in EHR or on provided paperwork).   Elijah Watkins was evaluated in Emergency Department on 07/03/2021 for the symptoms described in the history of present illness. He was evaluated in the context of the global COVID-19 pandemic, which necessitated consideration that the patient might be at risk for infection with the SARS-CoV-2 virus that causes COVID-19. Institutional protocols and algorithms that pertain to the evaluation of patients at risk for COVID-19 are in a state of rapid change based on information released by regulatory bodies including the CDC and federal and state organizations. These policies and algorithms were followed during the patient's care  in the ED.  Code stroke. Symptoms resolved. Complex migraine versus TIA. CT head Noncon notable for remote lacunar infarcts not seen on last head imaging in  2009. Labs reassuring without leukocytosis or anemia.  No significant electrolyte derangements.  Patient has mild renal insufficiency close to his baseline.  Patient will be admitted to medicine for TIA work-up given his remote infarcts.     Final Clinical Impression(s) / ED Diagnoses Final diagnoses:  TIA (transient ischemic attack)      This chart was dictated using voice recognition software.  Despite best efforts to proofread,  errors can occur which can change the documentation meaning.    Nira Conn, MD 07/03/21 [phone]

## 2021-07-03 NOTE — Progress Notes (Signed)
SLP Cancellation Note  Patient Details Name: SEVILLE DOWNS MRN: [id number] DOB: 1966/11/22   Cancelled treatment:       Reason Eval/Treat Not Completed: SLP screened, no needs identified, will sign off; symptoms have resolved; MRI negative; no issues per pt/nursing staff re: speech/language/cognition or swallowing.   Tressie Stalker, M.S., CCC-SLP 07/03/2021, 11:18 AM

## 2021-07-03 NOTE — ED Notes (Signed)
Pt returned from MRI, echo at bedside.

## 2021-07-03 NOTE — ED Triage Notes (Signed)
Pt from home by EMS as Code Stroke. Pt got up to use the restroom at 0230, started having bilateral lower extremity weakness then L arm and L leg numbness and tingling. Pt attempted to speak to his wife and had difficulty getting his words out. On arrival, pt has no neuro symptoms. Pt c/o headache

## 2021-07-03 NOTE — Code Documentation (Addendum)
Stroke Response Nurse Documentation Code Documentation  Elijah Watkins is a 55 y.o. male arriving to [facility] ED via [facility] on 7/21 with past medical hx of HTN, HLD, CAD/MI. Code stroke was activated by EMS. Patient from home where he was LKW at 0230 and now complaining of left sided numbness and trouble speaking . On aspirin 81 mg daily. Stroke team at the bedside on patient arrival. Labs drawn and patient cleared for CT by Dr. Eudelia Bunch. Patient to CT with team. NIHSS 0, see documentation for details and code stroke times. Patient with no deficits on exam. The following imaging was completed:  CTH. Patient is not a candidate for tPA due resolution of symptoms Care/Plan. Bedside handoff with ED RN Jon Gills.    Rose Fillers  Rapid Response RN

## 2021-07-11 ENCOUNTER — Other Ambulatory Visit: Payer: Self-pay

## 2021-07-11 ENCOUNTER — Encounter (HOSPITAL_COMMUNITY): Payer: Self-pay

## 2021-07-11 ENCOUNTER — Emergency Department (HOSPITAL_COMMUNITY): Payer: 59

## 2021-07-11 ENCOUNTER — Emergency Department (HOSPITAL_COMMUNITY)
Admission: EM | Admit: 2021-07-11 | Discharge: 2021-07-11 | Disposition: A | Discharge status: Home / Self Care | Payer: 59 | Attending: Emergency Medicine | Admitting: Emergency Medicine

## 2021-07-11 DIAGNOSIS — R112 Nausea with vomiting, unspecified: Secondary | ICD-10-CM | POA: Diagnosis not present

## 2021-07-11 DIAGNOSIS — Z87891 Personal history of nicotine dependence: Secondary | ICD-10-CM | POA: Diagnosis not present

## 2021-07-11 DIAGNOSIS — R519 Headache, unspecified: Secondary | ICD-10-CM | POA: Diagnosis not present

## 2021-07-11 DIAGNOSIS — Z7982 Long term (current) use of aspirin: Secondary | ICD-10-CM | POA: Insufficient documentation

## 2021-07-11 DIAGNOSIS — I129 Hypertensive chronic kidney disease with stage 1 through stage 4 chronic kidney disease, or unspecified chronic kidney disease: Secondary | ICD-10-CM | POA: Diagnosis not present

## 2021-07-11 DIAGNOSIS — N183 Chronic kidney disease, stage 3 unspecified: Secondary | ICD-10-CM | POA: Diagnosis not present

## 2021-07-11 DIAGNOSIS — R202 Paresthesia of skin: Secondary | ICD-10-CM | POA: Diagnosis present

## 2021-07-11 DIAGNOSIS — Z79899 Other long term (current) drug therapy: Secondary | ICD-10-CM | POA: Insufficient documentation

## 2021-07-11 DIAGNOSIS — Z91040 Latex allergy status: Secondary | ICD-10-CM | POA: Diagnosis not present

## 2021-07-11 DIAGNOSIS — E1122 Type 2 diabetes mellitus with diabetic chronic kidney disease: Secondary | ICD-10-CM | POA: Diagnosis not present

## 2021-07-11 DIAGNOSIS — Z794 Long term (current) use of insulin: Secondary | ICD-10-CM | POA: Diagnosis not present

## 2021-07-11 DIAGNOSIS — I2510 Atherosclerotic heart disease of native coronary artery without angina pectoris: Secondary | ICD-10-CM | POA: Insufficient documentation

## 2021-07-11 DIAGNOSIS — Z20822 Contact with and (suspected) exposure to covid-19: Secondary | ICD-10-CM | POA: Insufficient documentation

## 2021-07-11 DIAGNOSIS — R531 Weakness: Secondary | ICD-10-CM | POA: Insufficient documentation

## 2021-07-11 DIAGNOSIS — Z955 Presence of coronary angioplasty implant and graft: Secondary | ICD-10-CM | POA: Diagnosis not present

## 2021-07-11 LAB — COMPREHENSIVE METABOLIC PANEL
ALT: 48 U/L — ABNORMAL HIGH (ref 0–44)
AST: 31 U/L (ref 15–41)
Albumin: 3.8 g/dL (ref 3.5–5.0)
Alkaline Phosphatase: 63 U/L (ref 38–126)
Anion gap: 8 (ref 5–15)
BUN: 10 mg/dL (ref 6–20)
CO2: 29 mmol/L (ref 22–32)
Calcium: 8.8 mg/dL — ABNORMAL LOW (ref 8.9–10.3)
Chloride: 98 mmol/L (ref 98–111)
Creatinine, Ser: 1.34 mg/dL — ABNORMAL HIGH (ref 0.61–1.24)
GFR, Estimated: >60 mL/min (ref >60)
Glucose, Bld: 151 mg/dL — ABNORMAL HIGH (ref 70–99)
Potassium: 3.5 mmol/L (ref 3.5–5.1)
Sodium: 135 mmol/L (ref 135–145)
Total Bilirubin: 0.3 mg/dL (ref 0.3–1.2)
Total Protein: 7.1 g/dL (ref 6.5–8.1)

## 2021-07-11 LAB — DIFFERENTIAL
Abs Immature Granulocytes: 0.04 10*3/uL (ref 0.00–0.07)
Basophils Absolute: 0 10*3/uL (ref 0.0–0.1)
Basophils Relative: 1 %
Eosinophils Absolute: 0.1 10*3/uL (ref 0.0–0.5)
Eosinophils Relative: 1 %
Immature Granulocytes: 1 %
Lymphocytes Relative: 23 %
Lymphs Abs: 2 10*3/uL (ref 0.7–4.0)
Monocytes Absolute: 0.6 10*3/uL (ref 0.1–1.0)
Monocytes Relative: 7 %
Neutro Abs: 6.1 10*3/uL (ref 1.7–7.7)
Neutrophils Relative %: 67 %

## 2021-07-11 LAB — CBC
HCT: 42.6 % (ref 39.0–52.0)
Hemoglobin: 14.7 g/dL (ref 13.0–17.0)
MCH: 32 pg (ref 26.0–34.0)
MCHC: 34.5 g/dL (ref 30.0–36.0)
MCV: 92.8 fL (ref 80.0–100.0)
Platelets: 211 10*3/uL (ref 150–400)
RBC: 4.59 MIL/uL (ref 4.22–5.81)
RDW: 12.4 % (ref 11.5–15.5)
WBC: 8.9 10*3/uL (ref 4.0–10.5)
nRBC: 0 % (ref 0.0–0.2)

## 2021-07-11 LAB — RAPID URINE DRUG SCREEN, HOSP PERFORMED
Amphetamines: NOT DETECTED
Barbiturates: NOT DETECTED
Benzodiazepines: POSITIVE — AB
Cocaine: NOT DETECTED
Opiates: NOT DETECTED
Tetrahydrocannabinol: NOT DETECTED

## 2021-07-11 LAB — ETHANOL: Alcohol, Ethyl (B): <10 mg/dL (ref <10)

## 2021-07-11 LAB — URINALYSIS, ROUTINE W REFLEX MICROSCOPIC
Bacteria, UA: NONE SEEN
Bilirubin Urine: NEGATIVE
Glucose, UA: NEGATIVE mg/dL
Ketones, ur: NEGATIVE mg/dL
Leukocytes,Ua: NEGATIVE
Nitrite: NEGATIVE
Protein, ur: 30 mg/dL — AB
Specific Gravity, Urine: 1.017 (ref 1.005–1.030)
pH: 5 (ref 5.0–8.0)

## 2021-07-11 LAB — PROTIME-INR
INR: 1 (ref 0.8–1.2)
Prothrombin Time: 13.5 seconds (ref 11.4–15.2)

## 2021-07-11 LAB — APTT: aPTT: 29 seconds (ref 24–36)

## 2021-07-11 LAB — RESP PANEL BY RT-PCR (FLU A&B, COVID) ARPGX2
Influenza A by PCR: NEGATIVE
Influenza B by PCR: NEGATIVE
SARS Coronavirus 2 by RT PCR: NEGATIVE

## 2021-07-11 MED ORDER — KETOROLAC TROMETHAMINE 15 MG/ML IJ SOLN
15.0000 mg | Freq: Once | INTRAMUSCULAR | Status: AC
  Administered 2021-07-11: 15 mg via INTRAVENOUS
  Filled 2021-07-11: qty 1

## 2021-07-11 MED ORDER — ACETAMINOPHEN 500 MG PO TABS
1000.0000 mg | ORAL_TABLET | Freq: Four times a day (QID) | ORAL | Status: DC | PRN
  Administered 2021-07-11: 1000 mg via ORAL
  Filled 2021-07-11: qty 2

## 2021-07-11 NOTE — ED Provider Notes (Signed)
[facility] EMERGENCY DEPARTMENT Provider Note   CSN: [id number] Arrival date & time: 07/11/21  1807     History Chief Complaint  Patient presents with   Numbness   Nausea    Elijah Watkins is a 55 y.o. male.  HPI  55 year old male PMHx CAD with prior MI, IDDM, HTN, HLD, CKD, p/w headache x3d.  Headache started while he was at work but no clear trigger, sudden onset, is located in occipital region, pressure-like quality, no modifying factors. A/w b/l intermittent ringing tinnitus, latest episode lasting past couple hours, nausea w few episodes vomiting yesterday (seen at [facility] Wednesday, dx w stomach bug), onset of intermittent lue tingling that has since resolved earlier today. Notes that he had diffuse left-sided paresthesias and weakness last week, speech deficits, that resolved over hours, ultimately dx w tia, started on DAPT.  No further acute medical concern at this time, including fevers, chills, sweats, sore throat, rhinorrhea, shortness of breath, cough, CP, pedal edema, palpitations, bladder changes, audiovisual changes.  History obtained from patient, wife, chart review.  Past Medical History:  Diagnosis Date   Anger    anger issues   Anxiety    Arthritis    CAD (coronary artery disease)    Dental crowns present    Diabetes mellitus    IDDM - fasting 70-120s   GERD (gastroesophageal reflux disease)    History of concussion    History of DVT (deep vein thrombosis) 03/2005   History of peptic ulcer age 77   Hyperlipidemia    Hypertension    under control, has been on med. > 7 yrs.   MI (myocardial infarction) (HCC)    Myocardial infarction (HCC)    stents   Nephrolithiasis    states has 3 stones in kidney, but no current problems   Overweight(278.02)    Pilonidal cyst 05/2012   area is open, not draining   Pneumonia    hx   PONV (postoperative nausea and vomiting)    S/P coronary artery stent placement 2001, 05/2011   Seasonal allergies      Patient Active Problem List   Diagnosis Date Noted   TIA (transient ischemic attack) 07/03/2021   Obesity, Class III, BMI 40-49.9 (morbid obesity) (HCC) 09/02/2018   S/P angioplasty with stent 08/31/2018   Dyslipidemia, goal LDL below 70 08/20/2018   GERD (gastroesophageal reflux disease) 08/20/2018   CKD (chronic kidney disease), stage III (HCC) 08/20/2018   Anxiety 08/20/2018   Unstable angina (HCC)    Gallstones 08/02/2013   Type II diabetes mellitus with renal manifestations (HCC) 02/24/2012   Myocardial infarct, old 02/24/2012   Coronary artery disease 02/24/2012   Hypertension 02/24/2012   Gout 02/24/2012   Obesity (BMI 30-39.9) 02/24/2012   Depression 02/24/2012   Pilonidal abscess 02/24/2012   PAIN IN LIMB 01/01/2011    Past Surgical History:  Procedure Laterality Date   CARDIAC CATHETERIZATION  10/06/2010   CARPAL TUNNEL RELEASE Right    CHOLECYSTECTOMY N/A 08/18/2013   Procedure: LAPAROSCOPIC CHOLECYSTECTOMY WITH INTRAOPERATIVE CHOLANGIOGRAM;  Surgeon: Robyne Askew, MD;  Location: [facility] OR;  Service: General;  Laterality: N/A;   CHONDROPLASTY  08/04/2004   right knee   CORONARY ANGIOPLASTY WITH STENT PLACEMENT     x 2; last time 05/2011   CORONARY STENT INTERVENTION N/A 08/22/2018   Procedure: CORONARY STENT INTERVENTION;  Surgeon: Corky Crafts, MD;  Location: [facility] INVASIVE CV LAB;  Service: Cardiovascular;  Laterality: N/A;   DISTAL BICEPS TENDON  REPAIR Left 02/19/2016   Procedure: DISTAL BICEPS TENDON REPAIR;  Surgeon: Jodi Geralds, MD;  Location: [facility];  Service: Orthopedics;  Laterality: Left;   KIDNEY STONE SURGERY  1988 - 1998   x 3   knee arthroscopic     left knee   KNEE CARTILAGE SURGERY  10/2003, 11/2003   Also had bursa removed   LEFT HEART CATH AND CORONARY ANGIOGRAPHY N/A 08/22/2018   Procedure: LEFT HEART CATH AND CORONARY ANGIOGRAPHY;  Surgeon: Corky Crafts, MD;  Location: [facility] INVASIVE CV LAB;  Service: Cardiovascular;   Laterality: N/A;   PILONIDAL CYST DRAINAGE  05/19/2012   Procedure: IRRIGATION AND DEBRIDEMENT PILONIDAL CYST;  Surgeon: Robyne Askew, MD;  Location: [facility];  Service: General;  Laterality: N/A;  I & D Pilonidal abscess, placement of acell   PREPATELLAR BURSA EXCISION  12/05/2004   right   WRIST SURGERY  02/1997   right       Family History  Problem Relation Age of Onset   Diabetes Mother 38   Hypertension Father    Alcohol abuse Father 56    Social History   Tobacco Use   Smoking status: Former    Types: Cigarettes    Quit date: 02/24/1984    Years since quitting: 37.4   Smokeless tobacco: Never  Substance Use Topics   Alcohol use: Yes    Comment: rare   Drug use: No    Home Medications Prior to Admission medications   Medication Sig Start Date End Date Taking? Authorizing Provider  allopurinol (ZYLOPRIM) 300 MG tablet Take 300 mg by mouth daily.    [provider]  ALPRAZolam Prudy Feeler) 1 MG tablet Take 1-2 mg by mouth 2 (two) times daily as needed for anxiety.    [provider]  aspirin 81 MG chewable tablet Chew 1 tablet (81 mg total) by mouth daily for 21 days. 07/03/21 07/24/21  Rhetta Mura, MD  atorvastatin (LIPITOR) 80 MG tablet Take 80 mg by mouth daily.    [provider]  clopidogrel (PLAVIX) 75 MG tablet Take 1 tablet (75 mg total) by mouth daily. 07/04/21   Rhetta Mura, MD  gabapentin (NEURONTIN) 600 MG tablet Take 600 mg by mouth 2 (two) times daily.    [provider]  insulin NPH-regular Human (NOVOLIN 70/30) (70-30) 100 UNIT/ML injection Inject 80 Units into the skin 2 (two) times daily with a meal.    [provider]  lisinopril (PRINIVIL,ZESTRIL) 5 MG tablet Take 5 mg by mouth daily.    [provider]  metoprolol (LOPRESSOR) 50 MG tablet Take 50 mg by mouth 2 (two) times daily.    [provider]  nitroGLYCERIN (NITROSTAT) 0.4 MG SL tablet Place 0.4 mg under  the tongue every 5 (five) minutes as needed. x3 doses as needed for chest pain    [provider]  omeprazole (PRILOSEC) 20 MG capsule Take 20 mg by mouth 2 (two) times daily before a meal.    [provider]  zolpidem (AMBIEN) 10 MG tablet Take 10 mg by mouth at bedtime.     [provider]    Allergies    Latex  Review of Systems   Review of Systems  All other systems reviewed and are negative.  Physical Exam Updated Vital Signs BP 122/63   Pulse 64   Temp 98.4 F (36.9 C) (Oral)   Resp 17   Ht 5\' 11"  (1.803 m)  Wt 133.8 kg   SpO2 96%   BMI 41.14 kg/m   Physical Exam Vitals and nursing note reviewed.  Constitutional:      General: He is not in acute distress. HENT:     Head: Normocephalic and atraumatic.     Nose: Nose normal.     Mouth/Throat:     Mouth: Mucous membranes are moist.     Pharynx: Oropharynx is clear.  Eyes:     Extraocular Movements: Extraocular movements intact.     Conjunctiva/sclera: Conjunctivae normal.     Pupils: Pupils are equal, round, and reactive to light.  Cardiovascular:     Rate and Rhythm: Normal rate and regular rhythm.     Heart sounds: Murmur heard.    No friction rub. No gallop.  Pulmonary:     Effort: Pulmonary effort is normal.     Breath sounds: No stridor. No wheezing, rhonchi or rales.  Abdominal:     General: There is no distension.     Palpations: Abdomen is soft.     Tenderness: There is no abdominal tenderness. There is no guarding or rebound.  Musculoskeletal:     Cervical back: Normal range of motion. No rigidity.     Right lower leg: No edema.     Left lower leg: No edema.  Skin:    General: Skin is warm and dry.  Neurological:     Mental Status: He is alert and oriented to person, place, and time.     Comments: Mental status: a&o x4 Speech: clear, no dysarthria CN II: visual fields grossly intact CN III/IV/VI: PERRL, EOMI CN V: facial sensation to LT and mastication intact CN  VII: no facial droop CN VIII: no nystagmus, audition intact to finger rub VN IX/X: swallow intact CN XI: trapezius and SCM motor function intact CN XII: midline tongue w/o atrophy or fasciculation RUE: 5/5 strength, sensation to LT intact LUE: 5/5 strength, sensation to LT intact RLE: 5/5 strength, sensation to LT intact LLE: 5/5 strength, sensation to LT intact Coordination: finger-to-nose, foot alternation intact. No pronator drift or dysdiadochokinesia Gait: Untested   Psychiatric:        Mood and Affect: Mood normal.        Behavior: Behavior normal.    ED Results / Procedures / Treatments   Labs (all labs ordered are listed, but only abnormal results are displayed) Labs Reviewed  COMPREHENSIVE METABOLIC PANEL - Abnormal; Notable for the following components:      Result Value   Glucose, Bld 151 (*)    Creatinine, Ser 1.34 (*)    Calcium 8.8 (*)    ALT 48 (*)    All other components within normal limits  RAPID URINE DRUG SCREEN, HOSP PERFORMED - Abnormal; Notable for the following components:   Benzodiazepines POSITIVE (*)    All other components within normal limits  URINALYSIS, ROUTINE W REFLEX MICROSCOPIC - Abnormal; Notable for the following components:   Hgb urine dipstick SMALL (*)    Protein, ur 30 (*)    All other components within normal limits  RESP PANEL BY RT-PCR (FLU A&B, COVID) ARPGX2  ETHANOL  PROTIME-INR  APTT  CBC  DIFFERENTIAL    EKG None  Radiology CT HEAD WO CONTRAST  Result Date: 07/11/2021 CLINICAL DATA:  TIA.  Left upper extremity numbness. EXAM: CT HEAD WITHOUT CONTRAST TECHNIQUE: Contiguous axial images were obtained from the base of the skull through the vertex without intravenous contrast. COMPARISON:  07/03/2021 FINDINGS: Brain: There is  atrophy and chronic small vessel disease changes. Old bilateral basal ganglia lacunar infarcts. No acute intracranial abnormality. Specifically, no hemorrhage, hydrocephalus, mass lesion, acute  infarction, or significant intracranial injury. Vascular: No hyperdense vessel or unexpected calcification. Skull: No acute calvarial abnormality. Sinuses/Orbits: Complete opacification of the right maxillary sinus and adjacent ethmoid air cells as well as right frontal sinus. These findings are stable since prior study. Other: None IMPRESSION: Atrophy, chronic microvascular disease. No acute intracranial abnormality. Stable diffuse right sinusitis. Electronically Signed   By: Charlett NoseKevin  Dover M.D.   On: 07/11/2021 19:09    Procedures Procedures   Medications Ordered in ED Medications  acetaminophen (TYLENOL) tablet 1,000 mg (1,000 mg Oral Given 07/11/21 2314)  ketorolac (TORADOL) 15 MG/ML injection 15 mg (15 mg Intravenous Given 07/11/21 2314)    ED Course  I have reviewed the triage vital signs and the nursing notes.  Pertinent labs & imaging results that were available during my care of the patient were reviewed by me and considered in my medical decision making (see chart for details).    MDM Rules/Calculators/A&P                         This is a 55 year old male with multiple cardiovascular comorbidities and TIA diagnosed last week, presenting for LUE paresthesias and weakness that began today, as well as an occipital headache.  On exam, VSS, AF, neurologically intact with full strength.  Initial interventions: Tylenol and Toradol provided for headache with improvement  All studies independently reviewed by myself, d/w the attending physician, factored into my MDM. -EKG: NSR 75 bpm, normal axis, normal intervals, no acute ST changes, essentially unchanged compared to prior from 07/04/2021 -CT head: No acute intracranial abnormality, stable right sinusitis -Unremarkable: EtOH, PT/PTT, CBCd, CMP, UA, COVID/influenza PCR, UDS  Presentation appears most consistent with TIA versus brief peripheral neuropathy.  Resolution of symptoms suggests against CVA or watershed infarct.  No CT evidence of  ICH or space-occupying lesion.  No tearing chest or abdominal pain with posterior radiation.  No characteristic semiology or postictal phase to suggest seizure or Todd's paralysis.  Patient is afebrile without photophobia or neck stiffness.  No electrolyte derangements noted on metabolic panel.  No concerning exposures.  No recent infection.  Less likely migraine or conversion disorder given lack of history thereof.  Discussed with neurology, Dr. Wilford CornerArora, who advised that patient had appropriate work-up performed last week and is already on appropriate treatment, specifically DAPT.  Recommended moving outpatient follow-up in the neurology clinic sooner to within the next 2 weeks.  Patient otherwise stable, feel he is safe for discharge home.  Additionally recommended that he follow-up with his PCP early next week for reevaluation.  Strict return precautions discussed.  Patient family understand agree with plan.  Patient HDS, nontoxic on reevaluation.  Subsequently discharged.  Final Clinical Impression(s) / ED Diagnoses Final diagnoses:  Paresthesia  Acute nonintractable headache, unspecified headache type    Rx / DC Orders ED Discharge Orders          Ordered    Ambulatory referral to Neurology       Comments: An appointment is requested in approximately: 2 weeks; discussed w/ Dr. Wilford CornerArora, advised moving neuro appt sooner to within the next 2 weeks   07/11/21 2244             Colvin Carolihandrasekar, Tyland Klemens, MD 07/12/21 [id number]    Rolan BuccoBelfi, Melanie, MD 07/12/21 1100

## 2021-07-11 NOTE — ED Provider Notes (Signed)
Emergency Medicine Provider Triage Evaluation Note  Elijah Watkins , a 55 y.o. male  was evaluated in triage.  Pt complains of headache for several days and nausea. Today experienced intermittent left arm tingling. States sxs currently resolved. No weakness or vision changes.  Review of Systems  Positive: Tingling, headache Negative: Chest pain  Physical Exam  BP (!) 178/98   Pulse 77   Temp 99.1 F (37.3 C) (Oral)   Resp 18   SpO2 96%  Gen:   Awake, no distress   Resp:  Normal effort  MSK:   Moves extremities without difficulty  Other:  Clear speech, no facial droop, 5/5 strength to the bue/ble  Medical Decision Making  Medically screening exam initiated at 6:13 PM.  Appropriate orders placed.  Ezequiel Ganser was informed that the remainder of the evaluation will be completed by another provider, this initial triage assessment does not replace that evaluation, and the importance of remaining in the ED until their evaluation is complete.     Karrie Meres, PA-C 07/11/21 1819    Gwyneth Sprout, MD 07/11/21 2231

## 2021-07-11 NOTE — ED Triage Notes (Signed)
Pt from home with ems for 1 week of nausea, intermittent left arm tingling and headache. Pt had a TIA last week. Pt given 4mg  zofran en route without relief of nausea. No weakness or stroke symptoms noted in triage.

## 2021-07-11 NOTE — Discharge Instructions (Signed)
Your evaluated in the ER for your headache and numbness/weakness to your left arm.  Our evaluation was reassuring against a significantly threatening pathology.  We discussed with the neurology service, who recommended they should follow-up sooner in their clinic, in the next 2 weeks.  You should additionally follow-up with your primary care doctor in the next couple days for reevaluation further management.  If you do not have 1, you can use the number above for [facility] community health and wellness.  Please return to the ER for any worsening, including new numbness/tingling/weakness in 1 arm or 1 leg that will not go away, significant vision changes, severe headaches, fainting episodes.

## 2021-07-11 NOTE — ED Notes (Signed)
Provider at bedside

## 2022-03-31 NOTE — Progress Notes (Signed)
Sent message, via epic in basket, requesting orders in epic from surgeon.  

## 2022-04-01 ENCOUNTER — Ambulatory Visit: Payer: Self-pay | Admitting: Orthopedic Surgery

## 2022-04-03 ENCOUNTER — Ambulatory Visit: Payer: Self-pay | Admitting: Orthopedic Surgery

## 2022-04-03 ENCOUNTER — Encounter (HOSPITAL_COMMUNITY): Payer: Self-pay

## 2022-04-03 NOTE — Patient Instructions (Signed)
2 VISITORS ARE ALLOWED TO COME WITH YOU AND STAY IN THE WAITING ROOM ONLY DURING PRE OP AND PROCEDURE.   ? ?**NO VISITORS ARE ALLOWED IN THE SHORT STAY AREA OR RECOVERY ROOM!!** ? ?IF YOU WILL BE ADMITTED INTO THE HOSPITAL YOU ARE ALLOWED 4 SUPPORT PEOPLE DURING VISITATION HOURS ONLY (7 AM -8PM)   ?The support person(s) must pass our screening, gel in and out, and wear a mask at all times, including in the patient?s room. ?Patients must also wear a mask when staff or their support person are in the room. ?Visitors GUEST BADGE MUST BE WORN VISIBLY  ?One adult visitor may remain with you overnight and MUST be in the room by 8 P.M. ?  ? ? Your procedure is scheduled on: 04/13/22 ? ? Report to [facility] Main Entrance ? ?  Report to admitting at 10:30 AM ? ? Call this number if you have problems the morning of surgery [phone] ? ? Do not eat food :After Midnight. ? ? After Midnight you may have the following liquids until _10:00 AM DAY OF SURGERY ? ?Water ?Black Coffee (sugar ok, NO MILK/CREAM OR CREAMERS)  ?Tea (sugar ok, NO MILK/CREAM OR CREAMERS) regular and decaf                             ?Plain Jell-O (NO RED)                                           ?Fruit ices (not with fruit pulp, NO RED)                                     ?Popsicles (NO RED)                                                                  ?Juice: apple, WHITE grape, WHITE cranberry ?Sports drinks like Gatorade (NO RED) ?Clear broth(vegetable,chicken,beef) ? ?             ? ?  ?  ?The day of surgery:  ?Drink ONE (1)  G2 at 9:45 AM the morning of surgery. Drink in one sitting. Do not sip.  ?This drink was given to you during your hospital  ?pre-op appointment visit. ?Nothing else to drink after completing the  ?G2.at 10:00 am ?  ?       If you have questions, please contact your surgeon?s office. ? ? ?  ?  ?Oral Hygiene is also important to reduce your risk of infection.                                    ?Remember - BRUSH YOUR  TEETH THE MORNING OF SURGERY WITH YOUR REGULAR TOOTHPASTE ? ? Do NOT smoke after Midnight ? ? Take these medicines the morning of surgery with A SIP OF WATER: Gabapentin, Metoprolol, Omeprazole ?How to Manage Your Diabetes ?Before and After Surgery ? ?Why is it important  to control my blood sugar before and after surgery? ?Improving blood sugar levels before and after surgery helps healing and can limit problems. ?A way of improving blood sugar control is eating a healthy diet by: ? Eating less sugar and carbohydrates ? Increasing activity/exercise ? Talking with your doctor about reaching your blood sugar goals ?High blood sugars (greater than 180 mg/dL) can raise your risk of infections and slow your recovery, so you will need to focus on controlling your diabetes during the weeks before surgery. ?Make sure that the doctor who takes care of your diabetes knows about your planned surgery including the date and location. ? ?How do I manage my blood sugar before surgery? ?Check your blood sugar at least 4 times a day, starting 2 days before surgery, to make sure that the level is not too high or low. ?Check your blood sugar the morning of your surgery when you wake up and every 2 hours until you get to the Short Stay unit. ?If your blood sugar is less than 70 mg/dL, you will need to treat for low blood sugar: ?Do not take insulin. ?Treat a low blood sugar (less than 70 mg/dL) with ? cup of clear juice (cranberry or apple), 4 glucose tablets, OR glucose gel. ?Recheck blood sugar in 15 minutes after treatment (to make sure it is greater than 70 mg/dL). If your blood sugar is not greater than 70 mg/dL on recheck, call [phone] for further instructions. ?Report your blood sugar to the short stay nurse when you get to Short Stay. ? ?If you are admitted to the hospital after surgery: ?Your blood sugar will be checked by the staff and you will probably be given insulin after surgery (instead of oral diabetes medicines)  to make sure you have good blood sugar levels. ?The goal for blood sugar control after surgery is 80-180 mg/dL. ? ? ?WHAT DO I DO ABOUT MY DIABETES MEDICATION? ? ?Do not take oral diabetes medicines (pills) the morning of surgery. ? ?THE NIGHT BEFORE SURGERY, take  56   units of   70/30    insulin.     ? ? ?THE MORNING OF SURGERY, take  NO   insulin. ? ? ?    ? ?Bring CPAP mask and tubing day of surgery. ?                  ?           You may not have any metal on your body including  jewelry, and body piercing ? ?           Do not wear lotions, powders, perfumes/cologne, or deodorant ? ?            Men may shave face and neck. ? ? Do not bring valuables to the hospital. [facility] IS NOT ?            RESPONSIBLE   FOR VALUABLES. ? ? Contacts, dentures or bridgework may not be worn into surgery. ? ? Bring small overnight bag day of surgery. ?  ? ? Special Instructions: Bring a copy of your healthcare power of attorney and living will documents   the day of surgery if you haven't scanned them before. ? ?            Please read over the following fact sheets you were given: IF YOU HAVE QUESTIONS ABOUT YOUR PRE-OP INSTRUCTIONS PLEASE CALL [phone] ? ?   [place] - Preparing for  Surgery ?Before surgery, you can play an important role.  Because skin is not sterile, your skin needs to be as free of germs as possible.  You can reduce the number of germs on your skin by washing with CHG (chlorahexidine gluconate) soap before surgery.  CHG is an antiseptic cleaner which kills germs and bonds with the skin to continue killing germs even after washing. ?Please DO NOT use if you have an allergy to CHG or antibacterial soaps.  If your skin becomes reddened/irritated stop using the CHG and inform your nurse when you arrive at Short Stay.  You may shave your face/neck. ?Please follow these instructions carefully: ? 1.  Shower with CHG Soap the night before surgery and the  morning of Surgery. ? 2.  If you choose to wash  your hair, wash your hair first as usual with your  normal  shampoo. ? 3.  After you shampoo, rinse your hair and body thoroughly to remove the  shampoo.                ? 4.  Use CHG as you would any other liquid soap.  You can apply chg directly  to the skin and wash  ?                     Gently with a scrungie or clean washcloth. ? 5.  Apply the CHG Soap to your body ONLY FROM THE NECK DOWN.   Do not use on face/ open      ?                     Wound or open sores. Avoid contact with eyes, ears mouth and genitals (private parts).  ?                     Engineering geologistWash face,  Genitals (private parts) with your normal soap. ?            6.  Wash thoroughly, paying special attention to the area where your surgery  will be performed. ? 7.  Thoroughly rinse your body with warm water from the neck down. ? 8.  DO NOT shower/wash with your normal soap after using and rinsing off  the CHG Soap. ?               9.  Pat yourself dry with a clean towel. ?           10.  Wear clean pajamas. ?           11.  Place clean sheets on your bed the night of your first shower and do not  sleep with pets. ?Day of Surgery : ?Do not apply any lotions/deodorants the morning of surgery.  Please wear clean clothes to the hospital/surgery center. ? ?FAILURE TO FOLLOW THESE INSTRUCTIONS MAY RESULT IN THE CANCELLATION OF YOUR SURGERY ? ? ?N ?________________________________________________________________________  ? ?Incentive Spirometer ? ?An incentive spirometer is a tool that can help keep your lungs clear and active. This tool measures how well you are filling your lungs with each breath. Taking long deep breaths may help reverse or decrease the chance of developing breathing (pulmonary) problems (especially infection) following: ?A long period of time when you are unable to move or be active. ?BEFORE THE PROCEDURE  ?If the spirometer includes an indicator to show your best effort, your nurse or respiratory therapist will set it to a desired  goal. ?  If possible, sit up straight or lean slightly forward. Try not to slouch. ?Hold the incentive spirometer in an upright position. ?INSTRUCTIONS FOR USE  ?Sit on the edge of your bed if possible, or sit up

## 2022-04-07 ENCOUNTER — Other Ambulatory Visit: Payer: Self-pay

## 2022-04-07 ENCOUNTER — Encounter (HOSPITAL_COMMUNITY): Payer: Self-pay

## 2022-04-07 ENCOUNTER — Encounter (HOSPITAL_COMMUNITY)
Admission: RE | Admit: 2022-04-07 | Discharge: 2022-04-07 | Disposition: A | Discharge status: Home / Self Care | Payer: BC Managed Care – PPO | Source: Ambulatory Visit | Attending: Specialist | Admitting: Specialist

## 2022-04-07 VITALS — BP 134/83 | HR 65 | Temp 98.2°F | Resp 18 | Ht 71.0 in | Wt 296.0 lb

## 2022-04-07 DIAGNOSIS — Z01812 Encounter for preprocedural laboratory examination: Secondary | ICD-10-CM | POA: Diagnosis present

## 2022-04-07 DIAGNOSIS — E119 Type 2 diabetes mellitus without complications: Secondary | ICD-10-CM | POA: Insufficient documentation

## 2022-04-07 DIAGNOSIS — Z794 Long term (current) use of insulin: Secondary | ICD-10-CM | POA: Diagnosis not present

## 2022-04-07 DIAGNOSIS — Z01818 Encounter for other preprocedural examination: Secondary | ICD-10-CM

## 2022-04-07 HISTORY — DX: Personal history of urinary calculi: Z87.442

## 2022-04-07 LAB — CBC
HCT: 43.2 % (ref 39.0–52.0)
Hemoglobin: 14.6 g/dL (ref 13.0–17.0)
MCH: 31.5 pg (ref 26.0–34.0)
MCHC: 33.8 g/dL (ref 30.0–36.0)
MCV: 93.1 fL (ref 80.0–100.0)
Platelets: 188 10*3/uL (ref 150–400)
RBC: 4.64 MIL/uL (ref 4.22–5.81)
RDW: 12.7 % (ref 11.5–15.5)
WBC: 7.3 10*3/uL (ref 4.0–10.5)
nRBC: 0 % (ref 0.0–0.2)

## 2022-04-07 LAB — SURGICAL PCR SCREEN
MRSA, PCR: NEGATIVE
Staphylococcus aureus: NEGATIVE

## 2022-04-07 LAB — BASIC METABOLIC PANEL
Anion gap: 8 (ref 5–15)
BUN: 13 mg/dL (ref 6–20)
CO2: 29 mmol/L (ref 22–32)
Calcium: 8.8 mg/dL — ABNORMAL LOW (ref 8.9–10.3)
Chloride: 103 mmol/L (ref 98–111)
Creatinine, Ser: 1.28 mg/dL — ABNORMAL HIGH (ref 0.61–1.24)
GFR, Estimated: >60 mL/min (ref >60)
Glucose, Bld: 98 mg/dL (ref 70–99)
Potassium: 4.3 mmol/L (ref 3.5–5.1)
Sodium: 140 mmol/L (ref 135–145)

## 2022-04-07 LAB — HEMOGLOBIN A1C
Hgb A1c MFr Bld: 6.8 % — ABNORMAL HIGH (ref 4.8–5.6)
Mean Plasma Glucose: 148.46 mg/dL

## 2022-04-07 LAB — GLUCOSE, CAPILLARY: Glucose-Capillary: 96 mg/dL (ref 70–99)

## 2022-04-07 NOTE — Progress Notes (Addendum)
Anesthesia note: ? ?Bowel prep reminder:NA ? ?PCP - Lodema Pilot PA ?Cardiologist -Dr.Z. Junagadhwalla ?Other-  ? ?Chest x-ray - no ?EKG - 07/11/21-chart ?Stress Test - 03/03/22-CE ?ECHO - 2018-CE ?Cardiac Cath - 2011,2012,2019 with stents ? ?Pacemaker/ICD device last checked:NA ? ?Sleep Study - no STOP BANG score was 7. Pt's PCP knows he is having a hard time setting up the study due to work hours ?CPAP - no ? ?Fasting Blood Sugar - Pt doesn't test ?Checks Blood Sugar _____ ? ?Blood Thinner:Plavix ?Blood Thinner Instructions:stop 7 days prior to DOS/ Dr. Sharol Roussel ?Aspirin Instructions: ?Last Dose:04/04/22 ? ?Anesthesia review: yes ? ?Patient denies shortness of breath, fever, cough and chest pain at PAT appointment ?Pt's BMI is 41.28. climbing stairs is difficult. He takes Gabapentin and xanax to help control his anger issues and temper. ? ?Patient verbalized understanding of instructions that were given to them at the PAT appointment. Patient was also instructed that they will need to review over the PAT instructions again at home before surgery. yes ?

## 2022-04-13 ENCOUNTER — Encounter (HOSPITAL_COMMUNITY)
Admission: RE | Disposition: A | Discharge status: Home / Self Care | Payer: Self-pay | Source: Ambulatory Visit | Attending: Specialist

## 2022-04-13 ENCOUNTER — Ambulatory Visit (HOSPITAL_COMMUNITY): Payer: BC Managed Care – PPO | Admitting: Certified Registered"

## 2022-04-13 ENCOUNTER — Encounter (HOSPITAL_COMMUNITY): Payer: Self-pay | Admitting: Specialist

## 2022-04-13 ENCOUNTER — Ambulatory Visit: Payer: Self-pay | Admitting: Orthopedic Surgery

## 2022-04-13 ENCOUNTER — Observation Stay (HOSPITAL_COMMUNITY)
Admission: RE | Admit: 2022-04-13 | Discharge: 2022-04-16 | Disposition: A | Discharge status: Home / Self Care | Payer: BC Managed Care – PPO | Source: Ambulatory Visit | Attending: Specialist | Admitting: Specialist

## 2022-04-13 ENCOUNTER — Other Ambulatory Visit: Payer: Self-pay

## 2022-04-13 ENCOUNTER — Ambulatory Visit (HOSPITAL_COMMUNITY): Payer: BC Managed Care – PPO

## 2022-04-13 DIAGNOSIS — E119 Type 2 diabetes mellitus without complications: Secondary | ICD-10-CM | POA: Diagnosis not present

## 2022-04-13 DIAGNOSIS — M1711 Unilateral primary osteoarthritis, right knee: Principal | ICD-10-CM | POA: Insufficient documentation

## 2022-04-13 DIAGNOSIS — M1712 Unilateral primary osteoarthritis, left knee: Secondary | ICD-10-CM | POA: Diagnosis not present

## 2022-04-13 DIAGNOSIS — Z794 Long term (current) use of insulin: Secondary | ICD-10-CM | POA: Diagnosis not present

## 2022-04-13 DIAGNOSIS — I252 Old myocardial infarction: Secondary | ICD-10-CM | POA: Insufficient documentation

## 2022-04-13 DIAGNOSIS — Z955 Presence of coronary angioplasty implant and graft: Secondary | ICD-10-CM | POA: Diagnosis not present

## 2022-04-13 DIAGNOSIS — Z86718 Personal history of other venous thrombosis and embolism: Secondary | ICD-10-CM | POA: Insufficient documentation

## 2022-04-13 DIAGNOSIS — Z7984 Long term (current) use of oral hypoglycemic drugs: Secondary | ICD-10-CM | POA: Diagnosis not present

## 2022-04-13 DIAGNOSIS — Z79899 Other long term (current) drug therapy: Secondary | ICD-10-CM | POA: Insufficient documentation

## 2022-04-13 DIAGNOSIS — I2510 Atherosclerotic heart disease of native coronary artery without angina pectoris: Secondary | ICD-10-CM | POA: Diagnosis not present

## 2022-04-13 DIAGNOSIS — I10 Essential (primary) hypertension: Secondary | ICD-10-CM | POA: Insufficient documentation

## 2022-04-13 DIAGNOSIS — Z91040 Latex allergy status: Secondary | ICD-10-CM | POA: Diagnosis not present

## 2022-04-13 HISTORY — PX: INJECTION KNEE: SHX2446

## 2022-04-13 HISTORY — PX: TOTAL KNEE ARTHROPLASTY: SHX125

## 2022-04-13 LAB — GLUCOSE, CAPILLARY
Glucose-Capillary: 161 mg/dL — ABNORMAL HIGH (ref 70–99)
Glucose-Capillary: 277 mg/dL — ABNORMAL HIGH (ref 70–99)

## 2022-04-13 SURGERY — ARTHROPLASTY, KNEE, TOTAL
Anesthesia: Regional | Site: Knee | Laterality: Right

## 2022-04-13 MED ORDER — METOPROLOL TARTRATE 50 MG PO TABS
50.0000 mg | ORAL_TABLET | Freq: Two times a day (BID) | ORAL | Status: DC
  Administered 2022-04-13 – 2022-04-16 (×6): 50 mg via ORAL
  Filled 2022-04-13 (×6): qty 1

## 2022-04-13 MED ORDER — PROPOFOL 10 MG/ML IV BOLUS
INTRAVENOUS | Status: DC | PRN
  Administered 2022-04-13: 20 mg via INTRAVENOUS
  Administered 2022-04-13 (×2): 10 mg via INTRAVENOUS

## 2022-04-13 MED ORDER — COLCHICINE 0.6 MG PO TABS
0.6000 mg | ORAL_TABLET | Freq: Every day | ORAL | Status: AC
  Administered 2022-04-15: 0.6 mg via ORAL
  Filled 2022-04-13 (×3): qty 1

## 2022-04-13 MED ORDER — BUPIVACAINE-EPINEPHRINE (PF) 0.25% -1:200000 IJ SOLN
INTRAMUSCULAR | Status: AC
  Filled 2022-04-13: qty 30

## 2022-04-13 MED ORDER — KCL IN DEXTROSE-NACL 20-5-0.45 MEQ/L-%-% IV SOLN: INTRAVENOUS | Status: DC

## 2022-04-13 MED ORDER — KCL IN DEXTROSE-NACL 20-5-0.45 MEQ/L-%-% IV SOLN
INTRAVENOUS | Status: AC
  Filled 2022-04-13: qty 1000

## 2022-04-13 MED ORDER — LIDOCAINE HCL (PF) 1 % IJ SOLN
INTRAMUSCULAR | Status: AC
  Filled 2022-04-13: qty 30

## 2022-04-13 MED ORDER — BUPIVACAINE IN DEXTROSE 0.75-8.25 % IT SOLN
INTRATHECAL | Status: DC | PRN
  Administered 2022-04-13: 2 mL via INTRATHECAL

## 2022-04-13 MED ORDER — METHOCARBAMOL 500 MG PO TABS
500.0000 mg | ORAL_TABLET | Freq: Four times a day (QID) | ORAL | Status: DC | PRN
  Administered 2022-04-13 – 2022-04-15 (×7): 500 mg via ORAL
  Filled 2022-04-13 (×7): qty 1

## 2022-04-13 MED ORDER — PROPOFOL 500 MG/50ML IV EMUL
INTRAVENOUS | Status: AC
  Filled 2022-04-13: qty 50

## 2022-04-13 MED ORDER — HYDROMORPHONE HCL 1 MG/ML IJ SOLN: 0.5000 mg | INTRAMUSCULAR | Status: DC | PRN

## 2022-04-13 MED ORDER — HYDROMORPHONE HCL 2 MG PO TABS: 2.0000 mg | ORAL_TABLET | ORAL | 0 refills | Status: AC | PRN

## 2022-04-13 MED ORDER — AMISULPRIDE (ANTIEMETIC) 5 MG/2ML IV SOLN: 10.0000 mg | Freq: Once | INTRAVENOUS | Status: DC | PRN

## 2022-04-13 MED ORDER — PROPOFOL 500 MG/50ML IV EMUL
INTRAVENOUS | Status: DC | PRN
  Administered 2022-04-13: 75 ug/kg/min via INTRAVENOUS

## 2022-04-13 MED ORDER — BUPIVACAINE LIPOSOME 1.3 % IJ SUSP
INTRAMUSCULAR | Status: AC
  Filled 2022-04-13: qty 20

## 2022-04-13 MED ORDER — INSULIN ASPART 100 UNIT/ML IJ SOLN
0.0000 [IU] | Freq: Three times a day (TID) | INTRAMUSCULAR | Status: DC
  Administered 2022-04-14: 8 [IU] via SUBCUTANEOUS
  Administered 2022-04-14: 5 [IU] via SUBCUTANEOUS
  Administered 2022-04-14: 8 [IU] via SUBCUTANEOUS
  Administered 2022-04-15 (×2): 3 [IU] via SUBCUTANEOUS
  Administered 2022-04-15 – 2022-04-16 (×2): 2 [IU] via SUBCUTANEOUS

## 2022-04-13 MED ORDER — DOCUSATE SODIUM 100 MG PO CAPS
100.0000 mg | ORAL_CAPSULE | Freq: Two times a day (BID) | ORAL | 1 refills | Status: AC | PRN
Start: 2022-04-13 — End: ?

## 2022-04-13 MED ORDER — PROPOFOL 1000 MG/100ML IV EMUL
INTRAVENOUS | Status: AC
  Filled 2022-04-13: qty 200

## 2022-04-13 MED ORDER — PANTOPRAZOLE SODIUM 40 MG PO TBEC
40.0000 mg | DELAYED_RELEASE_TABLET | Freq: Every day | ORAL | Status: DC
  Administered 2022-04-14 – 2022-04-16 (×3): 40 mg via ORAL
  Filled 2022-04-13 (×3): qty 1

## 2022-04-13 MED ORDER — LISINOPRIL 5 MG PO TABS: 5.0000 mg | ORAL_TABLET | Freq: Every day | ORAL | Status: DC

## 2022-04-13 MED ORDER — INSULIN ASPART PROT & ASPART (70-30 MIX) 100 UNIT/ML ~~LOC~~ SUSP
70.0000 [IU] | Freq: Two times a day (BID) | SUBCUTANEOUS | Status: DC
  Administered 2022-04-14 – 2022-04-16 (×5): 70 [IU] via SUBCUTANEOUS
  Filled 2022-04-13: qty 10

## 2022-04-13 MED ORDER — DIPHENHYDRAMINE HCL 12.5 MG/5ML PO ELIX
12.5000 mg | ORAL_SOLUTION | ORAL | Status: DC | PRN
  Administered 2022-04-15: 25 mg via ORAL
  Filled 2022-04-13: qty 10

## 2022-04-13 MED ORDER — ACETAMINOPHEN 325 MG PO TABS
325.0000 mg | ORAL_TABLET | Freq: Four times a day (QID) | ORAL | Status: DC | PRN
  Administered 2022-04-15 (×2): 650 mg via ORAL
  Filled 2022-04-13 (×2): qty 2

## 2022-04-13 MED ORDER — METOCLOPRAMIDE HCL 5 MG PO TABS: 5.0000 mg | ORAL_TABLET | Freq: Three times a day (TID) | ORAL | Status: DC | PRN

## 2022-04-13 MED ORDER — PHENOL 1.4 % MT LIQD: 1.0000 | OROMUCOSAL | Status: DC | PRN

## 2022-04-13 MED ORDER — FENTANYL CITRATE PF 50 MCG/ML IJ SOSY
50.0000 ug | PREFILLED_SYRINGE | Freq: Once | INTRAMUSCULAR | Status: DC
  Filled 2022-04-13: qty 2

## 2022-04-13 MED ORDER — 0.9 % SODIUM CHLORIDE (POUR BTL) OPTIME
TOPICAL | Status: DC | PRN
  Administered 2022-04-13: 1000 mL

## 2022-04-13 MED ORDER — DEXAMETHASONE SODIUM PHOSPHATE 10 MG/ML IJ SOLN
INTRAMUSCULAR | Status: DC | PRN
Start: 2022-04-13 — End: 2022-04-13
  Administered 2022-04-13: 4 mg via INTRAVENOUS

## 2022-04-13 MED ORDER — METOCLOPRAMIDE HCL 5 MG/ML IJ SOLN: 5.0000 mg | Freq: Three times a day (TID) | INTRAMUSCULAR | Status: DC | PRN

## 2022-04-13 MED ORDER — PROPOFOL 10 MG/ML IV BOLUS
INTRAVENOUS | Status: AC
  Filled 2022-04-13: qty 20

## 2022-04-13 MED ORDER — RISAQUAD PO CAPS
1.0000 | ORAL_CAPSULE | Freq: Every day | ORAL | Status: DC
  Administered 2022-04-13 – 2022-04-16 (×4): 1 via ORAL
  Filled 2022-04-13 (×4): qty 1

## 2022-04-13 MED ORDER — ALLOPURINOL 300 MG PO TABS
300.0000 mg | ORAL_TABLET | Freq: Every day | ORAL | Status: DC
  Administered 2022-04-13 – 2022-04-16 (×4): 300 mg via ORAL
  Filled 2022-04-13 (×4): qty 1

## 2022-04-13 MED ORDER — CEFAZOLIN IN SODIUM CHLORIDE 3-0.9 GM/100ML-% IV SOLN
3.0000 g | INTRAVENOUS | Status: AC
  Administered 2022-04-13: 3 g via INTRAVENOUS
  Filled 2022-04-13: qty 100

## 2022-04-13 MED ORDER — ONDANSETRON HCL 4 MG/2ML IJ SOLN
4.0000 mg | Freq: Four times a day (QID) | INTRAMUSCULAR | Status: DC | PRN
  Administered 2022-04-15: 4 mg via INTRAVENOUS
  Filled 2022-04-13: qty 2

## 2022-04-13 MED ORDER — DEXAMETHASONE SODIUM PHOSPHATE 10 MG/ML IJ SOLN
INTRAMUSCULAR | Status: AC
  Filled 2022-04-13: qty 1

## 2022-04-13 MED ORDER — PROPOFOL 1000 MG/100ML IV EMUL
INTRAVENOUS | Status: AC
  Filled 2022-04-13: qty 100

## 2022-04-13 MED ORDER — LIDOCAINE 2% (20 MG/ML) 5 ML SYRINGE
INTRAMUSCULAR | Status: DC | PRN
  Administered 2022-04-13: 40 mg via INTRAVENOUS

## 2022-04-13 MED ORDER — ONDANSETRON HCL 4 MG/2ML IJ SOLN
INTRAMUSCULAR | Status: DC | PRN
  Administered 2022-04-13: 4 mg via INTRAVENOUS

## 2022-04-13 MED ORDER — ASPIRIN EC 81 MG PO TBEC: 81.0000 mg | DELAYED_RELEASE_TABLET | Freq: Two times a day (BID) | ORAL | 1 refills | Status: AC

## 2022-04-13 MED ORDER — MIDAZOLAM HCL 2 MG/2ML IJ SOLN
INTRAMUSCULAR | Status: DC | PRN
Start: 2022-04-13 — End: 2022-04-13
  Administered 2022-04-13 (×2): 1 mg via INTRAVENOUS

## 2022-04-13 MED ORDER — MIDAZOLAM HCL 2 MG/2ML IJ SOLN
INTRAMUSCULAR | Status: AC
  Filled 2022-04-13: qty 2

## 2022-04-13 MED ORDER — LIDOCAINE HCL 1 % IJ SOLN
INTRAMUSCULAR | Status: DC | PRN
  Administered 2022-04-13: 4 mL

## 2022-04-13 MED ORDER — ZOLPIDEM TARTRATE 10 MG PO TABS
10.0000 mg | ORAL_TABLET | Freq: Every day | ORAL | Status: DC
Start: 2022-04-13 — End: 2022-04-16
  Administered 2022-04-13 – 2022-04-14 (×2): 10 mg via ORAL
  Filled 2022-04-13 (×2): qty 1

## 2022-04-13 MED ORDER — NITROGLYCERIN 0.4 MG SL SUBL: 0.4000 mg | SUBLINGUAL_TABLET | SUBLINGUAL | Status: DC | PRN

## 2022-04-13 MED ORDER — OXYCODONE HCL 5 MG PO TABS: 5.0000 mg | ORAL_TABLET | Freq: Once | ORAL | Status: DC | PRN

## 2022-04-13 MED ORDER — ACETAMINOPHEN 10 MG/ML IV SOLN: 1000.0000 mg | Freq: Once | INTRAVENOUS | Status: DC | PRN

## 2022-04-13 MED ORDER — BUPIVACAINE-EPINEPHRINE 0.25% -1:200000 IJ SOLN
INTRAMUSCULAR | Status: DC | PRN
Start: 2022-04-13 — End: 2022-04-13
  Administered 2022-04-13: 30 mg

## 2022-04-13 MED ORDER — OXYCODONE HCL 5 MG PO TABS
10.0000 mg | ORAL_TABLET | ORAL | Status: DC | PRN
  Administered 2022-04-13 – 2022-04-14 (×3): 15 mg via ORAL
  Administered 2022-04-15: 10 mg via ORAL
  Administered 2022-04-15 – 2022-04-16 (×4): 15 mg via ORAL
  Filled 2022-04-13 (×4): qty 3
  Filled 2022-04-13: qty 2
  Filled 2022-04-13 (×3): qty 3

## 2022-04-13 MED ORDER — ONDANSETRON HCL 4 MG/2ML IJ SOLN
INTRAMUSCULAR | Status: AC
  Filled 2022-04-13: qty 2

## 2022-04-13 MED ORDER — MENTHOL 3 MG MT LOZG: 1.0000 | LOZENGE | OROMUCOSAL | Status: DC | PRN

## 2022-04-13 MED ORDER — ACETAMINOPHEN 500 MG PO TABS
1000.0000 mg | ORAL_TABLET | Freq: Four times a day (QID) | ORAL | Status: AC
  Administered 2022-04-13 – 2022-04-14 (×4): 1000 mg via ORAL
  Filled 2022-04-13 (×4): qty 2

## 2022-04-13 MED ORDER — MIDAZOLAM HCL 2 MG/2ML IJ SOLN
1.0000 mg | Freq: Once | INTRAMUSCULAR | Status: AC
  Administered 2022-04-13: 2 mg via INTRAVENOUS
  Filled 2022-04-13: qty 2

## 2022-04-13 MED ORDER — CEFAZOLIN SODIUM-DEXTROSE 2-4 GM/100ML-% IV SOLN
2.0000 g | Freq: Four times a day (QID) | INTRAVENOUS | Status: AC
  Administered 2022-04-13 – 2022-04-14 (×2): 2 g via INTRAVENOUS
  Filled 2022-04-13 (×2): qty 100

## 2022-04-13 MED ORDER — GABAPENTIN 300 MG PO CAPS
600.0000 mg | ORAL_CAPSULE | Freq: Two times a day (BID) | ORAL | Status: DC
Start: 2022-04-13 — End: 2022-04-16
  Administered 2022-04-13 – 2022-04-16 (×6): 600 mg via ORAL
  Filled 2022-04-13 (×6): qty 2

## 2022-04-13 MED ORDER — BUPIVACAINE-EPINEPHRINE (PF) 0.5% -1:200000 IJ SOLN
INTRAMUSCULAR | Status: DC | PRN
  Administered 2022-04-13: 30 mL via PERINEURAL

## 2022-04-13 MED ORDER — CHLORHEXIDINE GLUCONATE 0.12 % MT SOLN
15.0000 mL | Freq: Once | OROMUCOSAL | Status: AC
  Administered 2022-04-13: 15 mL via OROMUCOSAL

## 2022-04-13 MED ORDER — ALPRAZOLAM 1 MG PO TABS
2.0000 mg | ORAL_TABLET | Freq: Two times a day (BID) | ORAL | Status: DC
  Administered 2022-04-13 – 2022-04-16 (×6): 2 mg via ORAL
  Filled 2022-04-13 (×6): qty 2

## 2022-04-13 MED ORDER — HYDROMORPHONE HCL 2 MG PO TABS
1.0000 mg | ORAL_TABLET | ORAL | Status: DC | PRN
  Administered 2022-04-13 – 2022-04-16 (×6): 2 mg via ORAL
  Filled 2022-04-13 (×6): qty 1

## 2022-04-13 MED ORDER — STERILE WATER FOR IRRIGATION IR SOLN
Status: DC | PRN
  Administered 2022-04-13: 2000 mL

## 2022-04-13 MED ORDER — CITALOPRAM HYDROBROMIDE 20 MG PO TABS
40.0000 mg | ORAL_TABLET | Freq: Every day | ORAL | Status: DC
  Administered 2022-04-13 – 2022-04-16 (×4): 40 mg via ORAL
  Filled 2022-04-13 (×4): qty 2

## 2022-04-13 MED ORDER — TRANEXAMIC ACID-NACL 1000-0.7 MG/100ML-% IV SOLN
1000.0000 mg | INTRAVENOUS | Status: AC
  Administered 2022-04-13: 1000 mg via INTRAVENOUS
  Filled 2022-04-13: qty 100

## 2022-04-13 MED ORDER — ASPIRIN 81 MG PO CHEW
81.0000 mg | CHEWABLE_TABLET | Freq: Two times a day (BID) | ORAL | Status: DC
  Administered 2022-04-13 – 2022-04-16 (×6): 81 mg via ORAL
  Filled 2022-04-13 (×6): qty 1

## 2022-04-13 MED ORDER — OXYCODONE HCL 5 MG/5ML PO SOLN: 5.0000 mg | Freq: Once | ORAL | Status: DC | PRN

## 2022-04-13 MED ORDER — FENTANYL CITRATE PF 50 MCG/ML IJ SOSY: 25.0000 ug | PREFILLED_SYRINGE | INTRAMUSCULAR | Status: DC | PRN

## 2022-04-13 MED ORDER — ACETAMINOPHEN 10 MG/ML IV SOLN
1000.0000 mg | INTRAVENOUS | Status: AC
  Administered 2022-04-13: 1000 mg via INTRAVENOUS
  Filled 2022-04-13: qty 100

## 2022-04-13 MED ORDER — BISACODYL 5 MG PO TBEC: 5.0000 mg | DELAYED_RELEASE_TABLET | Freq: Every day | ORAL | Status: DC | PRN

## 2022-04-13 MED ORDER — ORAL CARE MOUTH RINSE: 15.0000 mL | Freq: Once | OROMUCOSAL | Status: AC

## 2022-04-13 MED ORDER — SODIUM CHLORIDE (PF) 0.9 % IJ SOLN
INTRAMUSCULAR | Status: AC
  Filled 2022-04-13: qty 50

## 2022-04-13 MED ORDER — PHENYLEPHRINE HCL-NACL 20-0.9 MG/250ML-% IV SOLN
INTRAVENOUS | Status: DC | PRN
  Administered 2022-04-13: 20 ug/min via INTRAVENOUS

## 2022-04-13 MED ORDER — LACTATED RINGERS IV SOLN: INTRAVENOUS | Status: DC

## 2022-04-13 MED ORDER — LIDOCAINE HCL (PF) 2 % IJ SOLN
INTRAMUSCULAR | Status: AC
  Filled 2022-04-13: qty 5

## 2022-04-13 MED ORDER — METHOCARBAMOL 1000 MG/10ML IJ SOLN
500.0000 mg | Freq: Four times a day (QID) | INTRAVENOUS | Status: DC | PRN
  Filled 2022-04-13: qty 5

## 2022-04-13 MED ORDER — SODIUM CHLORIDE 0.9 % IR SOLN
Status: DC | PRN
Start: 2022-04-13 — End: 2022-04-13
  Administered 2022-04-13: 1000 mL

## 2022-04-13 MED ORDER — POLYETHYLENE GLYCOL 3350 17 G PO PACK: 17.0000 g | PACK | Freq: Every day | ORAL | 0 refills | Status: AC

## 2022-04-13 MED ORDER — POLYETHYLENE GLYCOL 3350 17 G PO PACK: 17.0000 g | PACK | Freq: Every day | ORAL | Status: DC | PRN

## 2022-04-13 MED ORDER — ALUM & MAG HYDROXIDE-SIMETH 200-200-20 MG/5ML PO SUSP: 30.0000 mL | ORAL | Status: DC | PRN

## 2022-04-13 MED ORDER — DOCUSATE SODIUM 100 MG PO CAPS
100.0000 mg | ORAL_CAPSULE | Freq: Two times a day (BID) | ORAL | Status: DC
  Administered 2022-04-13 – 2022-04-16 (×6): 100 mg via ORAL
  Filled 2022-04-13 (×6): qty 1

## 2022-04-13 MED ORDER — TRIAMCINOLONE ACETONIDE 40 MG/ML IJ SUSP
INTRAMUSCULAR | Status: AC
  Filled 2022-04-13: qty 1

## 2022-04-13 MED ORDER — TRIAMCINOLONE ACETONIDE 40 MG/ML IJ SUSP (RADIOLOGY)
INTRAMUSCULAR | Status: DC | PRN
  Administered 2022-04-13: 40 mg

## 2022-04-13 MED ORDER — ONDANSETRON HCL 4 MG PO TABS: 4.0000 mg | ORAL_TABLET | Freq: Four times a day (QID) | ORAL | Status: DC | PRN

## 2022-04-13 SURGICAL SUPPLY — 83 items
AGENT HMST SPONGE THK3/8 (HEMOSTASIS)
ATTUNE MED DOME PAT 38 KNEE (Knees) ×1 IMPLANT
ATTUNE PS FEM RT SZ 7 CEM KNEE (Femur) ×1 IMPLANT
ATTUNE PSRP INSR SZ7 7 KNEE (Insert) ×1 IMPLANT
BAG COUNTER SPONGE SURGICOUNT (BAG) ×1 IMPLANT
BAG DECANTER FOR FLEXI CONT (MISCELLANEOUS) ×3 IMPLANT
BAG SPEC THK2 15X12 ZIP CLS (MISCELLANEOUS) ×2
BAG SPNG CNTER NS LX DISP (BAG) ×2
BAG ZIPLOCK 12X15 (MISCELLANEOUS) ×1 IMPLANT
BASE TIBIAL ROT PLAT SZ 7 KNEE (Knees) IMPLANT
BLADE SAW SGTL 11.0X1.19X90.0M (BLADE) ×3 IMPLANT
BLADE SAW SGTL 13.0X1.19X90.0M (BLADE) ×3 IMPLANT
BLADE SURG SZ10 CARB STEEL (BLADE) ×6 IMPLANT
BNDG CMPR MED 10X6 ELC LF (GAUZE/BANDAGES/DRESSINGS) ×2
BNDG COHESIVE 4X5 TAN ST LF (GAUZE/BANDAGES/DRESSINGS) ×3 IMPLANT
BNDG ELASTIC 4X5.8 VLCR STR LF (GAUZE/BANDAGES/DRESSINGS) ×3 IMPLANT
BNDG ELASTIC 6X10 VLCR STRL LF (GAUZE/BANDAGES/DRESSINGS) ×1 IMPLANT
BNDG ELASTIC 6X5.8 VLCR STR LF (GAUZE/BANDAGES/DRESSINGS) ×3 IMPLANT
BOWL SMART MIX CTS (DISPOSABLE) ×3 IMPLANT
BSPLAT TIB 7 CMNT ROT PLAT STR (Knees) ×2 IMPLANT
CEMENT HV SMART SET (Cement) ×6 IMPLANT
COVER SURGICAL LIGHT HANDLE (MISCELLANEOUS) ×3 IMPLANT
CUFF TOURN SGL QUICK 34 (TOURNIQUET CUFF) ×3
CUFF TRNQT CYL 34X4.125X (TOURNIQUET CUFF) ×2 IMPLANT
DRAPE INCISE IOBAN 66X45 STRL (DRAPES) ×3 IMPLANT
DRAPE ORTHO SPLIT 77X108 STRL (DRAPES) ×6
DRAPE SHEET LG 3/4 BI-LAMINATE (DRAPES) ×6 IMPLANT
DRAPE SURG ORHT 6 SPLT 77X108 (DRAPES) ×4 IMPLANT
DRAPE U-SHAPE 47X51 STRL (DRAPES) ×3 IMPLANT
DRESSING AQUACEL AG SP 3.5X10 (GAUZE/BANDAGES/DRESSINGS) IMPLANT
DRSG AQUACEL AG ADV 3.5X10 (GAUZE/BANDAGES/DRESSINGS) ×3 IMPLANT
DRSG AQUACEL AG SP 3.5X10 (GAUZE/BANDAGES/DRESSINGS) ×3
DRSG TEGADERM 4X4.75 (GAUZE/BANDAGES/DRESSINGS) IMPLANT
DURAPREP 26ML APPLICATOR (WOUND CARE) ×3 IMPLANT
ELECT BLADE TIP CTD 4 INCH (ELECTRODE) ×2 IMPLANT
ELECT REM PT RETURN 15FT ADLT (MISCELLANEOUS) ×3 IMPLANT
EVACUATOR 1/8 PVC DRAIN (DRAIN) IMPLANT
GAUZE SPONGE 2X2 8PLY STRL LF (GAUZE/BANDAGES/DRESSINGS) IMPLANT
GLOVE BIO SURGEON STRL SZ7 (GLOVE) ×5 IMPLANT
GLOVE BIOGEL PI IND STRL 7.0 (GLOVE) ×2 IMPLANT
GLOVE BIOGEL PI IND STRL 8 (GLOVE) ×2 IMPLANT
GLOVE BIOGEL PI INDICATOR 7.0 (GLOVE) ×3
GLOVE BIOGEL PI INDICATOR 8 (GLOVE) ×2
GLOVE SURG SS PI 8.0 STRL IVOR (GLOVE) ×7 IMPLANT
GOWN STRL REUS W/ TWL XL LVL3 (GOWN DISPOSABLE) ×4 IMPLANT
GOWN STRL REUS W/TWL XL LVL3 (GOWN DISPOSABLE) ×9
HANDPIECE INTERPULSE COAX TIP (DISPOSABLE) ×3
HEMOSTAT SPONGE AVITENE ULTRA (HEMOSTASIS) IMPLANT
HOLDER FOLEY CATH W/STRAP (MISCELLANEOUS) ×1 IMPLANT
IMMOBILIZER KNEE 20 (SOFTGOODS) ×6
IMMOBILIZER KNEE 20 THIGH 36 (SOFTGOODS) ×2 IMPLANT
KIT BASIN OR (CUSTOM PROCEDURE TRAY) ×1 IMPLANT
KIT TURNOVER KIT A (KITS) ×1 IMPLANT
MANIFOLD NEPTUNE II (INSTRUMENTS) ×3 IMPLANT
NDL SAFETY ECLIPSE 18X1.5 (NEEDLE) IMPLANT
NEEDLE HYPO 18GX1.5 SHARP (NEEDLE)
NS IRRIG 1000ML POUR BTL (IV SOLUTION) IMPLANT
PACK TOTAL KNEE CUSTOM (KITS) ×3 IMPLANT
PROTECTOR NERVE ULNAR (MISCELLANEOUS) ×3 IMPLANT
SAW OSC TIP CART 19.5X105X1.3 (SAW) ×3 IMPLANT
SEALER BIPOLAR AQUA 6.0 (INSTRUMENTS) IMPLANT
SET HNDPC FAN SPRY TIP SCT (DISPOSABLE) ×2 IMPLANT
SOLUTION PRONTOSAN WOUND 350ML (IRRIGATION / IRRIGATOR) ×3 IMPLANT
SPIKE FLUID TRANSFER (MISCELLANEOUS) ×3 IMPLANT
SPONGE GAUZE 2X2 STER 10/PKG (GAUZE/BANDAGES/DRESSINGS)
STAPLER VISISTAT (STAPLE) ×1 IMPLANT
STRIP CLOSURE SKIN 1/2X4 (GAUZE/BANDAGES/DRESSINGS) ×2 IMPLANT
SUT BONE WAX W31G (SUTURE) ×3 IMPLANT
SUT MNCRL AB 4-0 PS2 18 (SUTURE) ×1 IMPLANT
SUT STRATAFIX 0 PDS 27 VIOLET (SUTURE) ×3
SUT VIC AB 1 CT1 27 (SUTURE) ×9
SUT VIC AB 1 CT1 27XBRD ANTBC (SUTURE) ×4 IMPLANT
SUT VIC AB 1 CTX 36 (SUTURE) ×3
SUT VIC AB 1 CTX36XBRD ANBCTR (SUTURE) IMPLANT
SUT VIC AB 2-0 CT1 27 (SUTURE) ×9
SUT VIC AB 2-0 CT1 TAPERPNT 27 (SUTURE) ×6 IMPLANT
SUTURE STRATFX 0 PDS 27 VIOLET (SUTURE) ×2 IMPLANT
SYR 3ML LL SCALE MARK (SYRINGE) IMPLANT
TIBIAL BASE ROT PLAT SZ 7 KNEE (Knees) ×3 IMPLANT
TRAY FOLEY MTR SLVR 16FR STAT (SET/KITS/TRAYS/PACK) ×3 IMPLANT
WATER STERILE IRR 1000ML POUR (IV SOLUTION) ×3 IMPLANT
WIPE CHG 2% PREP (PERSONAL CARE ITEMS) ×3 IMPLANT
WRAP KNEE MAXI GEL POST OP (GAUZE/BANDAGES/DRESSINGS) ×3 IMPLANT

## 2022-04-13 NOTE — Progress Notes (Signed)
AssistedDr. Ellender with right, adductor canal, ultrasound guided block. Side rails up, monitors on throughout procedure. See vital signs in flow sheet. Tolerated Procedure well.  

## 2022-04-13 NOTE — Anesthesia Procedure Notes (Signed)
Spinal ? ?Patient location during procedure: OR ?Start time: 04/13/2022 1:40 PM ?End time: 04/13/2022 1:45 PM ?Reason for block: surgical anesthesia ?Staffing ?Performed: anesthesiologist  ?Anesthesiologist: Merlinda Frederick, MD ?Preanesthetic Checklist ?Completed: patient identified, IV checked, risks and benefits discussed, surgical consent, monitors and equipment checked, pre-op evaluation and timeout performed ?Spinal Block ?Patient position: sitting ?Prep: DuraPrep ?Patient monitoring: cardiac monitor, continuous pulse ox and blood pressure ?Approach: midline ?Location: L3-4 ?Injection technique: single-shot ?Needle ?Needle type: Pencan  ?Needle gauge: 24 G ?Needle length: 9 cm ?Assessment ?Events: CSF return ?Additional Notes ?Functioning IV was confirmed and monitors were applied. Sterile prep and drape, including hand hygiene and sterile gloves were used. The patient was positioned and the spine was prepped. The skin was anesthetized with lidocaine.  Free flow of clear CSF was obtained prior to injecting local anesthetic into the CSF.  The spinal needle aspirated freely following injection.  The needle was carefully withdrawn.  The patient tolerated the procedure well.  ? ? ? ?

## 2022-04-13 NOTE — Transfer of Care (Signed)
Immediate Anesthesia Transfer of Care Note ? ?Patient: Elijah Watkins ? ?Procedure(s) Performed: TOTAL KNEE ARTHROPLASTY (Right: Knee) ?KNEE INJECTION (Left: Knee) ? ?Patient Location: PACU ? ?Anesthesia Type:Spinal ? ?Level of Consciousness: drowsy and responds to stimulation ? ?Airway & Oxygen Therapy: pt spontaneously breathing with facemask O2 and oral airway in place ? ?Post-op Assessment: Report given to RN and Post -op Vital signs reviewed and stable ? ?Post vital signs: Reviewed and stable ? ?Last Vitals:  ?Vitals Value Taken Time  ?BP 111/65 04/13/22 1630  ?Temp    ?Pulse 64 04/13/22 1631  ?Resp 11 04/13/22 1631  ?SpO2 92 % 04/13/22 1631  ?Vitals shown include unvalidated device data. ? ?Last Pain:  ?Vitals:  ? 04/13/22 1231  ?TempSrc:   ?PainSc: 0-No pain  ?   ? ?Patients Stated Pain Goal: 2 (04/13/22 1156) ? ?Complications: No notable events documented. ?

## 2022-04-13 NOTE — Anesthesia Postprocedure Evaluation (Signed)
Anesthesia Post Note ? ?Patient: Elijah Watkins ? ?Procedure(s) Performed: TOTAL KNEE ARTHROPLASTY (Right: Knee) ?KNEE INJECTION (Left: Knee) ? ?  ? ?Patient location during evaluation: PACU ?Anesthesia Type: Regional and Spinal ?Level of consciousness: awake and alert ?Pain management: pain level controlled ?Vital Signs Assessment: post-procedure vital signs reviewed and stable ?Respiratory status: spontaneous breathing and respiratory function stable ?Cardiovascular status: blood pressure returned to baseline and stable ?Postop Assessment: spinal receding ?Anesthetic complications: no ? ? ?No notable events documented. ? ?Last Vitals:  ?Vitals:  ? 04/13/22 1745 04/13/22 1814  ?BP: 134/84 139/81  ?Pulse: 62 63  ?Resp: 13   ?Temp:  36.6 ?C  ?SpO2: 96% 91%  ?  ?Last Pain:  ?Vitals:  ? 04/13/22 1814  ?TempSrc: Oral  ?PainSc:   ? ? ?  ?  ?  ?  ?  ?  ? ?Anjalee Cope DANIEL ? ? ? ? ?

## 2022-04-13 NOTE — H&P (Signed)
Elijah Watkins is an 56 y.o. male.   ?Chief Complaint: right knee pain ?HPI: Elijah Watkins is here today for his history and physical. He is scheduled for a right total knee arthroplasty on 04/15/2022 at [facility] by Dr. Susa Day. ? ?Ongoing right knee pain refractory to injection therapy, activity modifications, medications, weight loss, quad strengthening, bracing. Pain and instability are interfering with quality of life and activities of daily living at this point and he desires to proceed with surgery. ? ?Dr. Tonita Cong and the patient mutually agreed to proceed with a total knee replacement. Risks and benefits of the procedure were discussed including stiffness, suboptimal range of motion, persistent pain, infection requiring removal of prosthesis and reinsertion, need for prophylactic antibiotics in the future, for example, dental procedures, possible need for manipulation, revision in the future and also anesthetic complications including DVT, PE, etc. We discussed the perioperative course, time in the hospital, postoperative recovery and the need for elevation to control swelling. We also discussed the predicted range of motion and the probability that squatting and kneeling would be unobtainable in the future. In addition, postoperative anticoagulation was discussed. We have obtained preoperative medical clearance as necessary. Provided illustrated handout and discussed it in detail. They will enroll in the total joint replacement educational forum at the hospital. ? ?He has been cleared by PCP and cardiology. ? ?Past Medical History:  ?Diagnosis Date  ? Anger   ? anger issues  ? Arthritis   ? CAD (coronary artery disease)   ? Diabetes mellitus   ? IDDM - fasting 70-120s  ? GERD (gastroesophageal reflux disease)   ? History of concussion   ? History of DVT (deep vein thrombosis) 03/2005  ? History of kidney stones   ? History of peptic ulcer age 56  ? Hyperlipidemia   ? Hypertension   ? under  control, has been on med. > 7 yrs.  ? Myocardial infarction [facility])   ? stents age 25  ? Nephrolithiasis   ? III  ? Overweight(278.02)   ? Pilonidal cyst 05/2012  ? area is open, not draining  ? PONV (postoperative nausea and vomiting)   ? S/P coronary artery stent placement 2001, 05/2011  ? Seasonal allergies   ? ? ?Past Surgical History:  ?Procedure Laterality Date  ? CARDIAC CATHETERIZATION  10/06/2010  ? CARPAL TUNNEL RELEASE Right 2013  ? CHOLECYSTECTOMY N/A 08/18/2013  ? Procedure: LAPAROSCOPIC CHOLECYSTECTOMY WITH INTRAOPERATIVE CHOLANGIOGRAM;  Surgeon: Merrie Roof, MD;  Location: [place];  Service: General;  Laterality: N/A;  ? CHONDROPLASTY  08/04/2004  ? right knee  ? CORONARY ANGIOPLASTY WITH STENT PLACEMENT    ? x 2; last time 05/2011  ? CORONARY STENT INTERVENTION N/A 08/22/2018  ? Procedure: CORONARY STENT INTERVENTION;  Surgeon: Jettie Booze, MD;  Location: [place] [facility];  Service: Cardiovascular;  Laterality: N/A;  ? DISTAL BICEPS TENDON REPAIR Left 02/19/2016  ? Procedure: DISTAL BICEPS TENDON REPAIR;  Surgeon: Dorna Leitz, MD;  Location: [place];  Service: Orthopedics;  Laterality: Left;  ? [place]  ? x 3  ? knee arthroscopic    ? left knee  ? KNEE CARTILAGE SURGERY  10/2003, 11/2003  ? Also had bursa removed  ? LEFT HEART CATH AND CORONARY ANGIOGRAPHY N/A 08/22/2018  ? Procedure: LEFT HEART CATH AND CORONARY ANGIOGRAPHY;  Surgeon: Jettie Booze, MD;  Location: [place] [facility];  Service: Cardiovascular;  Laterality: N/A;  ?  PILONIDAL CYST DRAINAGE  05/19/2012  ? Procedure: IRRIGATION AND DEBRIDEMENT PILONIDAL CYST;  Surgeon: Merrie Roof, MD;  Location: [place];  Service: General;  Laterality: N/A;  I & D Pilonidal abscess, placement of acell  ? PREPATELLAR BURSA EXCISION  12/05/2004  ? right  ? WRIST SURGERY  02/11/1997  ? right  ? ? ?Family History  ?Problem Relation Age of Onset  ? Diabetes Mother 69  ?  Hypertension Father   ? Alcohol abuse Father 38  ? ?Social History:  reports that he has never smoked. He has never used smokeless tobacco. He reports current alcohol use. He reports that he does not use drugs. ? ?Allergies:  ?Allergies  ?Allergen Reactions  ? Latex Hives, Swelling and Other (See Comments)  ?  Dry cough  ? ?Current meds: ?allopurinoL 300 mg tablet ?ALPRAZolam 1 mg tablet ?Antiseptic Skin Cleanser (chlorhexidine) 4 % liquid ?buPROPion HCL XL 300 mg 24 hr tablet, extended release ?citalopram 40 mg tablet ?clopidogreL 75 mg tablet ?gabapentin 600 mg tablet ?lisinopriL 10 mg tablet ?metFORMIN 500 mg tablet ?metoprolol tartrate 50 mg tablet ?nitroglycerin 0.4 mg sublingual tablet ?NovoLIN 70/30 U-100 Insulin 100 unit/mL subcutaneous suspension ?omeprazole ?zolpidem ? ?Review of Systems  ?Constitutional: Negative.   ?HENT: Negative.    ?Eyes: Negative.   ?Respiratory: Negative.    ?Cardiovascular: Negative.   ?Gastrointestinal: Negative.   ?Endocrine: Negative.   ?Genitourinary: Negative.   ?Musculoskeletal:  Positive for arthralgias, gait problem, joint swelling and myalgias.  ?Skin: Negative.   ?Allergic/Immunologic: Negative.   ?Psychiatric/Behavioral: Negative.    ? ?There were no vitals taken for this visit. ?Physical Exam ?Constitutional:   ?   Appearance: Normal appearance.  ?HENT:  ?   Head: Normocephalic and atraumatic.  ?   Right Ear: External ear normal.  ?   Left Ear: External ear normal.  ?   Nose: Nose normal.  ?   Mouth/Throat:  ?   Pharynx: Oropharynx is clear.  ?Eyes:  ?   Conjunctiva/sclera: Conjunctivae normal.  ?Cardiovascular:  ?   Rate and Rhythm: Normal rate and regular rhythm.  ?   Pulses: Normal pulses.  ?   Heart sounds: Normal heart sounds.  ?Pulmonary:  ?   Effort: Pulmonary effort is normal.  ?   Breath sounds: Normal breath sounds.  ?Musculoskeletal:  ?   Cervical back: Normal range of motion.  ?   Comments: Patient is an moderate stress walks an antalgic gait right knee.  Tender medial joint line patellofemoral pain compression. No effusion is ranges 0-1 40. He has a dynamic pes planus. Good quad strength.  ?Skin: ?   General: Skin is warm and dry.  ?Neurological:  ?   Mental Status: He is alert.  ?  ?X-rays of the knees AP lateral and sunrise demonstrate end-stage osteoarthrosis medial compartment of the right knee. Moderately severe medial compartment left knee. ? ?Assessment/Plan ?Impression: End-stage right knee osteoarthritis ? ?Plan: Pt with end-stage right knee DJD, bone-on-bone, refractory to conservative tx, scheduled for right total knee replacement by Dr. Tonita Cong on May 1. We again discussed the procedure itself as well as risks, complications and alternatives, including but not limited to DVT, PE, infx, bleeding, failure of procedure, need for secondary procedure including manipulation, nerve injury, ongoing pain/symptoms, anesthesia risk, even stroke or death. Also discussed typical post-op protocols, activity restrictions, need for PT, flexion/extension exercises, time out of work. Discussed need for DVT ppx post-op per protocol. Discussed dental ppx and infx  prevention. Also discussed limitations post-operatively such as kneeling and squatting. All questions were answered. Patient desires to proceed with surgery as scheduled. ? ?Will hold supplements, ASA and NSAIDs accordingly. Will remain NPO after midnight the night before surgery. Will present to [facility] for pre-op testing. Anticipate hospital stay to include at least 2 midnights given medical history and to ensure proper pain control. Plan aspirin and Plavix for DVT ppx post-op. Plan p.o. Dilaudid, Robaxin, Colace, Miralax. Plan home post-op with family members at home for assistance, would like to start outpatient PT immediately postop at his gym, [employer]. Will follow up 10-14 days post-op for staple removal and xrays. He has also asked about proceeding with a left knee intra-articular cortisone injection at the time  of surgery which we will add to his consent form. ? ?Plan right total knee replacement ? ?Cecilie Kicks, PA-C for Dr. Tonita Cong ?04/13/2022, 8:15 AM ? ? ? ?

## 2022-04-13 NOTE — Discharge Instructions (Signed)
Elevate leg above heart 6x a day for 20minutes each ?Use knee immobilizer while walking until can SLR x 10 ?Use knee immobilizer in bed to keep knee in extension ?Aquacel dressing may remain in place until follow up. May shower with aquacel dressing in place. If the dressing becomes saturated or peels off, you may remove aquacel dressing. Do not remove steri-strips if they are present. Place new dressing with gauze and tape or ACE bandage which should be kept clean and dry and changed daily.  ? ?INSTRUCTIONS AFTER JOINT REPLACEMENT  ? ?Remove items at home which could result in a fall. This includes throw rugs or furniture in walking pathways ?ICE to the affected joint every three hours while awake for 30 minutes at a time, for at least the first 3-5 days, and then as needed for pain and swelling.  Continue to use ice for pain and swelling. You may notice swelling that will progress down to the foot and ankle.  This is normal after surgery.  Elevate your leg when you are not up walking on it.   ?Continue to use the breathing machine you got in the hospital (incentive spirometer) which will help keep your temperature down.  It is common for your temperature to cycle up and down following surgery, especially at night when you are not up moving around and exerting yourself.  The breathing machine keeps your lungs expanded and your temperature down. ? ? ?DIET:  As you were doing prior to hospitalization, we recommend a well-balanced diet. ? ?DRESSING / WOUND CARE / SHOWERING ? ?Keep the surgical dressing until follow up.  The dressing is water proof, so you can shower without any extra covering.  IF THE DRESSING FALLS OFF or the wound gets wet inside, change the dressing with sterile gauze.  Please use good hand washing techniques before changing the dressing.  Do not use any lotions or creams on the incision until instructed by your surgeon.   ? ?ACTIVITY ? ?Increase activity slowly as tolerated, but follow the weight  bearing instructions below.   ?No driving for 6 weeks or until further direction given by your physician.  You cannot drive while taking narcotics.  ?No lifting or carrying greater than 10 lbs. until further directed by your surgeon. ?Avoid periods of inactivity such as sitting longer than an hour when not asleep. This helps prevent blood clots.  ?You may return to work once you are authorized by your doctor.  ? ? ? ?WEIGHT BEARING  ? ?Weight bearing as tolerated with assist device (walker, cane, etc) as directed, use it as long as suggested by your surgeon or therapist, typically at least 4-6 weeks. ? ? ?EXERCISES ? ?Results after joint replacement surgery are often greatly improved when you follow the exercise, range of motion and muscle strengthening exercises prescribed by your doctor. Safety measures are also important to protect the joint from further injury. Any time any of these exercises cause you to have increased pain or swelling, decrease what you are doing until you are comfortable again and then slowly increase them. If you have problems or questions, call your caregiver or physical therapist for advice.  ? ?Rehabilitation is important following a joint replacement. After just a few days of immobilization, the muscles of the leg can become weakened and shrink (atrophy).  These exercises are designed to build up the tone and strength of the thigh and leg muscles and to improve motion. Often times heat used for twenty to thirty   minutes before working out will loosen up your tissues and help with improving the range of motion but do not use heat for the first two weeks following surgery (sometimes heat can increase post-operative swelling).  ? ?These exercises can be done on a training (exercise) mat, on the floor, on a table or on a bed. Use whatever works the best and is most comfortable for you.    Use music or television while you are exercising so that the exercises are a pleasant break in your day.  This will make your life better with the exercises acting as a break in your routine that you can look forward to.   Perform all exercises about fifteen times, three times per day or as directed.  You should exercise both the operative leg and the other leg as well. ? ?Exercises include: ?  ?Quad Sets - Tighten up the muscle on the front of the thigh (Quad) and hold for 5-10 seconds.   ?Straight Leg Raises - With your knee straight (if you were given a brace, keep it on), lift the leg to 60 degrees, hold for 3 seconds, and slowly lower the leg.  Perform this exercise against resistance later as your leg gets stronger.  ?Leg Slides: Lying on your back, slowly slide your foot toward your buttocks, bending your knee up off the floor (only go as far as is comfortable). Then slowly slide your foot back down until your leg is flat on the floor again.  ?Angel Wings: Lying on your back spread your legs to the side as far apart as you can without causing discomfort.  ?Hamstring Strength:  Lying on your back, push your heel against the floor with your leg straight by tightening up the muscles of your buttocks.  Repeat, but this time bend your knee to a comfortable angle, and push your heel against the floor.  You may put a pillow under the heel to make it more comfortable if necessary.  ? ?A rehabilitation program following joint replacement surgery can speed recovery and prevent re-injury in the future due to weakened muscles. Contact your doctor or a physical therapist for more information on knee rehabilitation.  ? ? ?CONSTIPATION ? ?Constipation is defined medically as fewer than three stools per week and severe constipation as less than one stool per week.  Even if you have a regular bowel pattern at home, your normal regimen is likely to be disrupted due to multiple reasons following surgery.  Combination of anesthesia, postoperative narcotics, change in appetite and fluid intake all can affect your bowels.  ? ?YOU MUST  use at least one of the following options; they are listed in order of increasing strength to get the job done.  They are all available over the counter, and you may need to use some, POSSIBLY even all of these options:   ? ?Drink plenty of fluids (prune juice may be helpful) and high fiber foods ?Colace 100 mg by mouth twice a day  ?Senokot for constipation as directed and as needed Dulcolax (bisacodyl), take with full glass of water  ?Miralax (polyethylene glycol) once or twice a day as needed. ? ?If you have tried all these things and are unable to have a bowel movement in the first 3-4 days after surgery call either your surgeon or your primary doctor.   ? ?If you experience loose stools or diarrhea, hold the medications until you stool forms back up.  If your symptoms do not get better within   1 week or if they get worse, check with your doctor.  If you experience "the worst abdominal pain ever" or develop nausea or vomiting, please contact the office immediately for further recommendations for treatment. ? ? ?ITCHING:  If you experience itching with your medications, try taking only a single pain pill, or even half a pain pill at a time.  You can also use Benadryl over the counter for itching or also to help with sleep.  ? ?TED HOSE STOCKINGS:  Use stockings on both legs until for at least 2 weeks or as directed by physician office. They may be removed at night for sleeping. ? ?MEDICATIONS:  See your medication summary on the ?After Visit Summary? that nursing will review with you.  You may have some home medications which will be placed on hold until you complete the course of blood thinner medication.  It is important for you to complete the blood thinner medication as prescribed. ? ?PRECAUTIONS:  If you experience chest pain or shortness of breath - call 911 immediately for transfer to the hospital emergency department.  ? ?If you develop a fever greater that 101 F, purulent drainage from wound, increased  redness or drainage from wound, foul odor from the wound/dressing, or calf pain - CONTACT YOUR SURGEON.   ?                                                ?FOLLOW-UP APPOINTMENTS:  If you do not alread

## 2022-04-13 NOTE — H&P (View-Only) (Signed)
Elijah Watkins is an 56 y.o. male.   ?Chief Complaint: right knee pain ?HPI: Elijah Watkins is here today for his history and physical. He is scheduled for a right total knee arthroplasty on 04/15/2022 at [facility] by Dr. Susa Day. ? ?Ongoing right knee pain refractory to injection therapy, activity modifications, medications, weight loss, quad strengthening, bracing. Pain and instability are interfering with quality of life and activities of daily living at this point and he desires to proceed with surgery. ? ?Dr. Tonita Cong and the patient mutually agreed to proceed with a total knee replacement. Risks and benefits of the procedure were discussed including stiffness, suboptimal range of motion, persistent pain, infection requiring removal of prosthesis and reinsertion, need for prophylactic antibiotics in the future, for example, dental procedures, possible need for manipulation, revision in the future and also anesthetic complications including DVT, PE, etc. We discussed the perioperative course, time in the hospital, postoperative recovery and the need for elevation to control swelling. We also discussed the predicted range of motion and the probability that squatting and kneeling would be unobtainable in the future. In addition, postoperative anticoagulation was discussed. We have obtained preoperative medical clearance as necessary. Provided illustrated handout and discussed it in detail. They will enroll in the total joint replacement educational forum at the hospital. ? ?He has been cleared by PCP and cardiology. ? ?Past Medical History:  ?Diagnosis Date  ? Anger   ? anger issues  ? Arthritis   ? CAD (coronary artery disease)   ? Diabetes mellitus   ? IDDM - fasting 70-120s  ? GERD (gastroesophageal reflux disease)   ? History of concussion   ? History of DVT (deep vein thrombosis) 03/2005  ? History of kidney stones   ? History of peptic ulcer age 39  ? Hyperlipidemia   ? Hypertension   ? under  control, has been on med. > 7 yrs.  ? Myocardial infarction [facility])   ? stents age 85  ? Nephrolithiasis   ? III  ? Overweight(278.02)   ? Pilonidal cyst 05/2012  ? area is open, not draining  ? PONV (postoperative nausea and vomiting)   ? S/P coronary artery stent placement 2001, 05/2011  ? Seasonal allergies   ? ? ?Past Surgical History:  ?Procedure Laterality Date  ? CARDIAC CATHETERIZATION  10/06/2010  ? CARPAL TUNNEL RELEASE Right 2013  ? CHOLECYSTECTOMY N/A 08/18/2013  ? Procedure: LAPAROSCOPIC CHOLECYSTECTOMY WITH INTRAOPERATIVE CHOLANGIOGRAM;  Surgeon: Merrie Roof, MD;  Location: [place];  Service: General;  Laterality: N/A;  ? CHONDROPLASTY  08/04/2004  ? right knee  ? CORONARY ANGIOPLASTY WITH STENT PLACEMENT    ? x 2; last time 05/2011  ? CORONARY STENT INTERVENTION N/A 08/22/2018  ? Procedure: CORONARY STENT INTERVENTION;  Surgeon: Jettie Booze, MD;  Location: [place] [facility];  Service: Cardiovascular;  Laterality: N/A;  ? DISTAL BICEPS TENDON REPAIR Left 02/19/2016  ? Procedure: DISTAL BICEPS TENDON REPAIR;  Surgeon: Dorna Leitz, MD;  Location: [place];  Service: Orthopedics;  Laterality: Left;  ? [place]  ? x 3  ? knee arthroscopic    ? left knee  ? KNEE CARTILAGE SURGERY  10/2003, 11/2003  ? Also had bursa removed  ? LEFT HEART CATH AND CORONARY ANGIOGRAPHY N/A 08/22/2018  ? Procedure: LEFT HEART CATH AND CORONARY ANGIOGRAPHY;  Surgeon: Jettie Booze, MD;  Location: [place] [facility];  Service: Cardiovascular;  Laterality: N/A;  ?  PILONIDAL CYST DRAINAGE  05/19/2012  ? Procedure: IRRIGATION AND DEBRIDEMENT PILONIDAL CYST;  Surgeon: Merrie Roof, MD;  Location: [place];  Service: General;  Laterality: N/A;  I & D Pilonidal abscess, placement of acell  ? PREPATELLAR BURSA EXCISION  12/05/2004  ? right  ? WRIST SURGERY  02/11/1997  ? right  ? ? ?Family History  ?Problem Relation Age of Onset  ? Diabetes Mother 42  ?  Hypertension Father   ? Alcohol abuse Father 48  ? ?Social History:  reports that he has never smoked. He has never used smokeless tobacco. He reports current alcohol use. He reports that he does not use drugs. ? ?Allergies:  ?Allergies  ?Allergen Reactions  ? Latex Hives, Swelling and Other (See Comments)  ?  Dry cough  ? ?Current meds: ?allopurinoL 300 mg tablet ?ALPRAZolam 1 mg tablet ?Antiseptic Skin Cleanser (chlorhexidine) 4 % liquid ?buPROPion HCL XL 300 mg 24 hr tablet, extended release ?citalopram 40 mg tablet ?clopidogreL 75 mg tablet ?gabapentin 600 mg tablet ?lisinopriL 10 mg tablet ?metFORMIN 500 mg tablet ?metoprolol tartrate 50 mg tablet ?nitroglycerin 0.4 mg sublingual tablet ?NovoLIN 70/30 U-100 Insulin 100 unit/mL subcutaneous suspension ?omeprazole ?zolpidem ? ?Review of Systems  ?Constitutional: Negative.   ?HENT: Negative.    ?Eyes: Negative.   ?Respiratory: Negative.    ?Cardiovascular: Negative.   ?Gastrointestinal: Negative.   ?Endocrine: Negative.   ?Genitourinary: Negative.   ?Musculoskeletal:  Positive for arthralgias, gait problem, joint swelling and myalgias.  ?Skin: Negative.   ?Allergic/Immunologic: Negative.   ?Psychiatric/Behavioral: Negative.    ? ?There were no vitals taken for this visit. ?Physical Exam ?Constitutional:   ?   Appearance: Normal appearance.  ?HENT:  ?   Head: Normocephalic and atraumatic.  ?   Right Ear: External ear normal.  ?   Left Ear: External ear normal.  ?   Nose: Nose normal.  ?   Mouth/Throat:  ?   Pharynx: Oropharynx is clear.  ?Eyes:  ?   Conjunctiva/sclera: Conjunctivae normal.  ?Cardiovascular:  ?   Rate and Rhythm: Normal rate and regular rhythm.  ?   Pulses: Normal pulses.  ?   Heart sounds: Normal heart sounds.  ?Pulmonary:  ?   Effort: Pulmonary effort is normal.  ?   Breath sounds: Normal breath sounds.  ?Musculoskeletal:  ?   Cervical back: Normal range of motion.  ?   Comments: Patient is an moderate stress walks an antalgic gait right knee.  Tender medial joint line patellofemoral pain compression. No effusion is ranges 0-1 40. He has a dynamic pes planus. Good quad strength.  ?Skin: ?   General: Skin is warm and dry.  ?Neurological:  ?   Mental Status: He is alert.  ?  ?X-rays of the knees AP lateral and sunrise demonstrate end-stage osteoarthrosis medial compartment of the right knee. Moderately severe medial compartment left knee. ? ?Assessment/Plan ?Impression: End-stage right knee osteoarthritis ? ?Plan: Pt with end-stage right knee DJD, bone-on-bone, refractory to conservative tx, scheduled for right total knee replacement by Dr. Tonita Cong on May 1. We again discussed the procedure itself as well as risks, complications and alternatives, including but not limited to DVT, PE, infx, bleeding, failure of procedure, need for secondary procedure including manipulation, nerve injury, ongoing pain/symptoms, anesthesia risk, even stroke or death. Also discussed typical post-op protocols, activity restrictions, need for PT, flexion/extension exercises, time out of work. Discussed need for DVT ppx post-op per protocol. Discussed dental ppx and infx  prevention. Also discussed limitations post-operatively such as kneeling and squatting. All questions were answered. Patient desires to proceed with surgery as scheduled. ? ?Will hold supplements, ASA and NSAIDs accordingly. Will remain NPO after midnight the night before surgery. Will present to [facility] for pre-op testing. Anticipate hospital stay to include at least 2 midnights given medical history and to ensure proper pain control. Plan aspirin and Plavix for DVT ppx post-op. Plan p.o. Dilaudid, Robaxin, Colace, Miralax. Plan home post-op with family members at home for assistance, would like to start outpatient PT immediately postop at his gym, [employer] [facility]. Will follow up 10-14 days post-op for staple removal and xrays. He has also asked about proceeding with a left knee intra-articular cortisone injection at the time  of surgery which we will add to his consent form. ? ?Plan right total knee replacement ? ?Cecilie Kicks, PA-C for Dr. Tonita Cong ?04/13/2022, 8:15 AM ? ? ? ?

## 2022-04-13 NOTE — Interval H&P Note (Signed)
History and Physical Interval Note: ? ?04/13/2022 ?12:59 PM ? ?Elijah Watkins  has presented today for surgery, with the diagnosis of Right knee degenerative joint disease.  The various methods of treatment have been discussed with the patient and family. After consideration of risks, benefits and other options for treatment, the patient has consented to  Procedure(s): ?TOTAL KNEE ARTHROPLASTY (Right) as a surgical intervention.  The patient's history has been reviewed, patient examined, no change in status, stable for surgery.  I have reviewed the patient's chart and labs.  Questions were answered to the patient's satisfaction.   ? ? ?Javier Docker ? ? ?

## 2022-04-13 NOTE — Progress Notes (Signed)
Attempted report four times. ?

## 2022-04-13 NOTE — Anesthesia Procedure Notes (Signed)
Anesthesia Regional Block: Adductor canal block  ? ?Pre-Anesthetic Checklist: , timeout performed,  Correct Patient, Correct Site, Correct Laterality,  Correct Procedure,, site marked,  Risks and benefits discussed,  Surgical consent,  Pre-op evaluation,  At surgeon's request and post-op pain management ? ?Laterality: Right ? ?Prep: chloraprep     ?  ?Needles:  ?Injection technique: Single-shot ? ?Needle Type: Echogenic Stimulator Needle   ? ? ?Needle Length: 9cm  ?Needle Gauge: 21  ? ? ? ?Additional Needles: ? ? ?Procedures:,,,, ultrasound used (permanent image in chart),,    ?Narrative:  ?Start time: 04/13/2022 12:30 PM ?End time: 04/13/2022 12:40 PM ?Injection made incrementally with aspirations every 5 mL. ? ?Performed by: Personally  ?Anesthesiologist: Leonides Grills, MD ? ?Additional Notes: ?Functioning IV was confirmed and monitors were applied. A time-out was performed. Hand hygiene and sterile gloves were used. The thigh was placed in a frog-leg position and prepped in a sterile fashion. A 29mm 21ga Arrow echogenic stimulator needle was placed using ultrasound guidance.  Negative aspiration and negative test dose prior to incremental administration of local anesthetic. The patient tolerated the procedure well. ? ? ? ? ? ?

## 2022-04-13 NOTE — Anesthesia Procedure Notes (Signed)
Procedure Name: [place] ?Date/Time: 04/13/2022 1:35 PM ?Performed by: Eben Burow, CRNA ?Pre-anesthesia Checklist: Patient identified, Emergency Drugs available, Suction available, Patient being monitored and Timeout performed ?Oxygen Delivery Method: Simple face mask ?Placement Confirmation: positive ETCO2 ? ? ? ? ?

## 2022-04-13 NOTE — Op Note (Signed)
NAME: Elijah Watkins, Elijah Watkins ?MEDICAL RECORD NO: [id number] ?ACCOUNT NO: [id number] ?DATE OF BIRTH: July 26, 1966 ?FACILITY: [facility] ?LOCATION: [facility] ?PHYSICIAN: Johnn Hai, MD ? ?Operative Report  ? ?DATE OF PROCEDURE: 04/13/2022 ? ?PREOPERATIVE DIAGNOSES:  End-stage osteoarthrosis, right knee.  Degenerative joint disease of the left knee. ? ?POSTOPERATIVE DIAGNOSES:  End-stage osteoarthrosis, right knee.  Degenerative joint disease of the left knee. ? ?PROCEDURE PERFORMED:  Right total knee arthroplasty utilizing Attune rotating platform 7 femur, 7 tibia, 7 mm insert, 38 patella.  Corticosteroid injection of the left knee. ? ?ANESTHESIA:  Spinal. ? ?ASSISTANT:  Lacie Draft, PA. ? ?HISTORY:  A 56 with end-stage osteoarthrosis medial compartment, refractory to conservative treatment, bone-on-bone, negative affect whose activities of daily living indicated for replacement of the degenerated joint.  Risks and benefits discussed  ?including bleeding, infection, damage to neurovascular structures, no change in symptoms, worsening symptoms, DVT, PE, anesthetic complications, etc. ? ?DESCRIPTION OF PROCEDURE:  With the patient in supine position.  After induction of adequate spinal anesthesia, 3 grams Kefzol, the right lower extremity was prepped and draped and exsanguinated in usual sterile fashion.  Thigh tourniquet inflated to 225 ? mmHg.  Midline incision was then made over the knee.  Full thickness flaps developed.  Median parapatellar arthrotomy performed.  Soft tissues were elevated medially, preserving the MCL.  Patella was gently everted, knee was flexed.  Tricompartmental  ?osteoarthrosis, particularly medial compartment bone-on-bone was noted.  Leksell rongeur utilized to remove osteophytes.  Remnants of medial and lateral meniscus and ACL.  A notch was placed above the femoral notch.  A starting point for the femoral  ?drill, which was drilled in line with the femoral canal.  I then irrigated the canal used  the T handle within the canal.  Intramedullary guide 5 degree, 9 off the distal femur as the patient had a slight hyperextension of his knee.  This was then pinned and performed a  ?distal femoral cut.  This was then sized off the anterior cortex to a 7, pinned in 3 degrees of external rotation.  Following the block placement we performed the anterior, posterior and chamfer cuts.  Soft tissues protected at all times.  The patient  ?had a significantly hardened bone.  Tibia was subluxed.  Remnants of the menisci posteriorly were removed.  Geniculates were cauterized.  At the low side, bone was medially. 2 off the defect medially was selected.  External alignment guide parallel to  ?the shaft bisecting the tibiotalar joint, 3 degrees slope.  This was pinned and performed our tibial cut.  Extension block showed with a 6 there was slight hyperextension, but good flexion gap.  I flexed the knee, everted the patella, subluxed the tibia, ? sized to a 7 maximizing the coverage just to the medial aspect of the tibial tubercle.  This was pinned harvested bone centrally and impacted into the distal femur.  This was then drilled centrally in the tibia, pin guide was then placed.  Attention  ?turned to the femur.  Box cut jig applied bisecting the canals.  Box cut was performed again, significantly, hard and eburnated bone.  I then placed a trial femur, drilled our lug holes.  I used a 6 mm insert, reduced the knee and I had full extension  ?and full flexion, and good stability to varus valgus stressing at 0-30 degrees, negative anterior drawer.  I everted the patella.  He had a bipartite patella superior lateral corner.  Slightly was somewhat asymmetric.  Lateral facet narrowed.  External  ?alignment patellar jig was then used.  It measured 25, I planed it to a 15.  Curetted a portion of the cartilage. Sized to a 38, medializing our peg holes.  I used a trial paddle parallel to the joint surface.  I drilled the peg holes,  placed a patellar  ?component, reduced it and had excellent patellofemoral tracking.  I then removed all instrumentation.  Bone was placed in the distal femur.  Checked posteriorly.  Remnants of medial and lateral menisci removed.  Geniculates were cauterized.  Capsule and  ?popliteus was intact.  Pulsatile lavage was used to clean the joint.  The knee was then flexed, patella everted.  The subluxed all surfaces thoroughly dried.  Cement was mixed on the back table under vacuum.  Cement was placed in the proximal tibia and I ? digitally pressurized the cement.  Cement was placed on the tibial tray and impacted into the tibia.  Redundant cement removed, I cemented femoral component with cement on the component and the femur impacted and redundant cement removed.  I placed a 7  ?insert, reduced the knee to full extension and held in axial load throughout the curing of the cement.  I cemented and clamped the patella.  Redundant cement removed.  Marcaine with epinephrine was placed in the joint.  The joint was covered during the  ?curing of the cement.  Following curing of the cement, tourniquet was deflated at 70 minutes.  Any minor bleeding was cauterized.  Following this, we flexed the knee and removed the insert.  All redundant cement removed.  Copiously irrigated the joint  ?with pulsatile lavage followed by process and antibiotic irrigant.  I then selected a 7 insert placed it reduced it and I had full extension, full flexion, good stability to varus valgus stressing at 0-30 degrees, negative anterior drawer.  I then flexed ? the knee slightly and reapproximated patellar arthrotomy with #1 Vicryl in interrupted figure-of-eight sutures, followed by running Stratafix.  Copiously irrigated the subcutaneous tissue, which closed the subcutaneous with 2-0 and skin with staples.   ?The patient had flexion to gravity at 90 degrees.  Sterile dressing applied, placed in immobilizer. ? ?Next we prepped the left knee and  under strict sterile conditions, injected him with 1 mL of Kenalog and lidocaine after sterile prep for osteoarthritis of his left knee. ? ?The patient was then transported to the recovery room in satisfactory condition. ? ?The patient tolerated the procedure well.  No complications. ? ?Environmental consultant was [employer], [place].  Blood loss was minimal. ? ? ?SHY ?D: 04/13/2022 4:36:18 pm T: 04/13/2022 5:13:00 pm  ?JOB: [id number]  ?

## 2022-04-13 NOTE — Brief Op Note (Signed)
04/13/2022 ? ?1:09 PM ? ?PATIENT:  Elijah Watkins  56 y.o. male ? ?PRE-OPERATIVE DIAGNOSIS:  Right knee degenerative joint disease ? ?POST-OPERATIVE DIAGNOSIS:  * No post-op diagnosis entered * ? ?PROCEDURE:  Procedure(s): ?TOTAL KNEE ARTHROPLASTY (Right) ? ?SURGEON:  Surgeon(s) and Role: ?   Susa Day, MD - Primary ? ?PHYSICIAN ASSISTANT:  ?  ?2 ?ASSISTANTS: Phoebe Sharps  ? ?ANESTHESIA:   spinal ? ?EBL:  min  ? ?BLOOD ADMINISTERED:none ? ?DRAINS: none  ? ?LOCAL MEDICATIONS USED:  MARCAINE    ? ?SPECIMEN:  No Specimen ? ?DISPOSITION OF SPECIMEN:  N/A ? ?COUNTS:  YES ? ?TOURNIQUET:  * Missing tourniquet times found for documented tourniquets in log: [id number] * ? ?DICTATION: .Other Dictation: Dictation Number [id number].... ? ?PLAN OF CARE: Admit for overnight observation ? ?PATIENT DISPOSITION:  PACU - hemodynamically stable. ?  ?Delay start of Pharmacological VTE agent (>24hrs) due to surgical blood loss or risk of bleeding: no ? ?

## 2022-04-13 NOTE — Anesthesia Preprocedure Evaluation (Addendum)
Anesthesia Evaluation  ?Patient identified by MRN, date of birth, ID band ?Patient awake ? ? ? ?Reviewed: ?Allergy & Precautions, NPO status , Patient's Chart, lab work & pertinent test results ? ?Airway ?Mallampati: III ? ?TM Distance: >3 FB ?Neck ROM: Full ? ? ? Dental ? ?(+) Chipped,  ?  ?Pulmonary ?neg pulmonary ROS,  ?  ?Pulmonary exam normal ? ? ? ? ? ? ? Cardiovascular ?hypertension, Pt. on home beta blockers and Pt. on medications ?+ CAD, + Past MI, + Cardiac Stents and + DVT  ?Normal cardiovascular exam ? ? ?  ?Neuro/Psych ?PSYCHIATRIC DISORDERS Anxiety Depression TIA  ? GI/Hepatic ?Neg liver ROS, GERD  Medicated and Controlled,  ?Endo/Other  ?diabetes, Insulin DependentMorbid obesity ? Renal/GU ?Renal disease  ? ?  ?Musculoskeletal ? ?(+) Arthritis , Gout  ? Abdominal ?(+) + obese,   ?Peds ? Hematology ? ?(+) Blood dyscrasia, ,   ?Anesthesia Other Findings ?Right knee degenerative joint disease ? Reproductive/Obstetrics ? ?  ? ? ? ? ? ? ? ? ? ? ? ? ? ?  ?  ? ? ? ? ? ? ? ?Anesthesia Physical ?Anesthesia Plan ? ?ASA: 3 ? ?Anesthesia Plan: Regional and Spinal  ? ?Post-op Pain Management:   ? ?Induction: Intravenous ? ?PONV Risk Score and Plan: 1 and Ondansetron, Dexamethasone, Midazolam and Treatment may vary due to age or medical condition ? ?Airway Management Planned: Simple Face Mask ? ?Additional Equipment:  ? ?Intra-op Plan:  ? ?Post-operative Plan:  ? ?Informed Consent: I have reviewed the patients History and Physical, chart, labs and discussed the procedure including the risks, benefits and alternatives for the proposed anesthesia with the patient or authorized representative who has indicated his/her understanding and acceptance.  ? ? ? ?Dental advisory given ? ?Plan Discussed with: CRNA ? ?Anesthesia Plan Comments:   ? ? ? ? ? ?Anesthesia Quick Evaluation ? ?

## 2022-04-14 ENCOUNTER — Encounter (HOSPITAL_COMMUNITY): Payer: Self-pay | Admitting: Specialist

## 2022-04-14 ENCOUNTER — Other Ambulatory Visit: Payer: Self-pay

## 2022-04-14 DIAGNOSIS — E119 Type 2 diabetes mellitus without complications: Secondary | ICD-10-CM | POA: Diagnosis not present

## 2022-04-14 DIAGNOSIS — M1711 Unilateral primary osteoarthritis, right knee: Secondary | ICD-10-CM | POA: Diagnosis not present

## 2022-04-14 LAB — BASIC METABOLIC PANEL
Anion gap: 9 (ref 5–15)
BUN: 22 mg/dL — ABNORMAL HIGH (ref 6–20)
CO2: 25 mmol/L (ref 22–32)
Calcium: 8.2 mg/dL — ABNORMAL LOW (ref 8.9–10.3)
Chloride: 98 mmol/L (ref 98–111)
Creatinine, Ser: 1.68 mg/dL — ABNORMAL HIGH (ref 0.61–1.24)
GFR, Estimated: 47 mL/min — ABNORMAL LOW (ref >60)
Glucose, Bld: 335 mg/dL — ABNORMAL HIGH (ref 70–99)
Potassium: 5.2 mmol/L — ABNORMAL HIGH (ref 3.5–5.1)
Sodium: 132 mmol/L — ABNORMAL LOW (ref 135–145)

## 2022-04-14 LAB — CBC
HCT: 42.6 % (ref 39.0–52.0)
Hemoglobin: 14.2 g/dL (ref 13.0–17.0)
MCH: 30.8 pg (ref 26.0–34.0)
MCHC: 33.3 g/dL (ref 30.0–36.0)
MCV: 92.4 fL (ref 80.0–100.0)
Platelets: 154 10*3/uL (ref 150–400)
RBC: 4.61 MIL/uL (ref 4.22–5.81)
RDW: 12.3 % (ref 11.5–15.5)
WBC: 11.4 10*3/uL — ABNORMAL HIGH (ref 4.0–10.5)
nRBC: 0 % (ref 0.0–0.2)

## 2022-04-14 LAB — GLUCOSE, CAPILLARY
Glucose-Capillary: 300 mg/dL — ABNORMAL HIGH (ref 70–99)
Glucose-Capillary: 263 mg/dL — ABNORMAL HIGH (ref 70–99)
Glucose-Capillary: 234 mg/dL — ABNORMAL HIGH (ref 70–99)
Glucose-Capillary: 152 mg/dL — ABNORMAL HIGH (ref 70–99)

## 2022-04-14 MED ORDER — SODIUM CHLORIDE 0.9 % IV SOLN: INTRAVENOUS | Status: DC

## 2022-04-14 MED ORDER — SODIUM ZIRCONIUM CYCLOSILICATE 5 G PO PACK
5.0000 g | PACK | Freq: Once | ORAL | Status: AC
  Administered 2022-04-14: 5 g via ORAL
  Filled 2022-04-14: qty 1

## 2022-04-14 MED ORDER — INSULIN ASPART 100 UNIT/ML IJ SOLN
3.0000 [IU] | Freq: Three times a day (TID) | INTRAMUSCULAR | Status: DC
  Administered 2022-04-14 – 2022-04-16 (×5): 3 [IU] via SUBCUTANEOUS

## 2022-04-14 MED ORDER — HYDRALAZINE HCL 25 MG PO TABS
25.0000 mg | ORAL_TABLET | Freq: Four times a day (QID) | ORAL | Status: DC | PRN
  Filled 2022-04-14: qty 1

## 2022-04-14 NOTE — TOC Transition Note (Signed)
Transition of Care (TOC) - CM/SW Discharge Note ? ?Patient Details  ?Name: Elijah Watkins ?MRN: [id number] ?Date of Birth: [birth year] ? ?Transition of Care (TOC) CM/SW Contact:  ?Sherie Don, LCSW ?Phone Number: ?04/14/2022, 11:31 AM ? ?Clinical Narrative: Patient is expected to discharge home after working with PT. CSW met with patient to review discharge plan and needs. Patient will go home with OPPT at [facility]. Patient will need a rolling walker, which was ordered from [place] with [employer]. [employer] to deliver walker to room. TOC signing off. ? ?Final next level of care: OP Rehab ?Barriers to Discharge: No Barriers Identified ? ?Patient Goals and CMS Choice ?Patient states their goals for this hospitalization and ongoing recovery are:: Discharge home with OPPT at [facility] ?CMS Medicare.gov Compare Post Acute Care list provided to:: Patient ?Choice offered to / list presented to : Patient ? ?Discharge Plan and Services       ?DME Arranged: Walker rolling ?DME Agency: [employer] ?Date DME Agency Contacted: 04/14/22 ?Time DME Agency Contacted: 2023 ?Representative spoke with at DME Agency: Andee Poles ? ?Readmission Risk Interventions ?   ? View : No data to display.  ?  ?  ?  ? ?

## 2022-04-14 NOTE — Progress Notes (Signed)
Physical Therapy Treatment ?Patient Details ?Name: Elijah Watkins ?MRN: [id number] ?DOB: 20-Aug-1966 ?Today's Date: 04/14/2022 ? ? ?History of Present Illness Patient is 56 y.o. male s/p Rt TKA on 04/13/22 with PMH significant for OA, CAD, DM, GERD, hx of DVT, HTN, HLD, MI. ? ?  ?PT Comments  ? ? Patient progressing well with mobility and demonstrated good carryover for safe technique for transfers with RW. Pt ambulated increased distance this session of ~110' with min guard and cues for step to pattern to improve UE use for support during Rt LE stance phase to reduce antalgia. EOS pt returned to bed to rest and reviewed supine exercises. Will continue to progress during acute stay as able in preparation for safe discharge home. ? ?  ?Recommendations for follow up therapy are one component of a multi-disciplinary discharge planning process, led by the attending physician.  Recommendations may be updated based on patient status, additional functional criteria and insurance authorization. ? ?Follow Up Recommendations ? Follow physician's recommendations for discharge plan and follow up therapies ?  ?  ?Assistance Recommended at Discharge Intermittent Supervision/Assistance  ?Patient can return home with the following A little help with walking and/or transfers;A little help with bathing/dressing/bathroom;Assistance with cooking/housework;Direct supervision/assist for medications management;Assist for transportation;Help with stairs or ramp for entrance ?  ?Equipment Recommendations ? Rolling walker (2 wheels)  ?  ?Recommendations for Other Services   ? ? ?  ?Precautions / Restrictions Precautions ?Precautions: Fall ?Precaution Comments: 2 falls in last 6 months, knee gave out while walking ?Restrictions ?Weight Bearing Restrictions: No ?Other Position/Activity Restrictions: WBAT  ?  ? ?Mobility ? Bed Mobility ?Overal bed mobility: Needs Assistance ?Bed Mobility: Sit to Supine ?  ?  ?  ?Sit to supine: Min assist ?   ?General bed mobility comments: OOB in recliner. Min assist to raise Rt LE into bed and return to supine. ?  ? ?Transfers ?Overall transfer level: Needs assistance ?Equipment used: Rolling walker (2 wheels) ?Transfers: Sit to/from Stand ?Sit to Stand: Min guard ?  ?Step pivot transfers: Min assist ?  ?  ?  ?General transfer comment: pt able to power up from recliner with bil UE use, no assist needed. guarding for safety. verbal cues to extend Rt knee when sitting. ?  ? ?Ambulation/Gait ?Ambulation/Gait assistance: Min assist, Min guard ?Gait Distance (Feet): 110 Feet ?Assistive device: Rolling walker (2 wheels) ?Gait Pattern/deviations: Step-to pattern, Decreased stride length, Decreased stance time - right, Decreased weight shift to right, Shuffle, Wide base of support ?Gait velocity: decr ?  ?  ?General Gait Details: pt required cues to continue step to pattern as he began step through pattern for short bout but this resulted in more antalgic gait and quality improved with step to pattern. pt required standing rest break due to fatigue. ? ? ?Stairs ?  ?  ?  ?  ?  ? ? ?Wheelchair Mobility ?  ? ?Modified Rankin (Stroke Patients Only) ?  ? ? ?  ?Balance Overall balance assessment: Needs assistance ?Sitting-balance support: Feet supported ?Sitting balance-Leahy Scale: Good ?  ?  ?Standing balance support: During functional activity, Bilateral upper extremity supported, Reliant on assistive device for balance ?Standing balance-Leahy Scale: Fair ?  ?  ?  ?  ?  ?  ?  ?  ?  ?  ?  ?  ?  ? ?  ?Cognition Arousal/Alertness: Awake/alert ?Behavior During Therapy: [facility] for tasks assessed/performed ?Overall Cognitive Status: Within Functional Limits for tasks assessed ?  ?  ?  ?  ?  ?  ?  ?  ?  ?  ?  ?  ?  ?  ?  ?  ?  ?  ?  ? ?  ?  Exercises Total Joint Exercises ?Ankle Circles/Pumps: AROM, Both, 10 reps ?Quad Sets: AROM, Right, 5 reps ?Heel Slides: AROM, 5 reps, Right ?Hip ABduction/ADduction: AROM, Right, 5 reps ? ?  ?General  Comments   ?  ?  ? ?Pertinent Vitals/Pain Pain Assessment ?Pain Assessment: 0-10 ?Pain Score: 7  ?Pain Location: Rt knee ?Pain Descriptors / Indicators: Aching, Dull, Discomfort ?Pain Intervention(s): Monitored during session, Limited activity within patient's tolerance, Repositioned, Premedicated before session  ? ? ?Home Living   ?  ?  ?  ?  ?  ?  ?  ?  ?  ?   ?  ?Prior Function    ?  ?  ?   ? ?PT Goals (current goals can now be found in the care plan section) Acute Rehab PT Goals ?Patient Stated Goal: get back to gym and indepeendence ?PT Goal Formulation: With patient ?Time For Goal Achievement: 04/21/22 ?Potential to Achieve Goals: Good ?Progress towards PT goals: Progressing toward goals ? ?  ?Frequency ? ? ? 7X/week ? ? ? ?  ?PT Plan Current plan remains appropriate  ? ? ?Co-evaluation   ?  ?  ?  ?  ? ?  ?AM-PAC PT "6 Clicks" Mobility   ?Outcome Measure ? Help needed turning from your back to your side while in a flat bed without using bedrails?: A Little ?Help needed moving from lying on your back to sitting on the side of a flat bed without using bedrails?: A Little ?Help needed moving to and from a bed to a chair (including a wheelchair)?: A Little ?Help needed standing up from a chair using your arms (e.g., wheelchair or bedside chair)?: A Little ?Help needed to walk in hospital room?: A Little ?Help needed climbing 3-5 steps with a railing? : A Little ?6 Click Score: 18 ? ?  ?End of Session Equipment Utilized During Treatment: Gait belt ?Activity Tolerance: Patient tolerated treatment well;Patient limited by pain ?Patient left: in chair;with call bell/phone within reach;with chair alarm set;with family/visitor present ?Nurse Communication: Mobility status ?PT Visit Diagnosis: Other abnormalities of gait and mobility (R26.89);Muscle weakness (generalized) (M62.81);Difficulty in walking, not elsewhere classified (R26.2) ?  ? ? ?Time: 1447-1510 ?PT Time Calculation (min) (ACUTE ONLY): 23 min ? ?Charges:   $Gait Training: 8-22 mins ?$Therapeutic Exercise: 8-22 mins          ?          ? ?Renaldo Fiddler PT, DPT ?Acute Rehabilitation Services ?Office [phone] ?Pager [phone]  ? ? ?[name] ?04/14/2022, 3:53 PM ? ?

## 2022-04-14 NOTE — Consult Note (Addendum)
? ?                                                                           TRH consult note ? ? ? Patient Demographics:  ? ? Elijah Watkins, is a 56 y.o. male  MRN: [id number]  DOB - 1966/02/28 ? ?Admit Date - 04/13/2022 ? ?Referring MD/NP/PA: Jene Every ? ?Outpatient Primary MD for the patient is Lavonda Jumbo, PA-C ? ?Reason for consult-management of uncontrolled diabetes mellitus ? ? HPI:  ? ? Elijah Watkins  is a 56 y.o. male,with history of diabetes mellitus type 2, CAD s/p stent placement, history of DVT, hypertension, hyperlipidemia who underwent right total knee arthroplasty on 04/13/2022, he also received corticosteroid injection of the left knee.  This morning patient found to have elevated blood glucose 335.  Hospitalist team consulted for management of diabetes mellitus. ? ?At home patient takes Novolin 70/30, 70 units subcu twice a day.  As per patient he did not take Novolin 70/30 yesterday as he was supposed to undergo surgery.  He also got cortisone injection in the left knee.  He denies chest pain ,shortness of breath ?Denies nausea vomiting or diarrhea ?Endorses increased frequency of urination as he was drinking more fluids before surgery to bring his blood sugar down ?His last hemoglobin A1c on 4/25 was 6.8 ?He is followed by his PCP to manage diabetes mellitus. ? ?At this time patient has been started on Modified diet and is eating well. ? ? ? Review of systems:  ?  ?In addition to the HPI above,  ? ? ?All other systems reviewed and are negative. ? ? ? Past History of the following :  ? ? ?Past Medical History:  ?Diagnosis Date  ? Anger   ? anger issues  ? Arthritis   ? CAD (coronary artery disease)   ? Diabetes mellitus   ? IDDM - fasting 70-120s  ? GERD (gastroesophageal reflux disease)   ? History of concussion   ? History of DVT (deep vein thrombosis) 03/2005  ? History of kidney stones   ? History of peptic ulcer age 58  ? Hyperlipidemia   ? Hypertension   ? under control, has been  on med. > 7 yrs.  ? Myocardial infarction [facility])   ? stents age 75  ? Nephrolithiasis   ? III  ? Overweight(278.02)   ? Pilonidal cyst 05/2012  ? area is open, not draining  ? PONV (postoperative nausea and vomiting)   ? S/P coronary artery stent placement 2001, 05/2011  ? Seasonal allergies   ?   ? ?Past Surgical History:  ?Procedure Laterality Date  ? CARDIAC CATHETERIZATION  10/06/2010  ? CARPAL TUNNEL RELEASE Right 2013  ? CHOLECYSTECTOMY N/A 08/18/2013  ? Procedure: LAPAROSCOPIC CHOLECYSTECTOMY WITH INTRAOPERATIVE CHOLANGIOGRAM;  Surgeon: Robyne Askew, MD;  Location[facility] OR;  Service: General;  Laterality: N/A;  ? CHONDROPLASTY  08/04/2004  ? right knee  ? CORONARY ANGIOPLASTY WITH STENT PLACEMENT    ? x 2; last time 05/2011  ? CORONARY STENT INTERVENTION N/A 08/22/2018  ? Procedure: CORONARY STENT INTERVENTION;  Surgeon: Corky Crafts, MD;  Location: [facility] INVASIVE CV LAB;  Service: Cardiovascular;  Laterality: N/A;  ?  DISTAL BICEPS TENDON REPAIR Left 02/19/2016  ? Procedure: DISTAL BICEPS TENDON REPAIR;  Surgeon: Jodi GeraldsJohn Graves, MD;  Location: [facility];  Service: Orthopedics;  Laterality: Left;  ? KIDNEY STONE SURGERY  1988 - 1998  ? x 3  ? knee arthroscopic    ? left knee  ? KNEE CARTILAGE SURGERY  10/2003, 11/2003  ? Also had bursa removed  ? LEFT HEART CATH AND CORONARY ANGIOGRAPHY N/A 08/22/2018  ? Procedure: LEFT HEART CATH AND CORONARY ANGIOGRAPHY;  Surgeon: Corky CraftsVaranasi, Jayadeep S, MD;  [facility];  Service: Cardiovascular;  Laterality: N/A;  ? PILONIDAL CYST DRAINAGE  05/19/2012  ? Procedure: IRRIGATION AND DEBRIDEMENT PILONIDAL CYST;  Surgeon: Robyne AskewPaul S Toth III, MD;  Location: [facility];  Service: General;  Laterality: N/A;  I & D Pilonidal abscess, placement of acell  ? PREPATELLAR BURSA EXCISION  12/05/2004  ? right  ? WRIST SURGERY  02/11/1997  ? right  ? ? ? ? Social History:  ? ? ?  ?Social History  ? ?Tobacco Use  ? Smoking status: Never  ?  Smokeless tobacco: Never  ?Substance Use Topics  ? Alcohol use: Yes  ?  Comment: rare  ?  ? ? ? Family History :  ? ?  ?Family History  ?Problem Relation Age of Onset  ? Diabetes Mother 5665  ? Hypertension Father   ? Alcohol abuse Father [id number]  ? ? ? ? Home Medications:  ? ?Prior to Admission medications   ?Medication Sig Start Date End Date Taking? Authorizing Provider  ?allopurinol (ZYLOPRIM) 300 MG tablet Take 300 mg by mouth daily.   Yes [provider]  ?ALPRAZolam Prudy Feeler(XANAX) 1 MG tablet Take 2 mg by mouth 2 (two) times daily.   Yes [provider]  ?aspirin EC 81 MG tablet Take 1 tablet (81 mg total) by mouth 2 (two) times daily after a meal. Day after surgery 04/13/22  Yes Jene EveryBeane, Jeffrey, MD  ?citalopram (CELEXA) 40 MG tablet Take 40 mg by mouth daily.   Yes [provider]  ?clopidogrel (PLAVIX) 75 MG tablet Take 1 tablet (75 mg total) by mouth daily. 07/04/21  Yes Rhetta MuraSamtani, Jai-Gurmukh, MD  ?docusate sodium (COLACE) 100 MG capsule Take 1 capsule (100 mg total) by mouth 2 (two) times daily as needed for mild constipation. 04/13/22  Yes Jene EveryBeane, Jeffrey, MD  ?gabapentin (NEURONTIN) 600 MG tablet Take 600 mg by mouth 2 (two) times daily.   Yes [provider]  ?HYDROmorphone (DILAUDID) 2 MG tablet Take 1-2 tablets (2-4 mg total) by mouth every 4 (four) hours as needed for up to 7 days for severe pain. 04/13/22 04/20/22 Yes Jene EveryBeane, Jeffrey, MD  ?insulin NPH-regular Human (NOVOLIN 70/30) (70-30) 100 UNIT/ML injection Inject 70 Units into the skin 2 (two) times daily with a meal.   Yes [provider]  ?lisinopril (PRINIVIL,ZESTRIL) 5 MG tablet Take 5 mg by mouth daily.   Yes [provider]  ?metoprolol (LOPRESSOR) 50 MG tablet Take 50 mg by mouth 2 (two) times daily.   Yes [provider]  ?omeprazole (PRILOSEC) 20 MG capsule Take 20 mg by mouth daily.   Yes [provider]  ?polyethylene glycol (MIRALAX / GLYCOLAX) 17 g packet Take 17 g by mouth daily. 04/13/22   Yes Jene EveryBeane, Jeffrey, MD  ?nitroGLYCERIN (NITROSTAT) 0.4 MG SL tablet Place 0.4 mg under the tongue every 5 (five) minutes as needed. x3 doses as needed for chest pain  [provider]  ?zolpidem (AMBIEN) 10 MG tablet Take 10 mg by mouth at bedtime.     [provider]  ? ? ? Allergies:  ? ?  ?Allergies  ?Allergen Reactions  ? Latex Hives, Swelling and Other (See Comments)  ?  Dry cough  ? ? ? Physical Exam:  ? ?Vitals ? ?Blood pressure (!) 145/90, pulse 74, temperature 98.1 ?F (36.7 ?C), temperature source Oral, resp. rate 18, height 5\' 11"  (1.803 m), weight (!) 137.2 kg, SpO2 96 %. ? ?1.  General: ?Appears in no acute distress ? ?2. Psychiatric: ?Alert, oriented x3, intact insight and judgment ? ?3. Neurologic: ?Cranial nerves II through XII grossly intact, no focal deficit noted ? ?4. HEENMT:  ?Atraumatic normocephalic, extraocular muscles are intact ? ?5. Respiratory : ?Clear to auscultation bilaterally ? ?6. Cardiovascular : ?S1-S2, regular, no murmur auscultated ? ?7. Gastrointestinal:  ?Abdomen is soft, nontender, no organomegaly ? ?8. Skin:  ?No rashes noted ? ?9.Musculoskeletal:  ?Right leg in knee immobilizer ? ? ? Data Review:  ? ? CBC ?Recent Labs  ?Lab 04/14/22 ?0350  ?WBC 11.4*  ?HGB 14.2  ?HCT 42.6  ?PLT 154  ?MCV 92.4  ?MCH 30.8  ?MCHC 33.3  ?RDW 12.3  ? ?------------------------------------------------------------------------------------------------------------------ ? ?Results for orders placed or performed during the hospital encounter of 04/13/22 (from the past 48 hour(s))  ?Glucose, capillary     Status: Abnormal  ? Collection Time: 04/13/22  4:30 PM  ?Result Value Ref Range  ? Glucose-Capillary 161 (H) 70 - 99 mg/dL  ?  Comment: Glucose reference range applies only to samples taken after fasting for at least 8 hours.  ?Glucose, capillary     Status: Abnormal  ? Collection Time: 04/13/22  9:50 PM  ?Result Value Ref Range  ? Glucose-Capillary 277 (H) 70 - 99 mg/dL  ?  Comment:  Glucose reference range applies only to samples taken after fasting for at least 8 hours.  ?CBC     Status: Abnormal  ? Collection Time: 04/14/22  3:50 AM  ?Result Value Ref Range  ? WBC 11.4 (H) 4.0 - 10.5

## 2022-04-14 NOTE — Evaluation (Signed)
Physical Therapy Evaluation ?Patient Details ?Name: Elijah Watkins ?MRN: [id number] ?DOB: 11-19-1966 ?Today's Date: 04/14/2022 ? ?History of Present Illness ? Patient is 56 y.o. male s/p Rt TKA on 04/13/22 with PMH significant for OA, CAD, DM, GERD, hx of DVT, HTN, HLD, MI.  ?Clinical Impression ? CORBY VANDENBERGHE is a 56 y.o. male POD 1 s/p Rt TKA. Patient reports independence with mobility at baseline. Patient is now limited by functional impairments (see PT problem list below) and requires min guard  for transfers and gait with RW. Patient was able to ambulate 30 feet with RW and min assist. Patient instructed in exercise to facilitate ROM and circulation to manage edema and reduce risk of DVT. Patient will benefit from continued skilled PT interventions to address impairments and progress towards PLOF. Acute PT will follow to progress mobility and stair training in preparation for safe discharge home.    ?   ? ?Recommendations for follow up therapy are one component of a multi-disciplinary discharge planning process, led by the attending physician.  Recommendations may be updated based on patient status, additional functional criteria and insurance authorization. ? ?Follow Up Recommendations Follow physician's recommendations for discharge plan and follow up therapies ? ?  ?Assistance Recommended at Discharge Intermittent Supervision/Assistance  ?Patient can return home with the following ? A little help with walking and/or transfers;A little help with bathing/dressing/bathroom;Assistance with cooking/housework;Direct supervision/assist for medications management;Assist for transportation;Help with stairs or ramp for entrance ? ?  ?Equipment Recommendations Rolling walker (2 wheels)  ?Recommendations for Other Services ?    ?  ?Functional Status Assessment Patient has had a recent decline in their functional status and demonstrates the ability to make significant improvements in function in a reasonable and  predictable amount of time.  ? ?  ?Precautions / Restrictions Precautions ?Precautions: Fall ?Precaution Comments: 2 falls in last 6 months, knee gave out while walking ?Restrictions ?Weight Bearing Restrictions: No ?Other Position/Activity Restrictions: WBAT  ? ?  ? ?Mobility ? Bed Mobility ?Overal bed mobility: Needs Assistance ?  ?  ?  ?  ?  ?  ?General bed mobility comments: OOB on BSC then recliner ?  ? ?Transfers ?Overall transfer level: Needs assistance ?Equipment used: Rolling walker (2 wheels) ?Transfers: Sit to/from Stand, Bed to chair/wheelchair/BSC ?Sit to Stand: Min assist ?  ?Step pivot transfers: Min assist ?  ?  ?  ?General transfer comment: cues for safe hand placement for power up and to steady with rise. pt required assist to turn walker and cues for sequencing to move BSC>recliner. cues for extending Rt knee with sit. ?  ? ?Ambulation/Gait ?Ambulation/Gait assistance: Min assist ?Gait Distance (Feet): 30 Feet ?Assistive device: Rolling walker (2 wheels) ?Gait Pattern/deviations: Step-to pattern, Decreased stride length, Decreased stance time - right, Decreased weight shift to right, Shuffle, Wide base of support ?Gait velocity: decr ?  ?  ?General Gait Details: slow gait pattern with reduced hip/knee flexion and short step length bil. pt required assist to keep walker in safe position while advancing. ? ?Stairs ?  ?  ?  ?  ?  ? ?Wheelchair Mobility ?  ? ?Modified Rankin (Stroke Patients Only) ?  ? ?  ? ?Balance Overall balance assessment: Needs assistance ?Sitting-balance support: Feet supported ?Sitting balance-Leahy Scale: Good ?  ?  ?Standing balance support: During functional activity, Bilateral upper extremity supported, Reliant on assistive device for balance ?Standing balance-Leahy Scale: Fair ?  ?  ?  ?  ?  ?  ?  ?  ?  ?  ?  ?  ?   ? ? ? ?  Pertinent Vitals/Pain Pain Assessment ?Pain Assessment: 0-10 ?Pain Location: Rt knee ?Pain Descriptors / Indicators: Aching, Dull, Discomfort ?Pain  Intervention(s): Limited activity within patient's tolerance, Monitored during session, Premedicated before session, Repositioned  ? ? ?Home Living Family/patient expects to be discharged to:: Private residence ?Living Arrangements: Spouse/significant other ?Available Help at Discharge: Family ?Type of Home: House ?Home Access: Stairs to enter ?Entrance Stairs-Rails: Left;Right ?Entrance Stairs-Number of Steps: 4 ?  ?Home Layout: One level ?Home Equipment: None ?   ?  ?Prior Function Prior Level of Function : Independent/Modified Independent ?  ?  ?  ?  ?  ?  ?  ?  ?  ? ? ?Hand Dominance  ? Dominant Hand: Right ? ?  ?Extremity/Trunk Assessment  ? Upper Extremity Assessment ?Upper Extremity Assessment: Overall WFL for tasks assessed ?  ? ?Lower Extremity Assessment ?Lower Extremity Assessment: Overall WFL for tasks assessed;RLE deficits/detail ?RLE Deficits / Details: good quad activaiton, no extensor lag with SLR ?RLE Sensation: WNL ?RLE Coordination: WNL ?  ? ?Cervical / Trunk Assessment ?Cervical / Trunk Assessment: Other exceptions ?Cervical / Trunk Exceptions: habitus  ?Communication  ? Communication: No difficulties  ?Cognition Arousal/Alertness: Awake/alert ?Behavior During Therapy: [facility] for tasks assessed/performed ?Overall Cognitive Status: Within Functional Limits for tasks assessed ?  ?  ?  ?  ?  ?  ?  ?  ?  ?  ?  ?  ?  ?  ?  ?  ?  ?  ?  ? ?  ?General Comments   ? ?  ?Exercises Total Joint Exercises ?Ankle Circles/Pumps: AROM, Both, 10 reps ?Quad Sets: AROM, Right, 10 reps ?Heel Slides: AROM, 5 reps, Right  ? ?Assessment/Plan  ?  ?PT Assessment Patient needs continued PT services  ?PT Problem List Decreased strength;Decreased range of motion;Decreased activity tolerance;Decreased balance;Decreased mobility;Decreased knowledge of use of DME;Decreased knowledge of precautions ? ?   ?  ?PT Treatment Interventions DME instruction;Stair training;Gait training;Functional mobility training;Therapeutic  activities;Therapeutic exercise;Balance training;Patient/family education   ? ?PT Goals (Current goals can be found in the Care Plan section)  ?Acute Rehab PT Goals ?Patient Stated Goal: get back to gym and indepeendence ?PT Goal Formulation: With patient ?Time For Goal Achievement: 04/21/22 ?Potential to Achieve Goals: Good ? ?  ?Frequency 7X/week ?  ? ? ?Co-evaluation   ?  ?  ?  ?  ? ? ?  ?AM-PAC PT "6 Clicks" Mobility  ?Outcome Measure Help needed turning from your back to your side while in a flat bed without using bedrails?: A Little ?Help needed moving from lying on your back to sitting on the side of a flat bed without using bedrails?: A Little ?Help needed moving to and from a bed to a chair (including a wheelchair)?: A Little ?Help needed standing up from a chair using your arms (e.g., wheelchair or bedside chair)?: A Little ?Help needed to walk in hospital room?: A Little ?Help needed climbing 3-5 steps with a railing? : A Little ?6 Click Score: 18 ? ?  ?End of Session Equipment Utilized During Treatment: Gait belt ?Activity Tolerance: Patient tolerated treatment well;Patient limited by pain ?Patient left: in chair;with call bell/phone within reach;with chair alarm set;with family/visitor present ?Nurse Communication: Mobility status ?PT Visit Diagnosis: Other abnormalities of gait and mobility (R26.89);Muscle weakness (generalized) (M62.81);Difficulty in walking, not elsewhere classified (R26.2) ?  ? ?Time: 1610-9604 ?PT Time Calculation (min) (ACUTE ONLY): 35 min ? ? ?Charges:   PT Evaluation ?$PT Eval Low Complexity: 1 Low ?PT  Treatments ?$Gait Training: 8-22 mins ?  ?   ? ? ?Renaldo Fiddlerachel Q. PT, DPT ?Acute Rehabilitation Services ?Office [phone] ?Pager [phone]  ? ?Anitra LauthRachel E Quinn-Brown ?04/14/2022, 10:43 AM ? ?

## 2022-04-14 NOTE — Progress Notes (Signed)
Subjective: ?1 Day Post-Op Procedure(s) (LRB): ?TOTAL KNEE ARTHROPLASTY (Right) ?KNEE INJECTION (Left) ?Patient reports pain as 4 on 0-10 scale.   ?Denies CP or SOB.  Voiding without difficulty. Positive flatus. ?Objective: ?Vital signs in last 24 hours: ?Temp:  [97.8 ?F (36.6 ?C)-98.1 ?F (36.7 ?C)] 98.1 ?F (36.7 ?C) (05/02 0424) ?Pulse Rate:  [60-74] 74 (05/02 0424) ?Resp:  [12-18] 18 (05/02 0424) ?BP: (111-158)/(65-92) 145/90 (05/02 0424) ?SpO2:  [91 %-100 %] 96 % (05/02 0424) ?Weight:  [137.2 kg] 137.2 kg (05/02 0427) ? ?Intake/Output from previous day: ?05/01 0701 - 05/02 0700 ?In: 3709.3 [P.O.:720; I.V.:2689.3; IV Piggyback:300] ?Out: 3200 [Urine:3150; Blood:50] ?Intake/Output this shift: ?No intake/output data recorded. ? ?Recent Labs  ?  04/14/22 ?0350  ?HGB 14.2  ? ?Recent Labs  ?  04/14/22 ?0350  ?WBC 11.4*  ?RBC 4.61  ?HCT 42.6  ?PLT 154  ? ?Recent Labs  ?  04/14/22 ?0350  ?NA 132*  ?K 5.2*  ?CL 98  ?CO2 25  ?BUN 22*  ?CREATININE 1.68*  ?GLUCOSE 335*  ?CALCIUM 8.2*  ? ?No results for input(s): LABPT, INR in the last 72 hours. ? ?Neurologically intact ?Neurovascular intact ?Sensation intact distally ?Dorsiflexion/Plantar flexion intact ?Incision: dressing C/D/I ? ?Assessment/Plan: ? ?1 Day Post-Op Procedure(s) (LRB): ?TOTAL KNEE ARTHROPLASTY (Right) ?KNEE INJECTION (Left) ?Glucose elevated. Also K + ?Good UO ?Consult Medicine. Called. ?Advance diet ?Up with therapy ?D/C IV fluids ?Plan for discharge tomorrow ? ? ?Principal Problem: ?  Right knee DJD ?  ? ? ? ?Javier Docker ?04/14/2022, @[facility]   ?

## 2022-04-14 NOTE — Plan of Care (Signed)
  Problem: Activity: Goal: Ability to avoid complications of mobility impairment will improve Outcome: Progressing Goal: Range of joint motion will improve Outcome: Progressing   Problem: Pain Management: Goal: Pain level will decrease with appropriate interventions Outcome: Progressing   

## 2022-04-15 DIAGNOSIS — N1832 Chronic kidney disease, stage 3b: Secondary | ICD-10-CM

## 2022-04-15 DIAGNOSIS — E1165 Type 2 diabetes mellitus with hyperglycemia: Secondary | ICD-10-CM

## 2022-04-15 DIAGNOSIS — M1711 Unilateral primary osteoarthritis, right knee: Secondary | ICD-10-CM

## 2022-04-15 DIAGNOSIS — Z794 Long term (current) use of insulin: Secondary | ICD-10-CM

## 2022-04-15 LAB — CBC
HCT: 40.3 % (ref 39.0–52.0)
Hemoglobin: 13.5 g/dL (ref 13.0–17.0)
MCH: 31 pg (ref 26.0–34.0)
MCHC: 33.5 g/dL (ref 30.0–36.0)
MCV: 92.6 fL (ref 80.0–100.0)
Platelets: 182 10*3/uL (ref 150–400)
RBC: 4.35 MIL/uL (ref 4.22–5.81)
RDW: 12.6 % (ref 11.5–15.5)
WBC: 13 10*3/uL — ABNORMAL HIGH (ref 4.0–10.5)
nRBC: 0 % (ref 0.0–0.2)

## 2022-04-15 LAB — BASIC METABOLIC PANEL
Anion gap: 7 (ref 5–15)
BUN: 31 mg/dL — ABNORMAL HIGH (ref 6–20)
CO2: 28 mmol/L (ref 22–32)
Calcium: 8.7 mg/dL — ABNORMAL LOW (ref 8.9–10.3)
Chloride: 100 mmol/L (ref 98–111)
Creatinine, Ser: 1.59 mg/dL — ABNORMAL HIGH (ref 0.61–1.24)
GFR, Estimated: 51 mL/min — ABNORMAL LOW (ref >60)
Glucose, Bld: 181 mg/dL — ABNORMAL HIGH (ref 70–99)
Potassium: 4.1 mmol/L (ref 3.5–5.1)
Sodium: 135 mmol/L (ref 135–145)

## 2022-04-15 LAB — GLUCOSE, CAPILLARY
Glucose-Capillary: 182 mg/dL — ABNORMAL HIGH (ref 70–99)
Glucose-Capillary: 176 mg/dL — ABNORMAL HIGH (ref 70–99)
Glucose-Capillary: 136 mg/dL — ABNORMAL HIGH (ref 70–99)
Glucose-Capillary: 95 mg/dL (ref 70–99)

## 2022-04-15 MED ORDER — CLOPIDOGREL BISULFATE 75 MG PO TABS
75.0000 mg | ORAL_TABLET | Freq: Every day | ORAL | Status: DC
  Administered 2022-04-15 – 2022-04-16 (×2): 75 mg via ORAL
  Filled 2022-04-15 (×2): qty 1

## 2022-04-15 NOTE — Discharge Summary (Signed)
?Physician Discharge Summary ?  ?Patient ID: ?Elijah GanserWilliam C Engdahl ?MRN: [id number] ?DOB/AGE: [birth year] 56 y.o. ? ?Admit date: 04/13/2022 ?Discharge date: 04/15/22 ? ?Primary Diagnosis: Right knee osteoarthritis  ?Admission Diagnoses:  ?Past Medical History:  ?Diagnosis Date  ? Anger   ? anger issues  ? Arthritis   ? CAD (coronary artery disease)   ? Diabetes mellitus   ? IDDM - fasting 70-120s  ? GERD (gastroesophageal reflux disease)   ? History of concussion   ? History of DVT (deep vein thrombosis) 03/2005  ? History of kidney stones   ? History of peptic ulcer age 56  ? Hyperlipidemia   ? Hypertension   ? under control, has been on med. > 7 yrs.  ? Myocardial infarction [facility](HCC)   ? stents age 56  ? Nephrolithiasis   ? III  ? Overweight(278.02)   ? Pilonidal cyst 05/2012  ? area is open, not draining  ? PONV (postoperative nausea and vomiting)   ? S/P coronary artery stent placement 2001, 05/2011  ? Seasonal allergies   ? ?Discharge Diagnoses:   ?Principal Problem: ?  Right knee DJD ? ?Estimated body mass index is 42.19 kg/m? as calculated from the following: ?  Height as of this encounter: 5\' 11"  (1.803 m). ?  Weight as of this encounter: 137.2 kg. ? ?Procedure:  ?Procedure(s) (LRB): ?TOTAL KNEE ARTHROPLASTY (Right) ?KNEE INJECTION (Left)  ? ?Consults:  hospitalists ? ?HPI: see H&P ?Laboratory Data: ?Admission on 04/13/2022  ?Component Date Value Ref Range Status  ? Glucose-Capillary 04/13/2022 161 (H)  70 - 99 mg/dL Final  ? Glucose reference range applies only to samples taken after fasting for at least 8 hours.  ? WBC 04/14/2022 11.4 (H)  4.0 - 10.5 K/uL Final  ? RBC 04/14/2022 4.61  4.22 - 5.81 MIL/uL Final  ? Hemoglobin 04/14/2022 14.2  13.0 - 17.0 g/dL Final  ? HCT 78/29/562105/01/2022 42.6  39.0 - 52.0 % Final  ? MCV 04/14/2022 92.4  80.0 - 100.0 fL Final  ? MCH 04/14/2022 30.8  26.0 - 34.0 pg Final  ? MCHC 04/14/2022 33.3  30.0 - 36.0 g/dL Final  ? RDW 30/86/578405/01/2022 12.3  11.5 - 15.5 % Final  ? Platelets 04/14/2022 154  150  - 400 K/uL Final  ? nRBC 04/14/2022 0.0  0.0 - 0.2 % Final  ? Performed at [facility], [address] [place], [place] [address]  ? Sodium 04/14/2022 132 (L)  135 - 145 mmol/L Final  ? Potassium 04/14/2022 5.2 (H)  3.5 - 5.1 mmol/L Final  ? Chloride 04/14/2022 98  98 - 111 mmol/L Final  ? CO2 04/14/2022 25  22 - 32 mmol/L Final  ? Glucose, Bld 04/14/2022 335 (H)  70 - 99 mg/dL Final  ? Glucose reference range applies only to samples taken after fasting for at least 8 hours.  ? BUN 04/14/2022 22 (H)  6 - 20 mg/dL Final  ? Creatinine, Ser 04/14/2022 1.68 (H)  0.61 - 1.24 mg/dL Final  ? Calcium 52/84/132405/01/2022 8.2 (L)  8.9 - 10.3 mg/dL Final  ? GFR, Estimated 04/14/2022 47 (L)  >60 mL/min Final  ? Comment: (NOTE) ?Calculated using the CKD-EPI Creatinine Equation (2021) ?  ? Anion gap 04/14/2022 9  5 - 15 Final  ? Performed at [facility], [address] [place], [place] [address]  ? Glucose-Capillary 04/13/2022 277 (H)  70 - 99 mg/dL Final  ? Glucose reference range applies only to samples taken  after fasting for at least 8 hours.  ? Glucose-Capillary 04/14/2022 300 (H)  70 - 99 mg/dL Final  ? Glucose reference range applies only to samples taken after fasting for at least 8 hours.  ? Glucose-Capillary 04/14/2022 263 (H)  70 - 99 mg/dL Final  ? Glucose reference range applies only to samples taken after fasting for at least 8 hours.  ? WBC 04/15/2022 13.0 (H)  4.0 - 10.5 K/uL Final  ? RBC 04/15/2022 4.35  4.22 - 5.81 MIL/uL Final  ? Hemoglobin 04/15/2022 13.5  13.0 - 17.0 g/dL Final  ? HCT 67/67/2094 40.3  39.0 - 52.0 % Final  ? MCV 04/15/2022 92.6  80.0 - 100.0 fL Final  ? MCH 04/15/2022 31.0  26.0 - 34.0 pg Final  ? MCHC 04/15/2022 33.5  30.0 - 36.0 g/dL Final  ? RDW 70/96/2836 12.6  11.5 - 15.5 % Final  ? Platelets 04/15/2022 182  150 - 400 K/uL Final  ? nRBC 04/15/2022 0.0  0.0 - 0.2 % Final  ? Performed at [facility], [address], [place], [place]  [address]  ? Sodium 04/15/2022 135  135 - 145 mmol/L Final  ? Potassium 04/15/2022 4.1  3.5 - 5.1 mmol/L Final  ? Comment: NO VISIBLE HEMOLYSIS ?DELTA CHECK NOTED ?  ? Chloride 04/15/2022 100  98 - 111 mmol/L Final  ? CO2 04/15/2022 28  22 - 32 mmol/L Final  ? Glucose, Bld 04/15/2022 181 (H)  70 - 99 mg/dL Final  ? Glucose reference range applies only to samples taken after fasting for at least 8 hours.  ? BUN 04/15/2022 31 (H)  6 - 20 mg/dL Final  ? Creatinine, Ser 04/15/2022 1.59 (H)  0.61 - 1.24 mg/dL Final  ? Calcium 65/46/5035 8.7 (L)  8.9 - 10.3 mg/dL Final  ? GFR, Estimated 04/15/2022 51 (L)  >60 mL/min Final  ? Comment: (NOTE) ?Calculated using the CKD-EPI Creatinine Equation (2021) ?  ? Anion gap 04/15/2022 7  5 - 15 Final  ? Performed at [facility], [address] [place], [place] [address]  ? Glucose-Capillary 04/14/2022 234 (H)  70 - 99 mg/dL Final  ? Glucose reference range applies only to samples taken after fasting for at least 8 hours.  ? Glucose-Capillary 04/14/2022 152 (H)  70 - 99 mg/dL Final  ? Glucose reference range applies only to samples taken after fasting for at least 8 hours.  ? Glucose-Capillary 04/15/2022 182 (H)  70 - 99 mg/dL Final  ? Glucose reference range applies only to samples taken after fasting for at least 8 hours.  ?Hospital Outpatient Visit on 04/07/2022  ?Component Date Value Ref Range Status  ? Sodium 04/07/2022 140  135 - 145 mmol/L Final  ? Potassium 04/07/2022 4.3  3.5 - 5.1 mmol/L Final  ? Chloride 04/07/2022 103  98 - 111 mmol/L Final  ? CO2 04/07/2022 29  22 - 32 mmol/L Final  ? Glucose, Bld 04/07/2022 98  70 - 99 mg/dL Final  ? Glucose reference range applies only to samples taken after fasting for at least 8 hours.  ? BUN 04/07/2022 13  6 - 20 mg/dL Final  ? Creatinine, Ser 04/07/2022 1.28 (H)  0.61 - 1.24 mg/dL Final  ? Calcium 12/75/1700 8.8 (L)  8.9 - 10.3 mg/dL Final  ? GFR, Estimated 04/07/2022 >60  >60 mL/min Final  ? Comment:  (NOTE) ?Calculated using the CKD-EPI Creatinine Equation (2021) ?  ? Anion gap 04/07/2022 8  5 - 15 Final  ? Performed at [facility], [address] [place], [place] [address]  ? WBC 04/07/2022 7.3  4.0 - 10.5 K/uL Final  ? RBC 04/07/2022 4.64  4.22 - 5.81 MIL/uL Final  ? Hemoglobin 04/07/2022 14.6  13.0 - 17.0 g/dL Final  ? HCT 17/49/4496 43.2  39.0 - 52.0 % Final  ? MCV 04/07/2022 93.1  80.0 - 100.0 fL Final  ? MCH 04/07/2022 31.5  26.0 - 34.0 pg Final  ? MCHC 04/07/2022 33.8  30.0 - 36.0 g/dL Final  ? RDW 75/91/6384 12.7  11.5 - 15.5 % Final  ? Platelets 04/07/2022 188  150 - 400 K/uL Final  ? nRBC 04/07/2022 0.0  0.0 - 0.2 % Final  ? Performed at [facility], [address] [place], [place] [address]  ? Hgb A1c MFr Bld 04/07/2022 6.8 (H)  4.8 - 5.6 % Final  ? Comment: (NOTE) ?Pre diabetes:          5.7%-6.4% ? ?Diabetes:              >6.4% ? ?Glycemic control for   <7.0% ?adults with diabetes ?  ? Mean Plasma Glucose 04/07/2022 148.46  mg/dL Final  ? Performed at [facility], [address], [place], [place] [address]  ? MRSA, PCR 04/07/2022 NEGATIVE  NEGATIVE Final  ? Staphylococcus aureus 04/07/2022 NEGATIVE  NEGATIVE Final  ? Comment: (NOTE) ?The Xpert SA Assay (FDA approved for NASAL specimens in patients 59 ?years of age and older), is one component of a comprehensive ?surveillance program. It is not intended to diagnose infection nor to ?guide or monitor treatment. ?Performed at [facility], [address] ?[place], [place] [address] ?  ? Glucose-Capillary 04/07/2022 96  70 - 99 mg/dL Final  ? Glucose reference range applies only to samples taken after fasting for at least 8 hours.  ?  ? ?X-Rays:DG Knee 1-2 Views Right ? ?Result Date: 04/13/2022 ?CLINICAL DATA:  Postoperative evaluation. EXAM: RIGHT KNEE - 1-2 VIEW COMPARISON:  Apr 22, 2021 FINDINGS: No evidence of an acute fracture or dislocation. A right knee replacement is seen without  evidence of surrounding lucency to suggest the presence of hardware loosening. There is a moderate to large joint effusion with a moderate amount of associated joint space air. Subsequent anterior soft tissue swelli

## 2022-04-15 NOTE — Plan of Care (Signed)
?  Problem: Education: ?Goal: Knowledge of the prescribed therapeutic regimen will improve ?Outcome: Progressing ?Goal: Individualized Educational Video(s) ?Outcome: Progressing ?  ?Problem: Activity: ?Goal: Ability to avoid complications of mobility impairment will improve ?Outcome: Progressing ?Goal: Range of joint motion will improve ?Outcome: Progressing ?  ?Problem: Clinical Measurements: ?Goal: Postoperative complications will be avoided or minimized ?Outcome: Progressing ?  ?Problem: Pain Management: ?Goal: Pain level will decrease with appropriate interventions ?Outcome: Progressing ?  ?Problem: Skin Integrity: ?Goal: Will show signs of wound healing ?Outcome: Progressing ?  ?Problem: Education: ?Goal: Knowledge of the prescribed therapeutic regimen will improve ?Outcome: Progressing ?Goal: Individualized Educational Video(s) ?Outcome: Progressing ?  ?Problem: Activity: ?Goal: Ability to avoid complications of mobility impairment will improve ?Outcome: Progressing ?Goal: Range of joint motion will improve ?Outcome: Progressing ?  ?Problem: Clinical Measurements: ?Goal: Postoperative complications will be avoided or minimized ?Outcome: Progressing ?  ?Problem: Pain Management: ?Goal: Pain level will decrease with appropriate interventions ?Outcome: Progressing ?  ?Problem: Skin Integrity: ?Goal: Will show signs of wound healing ?Outcome: Progressing ?  ?Problem: Education: ?Goal: Knowledge of General Education information will improve ?Description: Including pain rating scale, medication(s)/side effects and non-pharmacologic comfort measures ?Outcome: Progressing ?  ?Problem: Health Behavior/Discharge Planning: ?Goal: Ability to manage health-related needs will improve ?Outcome: Progressing ?  ?Problem: Clinical Measurements: ?Goal: Ability to maintain clinical measurements within normal limits will improve ?Outcome: Progressing ?Goal: Will remain free from infection ?Outcome: Progressing ?Goal: Diagnostic  test results will improve ?Outcome: Progressing ?Goal: Respiratory complications will improve ?Outcome: Progressing ?Goal: Cardiovascular complication will be avoided ?Outcome: Progressing ?  ?Problem: Activity: ?Goal: Risk for activity intolerance will decrease ?Outcome: Progressing ?  ?Problem: Nutrition: ?Goal: Adequate nutrition will be maintained ?Outcome: Progressing ?  ?Problem: Coping: ?Goal: Level of anxiety will decrease ?Outcome: Progressing ?  ?Problem: Elimination: ?Goal: Will not experience complications related to bowel motility ?Outcome: Progressing ?Goal: Will not experience complications related to urinary retention ?Outcome: Progressing ?  ?Problem: Pain Managment: ?Goal: General experience of comfort will improve ?Outcome: Progressing ?  ?Problem: Safety: ?Goal: Ability to remain free from injury will improve ?Outcome: Progressing ?  ?Problem: Skin Integrity: ?Goal: Risk for impaired skin integrity will decrease ?Outcome: Progressing ?  ?

## 2022-04-15 NOTE — Progress Notes (Signed)
Subjective: ?2 Days Post-Op Procedure(s) (LRB): ?TOTAL KNEE ARTHROPLASTY (Right) ?KNEE INJECTION (Left) ?Patient reports pain as mild and moderate.   ?Incisional and tourniquet pain. No other c/o. ?Feels ready to go home ? ?Objective: ?Vital signs in last 24 hours: ?Temp:  [97.7 ?F (36.5 ?C)-98.3 ?F (36.8 ?C)] 97.7 ?F (36.5 ?C) (05/03 [id number]) ?Pulse Rate:  [69-104] 104 (05/03 0655) ?Resp:  [18-20] 18 (05/03 0655) ?BP: (136-156)/(76-99) 136/90 (05/03 0655) ?SpO2:  [89 %-95 %] 89 % (05/03 0655) ? ?Intake/Output from previous day: ?05/02 0701 - 05/03 0700 ?In: 1200 [P.O.:1200] ?Out: -  ?Intake/Output this shift: ?Total I/O ?In: 240 [P.O.:240] ?Out: -  ? ?Recent Labs  ?  04/14/22 ?0350 04/15/22 ?0616  ?HGB 14.2 13.5  ? ?Recent Labs  ?  04/14/22 ?0350 04/15/22 ?0616  ?WBC 11.4* 13.0*  ?RBC 4.61 4.35  ?HCT 42.6 40.3  ?PLT 154 182  ? ?Recent Labs  ?  04/14/22 ?0350 04/15/22 ?0616  ?NA 132* 135  ?K 5.2* 4.1  ?CL 98 100  ?CO2 25 28  ?BUN 22* 31*  ?CREATININE 1.68* 1.59*  ?GLUCOSE 335* 181*  ?CALCIUM 8.2* 8.7*  ? ?No results for input(s): LABPT, INR in the last 72 hours. ? ?Neurologically intact ?ABD soft ?Neurovascular intact ?Sensation intact distally ?Intact pulses distally ?Dorsiflexion/Plantar flexion intact ?Incision: dressing C/D/I and no drainage ?No cellulitis present ?Compartment soft ?No sign of DVT ? ? ?Assessment/Plan: ?2 Days Post-Op Procedure(s) (LRB): ?TOTAL KNEE ARTHROPLASTY (Right) ?KNEE INJECTION (Left) ?Advance diet ?Up with therapy ?D/C IV fluids ?Plan D/C today - home, outpt PT scheduled ? ? ?Cecilie Kicks ?04/15/2022, 8:49 AM ? ?

## 2022-04-15 NOTE — Progress Notes (Addendum)
Physical Therapy Treatment ?Patient Details ?Name: Elijah Watkins ?MRN: [id number] ?DOB: [birth year] ?Today's Date: 04/15/2022 ? ? ?History of Present Illness Patient is 56 y.o. male s/p Rt TKA on 04/13/22 with PMH significant for OA, CAD, DM, GERD, hx of DVT, HTN, HLD, MI. ? ?  ?PT Comments  ? ? Patient remains limited by pain and reports pain a 7-8/10 after medication. Pt also slightly drowsy this session. Min assist required for transfer and bed elevated with cues required for safe hand placement and assist to steady with rise. Pt ambulated ~25' with RW and min assist required with cues for safe step pattern and safe management of walker. He required rest multiple times during gait and leaned on walker required reminders for safety. Stair training deferred due to pain and fatigue. Pt ended session in recliner and instructed to complete ankle pumps to facilitate circulation. Will continue to progress mobility tomorrow as pain and mobility improves. ? ?  ?Recommendations for follow up therapy are one component of a multi-disciplinary discharge planning process, led by the attending physician.  Recommendations may be updated based on patient status, additional functional criteria and insurance authorization. ? ?Follow Up Recommendations ? Follow physician's recommendations for discharge plan and follow up therapies ?  ?  ?Assistance Recommended at Discharge Intermittent Supervision/Assistance  ?Patient can return home with the following A little help with walking and/or transfers;A little help with bathing/dressing/bathroom;Assistance with cooking/housework;Direct supervision/assist for medications management;Assist for transportation;Help with stairs or ramp for entrance ?  ?Equipment Recommendations ? Rolling walker (2 wheels)  ?  ?Recommendations for Other Services   ? ? ?  ?Precautions / Restrictions Precautions ?Precautions: Fall ?Precaution Comments: 2 falls in last 6 months, knee gave out while  walking ?Restrictions ?Weight Bearing Restrictions: No ?Other Position/Activity Restrictions: WBAT  ?  ? ?Mobility ? Bed Mobility ?Overal bed mobility: Needs Assistance ?Bed Mobility: Supine to Sit ?  ?  ?Supine to sit: Supervision, HOB elevated ?  ?  ?General bed mobility comments: pt using belt to assist Rt LE off EOB, supervision for safety ?  ? ?Transfers ?Overall transfer level: Needs assistance ?Equipment used: Rolling walker (2 wheels) ?Transfers: Sit to/from Stand ?Sit to Stand: Min assist, From elevated surface ?  ?  ?  ?  ?  ?General transfer comment: pt unable to stand on first attempt due to weakness and assist to steady. patient required EOB elevated and assist to steady with rise. ?  ? ?Ambulation/Gait ?Ambulation/Gait assistance: Min guard, Min assist ?Gait Distance (Feet): 25 Feet ?Assistive device: Rolling walker (2 wheels) ?Gait Pattern/deviations: Step-to pattern, Decreased stride length, Decreased stance time - right, Decreased weight shift to right, Shuffle, Wide base of support ?Gait velocity: decr ?  ?  ?General Gait Details: pt continues to require cues to maintain safe proximity to RW and assist to prevent LOB. pt frequently flexing trunk forward and resting on walker. ? ? ?Stairs ?  ?  ?  ?  ?  ? ? ?Wheelchair Mobility ?  ? ?Modified Rankin (Stroke Patients Only) ?  ? ? ?  ?Balance Overall balance assessment: Needs assistance ?Sitting-balance support: Feet supported ?Sitting balance-Leahy Scale: Good ?  ?  ?Standing balance support: During functional activity, Bilateral upper extremity supported, Reliant on assistive device for balance ?Standing balance-Leahy Scale: Fair ?  ?  ?  ?  ?  ?  ?  ?  ?  ?  ?  ?  ?  ? ?  ?Cognition Arousal/Alertness: Awake/alert ?Behavior During  Therapy: WFL for tasks assessed/performed ?Overall Cognitive Status: Within Functional Limits for tasks assessed ?  ?  ?  ?  ?  ?  ?  ?  ?  ?  ?  ?  ?  ?  ?  ?  ?  ?  ?  ? ?  ?Exercises Total Joint Exercises ?Ankle  Circles/Pumps: AROM, Both, 10 reps ?Quad Sets: AROM, Right, 5 reps ?Heel Slides: AROM, 5 reps, Right ?Hip ABduction/ADduction: AROM, Right, 5 reps ? ?  ?General Comments   ?  ?  ? ?Pertinent Vitals/Pain Pain Assessment ?Pain Assessment: 0-10 ?Pain Score: 7  ?Faces Pain Scale: Hurts whole lot ?Pain Location: Rt knee ?Pain Descriptors / Indicators: Aching, Dull, Discomfort ?Pain Intervention(s): Limited activity within patient's tolerance, Monitored during session, Repositioned, Ice applied, Premedicated before session  ? ? ?Home Living   ?  ?  ?  ?  ?  ?  ?  ?  ?  ?   ?  ?Prior Function    ?  ?  ?   ? ?PT Goals (current goals can now be found in the care plan section) Acute Rehab PT Goals ?Patient Stated Goal: get back to gym and indepeendence ?PT Goal Formulation: With patient ?Time For Goal Achievement: 04/21/22 ?Potential to Achieve Goals: Good ?Progress towards PT goals: Progressing toward goals ? ?  ?Frequency ? ? ? 7X/week ? ? ? ?  ?PT Plan Current plan remains appropriate  ? ? ?Co-evaluation   ?  ?  ?  ?  ? ?  ?AM-PAC PT "6 Clicks" Mobility   ?Outcome Measure ? Help needed turning from your back to your side while in a flat bed without using bedrails?: A Little ?Help needed moving from lying on your back to sitting on the side of a flat bed without using bedrails?: A Little ?Help needed moving to and from a bed to a chair (including a wheelchair)?: A Little ?Help needed standing up from a chair using your arms (e.g., wheelchair or bedside chair)?: A Little ?Help needed to walk in hospital room?: A Little ?Help needed climbing 3-5 steps with a railing? : A Little ?6 Click Score: 18 ? ?  ?End of Session Equipment Utilized During Treatment: Gait belt ?Activity Tolerance: Patient tolerated treatment well;Patient limited by pain ?Patient left: in chair;with call bell/phone within reach;with chair alarm set;with family/visitor present ?Nurse Communication: Mobility status ?PT Visit Diagnosis: Other abnormalities of  gait and mobility (R26.89);Muscle weakness (generalized) (M62.81);Difficulty in walking, not elsewhere classified (R26.2) ?  ? ? ?Time: 5003-7048 ?PT Time Calculation (min) (ACUTE ONLY): 22 min ? ?Charges:  $Gait Training: 8-22 mins          ?          ? ?Renaldo Fiddler PT, DPT ?Acute Rehabilitation Services ?Office [phone] ?Pager [phone]  ? ? ?[name] ?04/15/2022, 3:51 PM ? ?

## 2022-04-15 NOTE — Progress Notes (Signed)
?PROGRESS NOTE ? ? ? ?Elijah Watkins  [id number] DOB: 06-Aug-1966 DOA: 04/13/2022 ?PCP: Earnie Larsson, PA-C  ? ? ?Brief Narrative:  ?Elijah Watkins  is a 56 y.o. male with past medical history of diabetes mellitus type 2, coronary artery disease status post stent, history of DVT, hypertension, hyperlipidemia.  Seen to the hospital for right total knee arthroplasty and also received steroid injection on the left knee.  He was noted to have elevated blood glucose level of 335 so hospitalist team was consulted for management of diabetes.  At home patient takes Novolin 70/30 70 units subcu twice a day.  Patient did not take his insulin on the day of surgery.  His last hemoglobin A1c was 6.8.  Patient follows up with his primary care physician for his diabetes  ?  ?Assessment and Plan: ? ?Diabetes mellitus type 2 with hyperglycemia.  Improved, likely secondary to missing of insulin doses and steroid injection.  Patient takes NovoLog 70/30 6 twice a day at home.  Patient was counseled need for being compliant with his dietary regimen.  Currently on sliding scale and mealtime insulin as well.  Latest POC glucose of 182.  Okay to resume back on his insulin regimen from home on discharge. ? ?Acute kidney injury on CKD stage IIIb- ?Latest creatinine of 1.5.  History of diastolic heart failure.  Was briefly on IV fluids.  Monitor BMP as outpatient ? ?CAD-stable.  Consider restarting Plavix at discharge. ? ?Essential hypertension-blood pressure is stable at this time.  on hydralazine 25 mg p.o. every 6 hours as needed for BP greater than 160/100.  Okay to resume lisinopril from home on discharge. ? ?Mild hyperkalemia-received 1 dose of Lokelma.  Potassium today at 4.1. ? ? DVT prophylaxis: SCDs Start: 04/13/22 1810 ?Place TED hose Start: 04/13/22 1810 ? ? ?Code Status:   ?  Code Status: Full Code ? ?Disposition: As per primary team.  Patient is medically stable for disposition. ? ? Family Communication: Spoke with the  patient at bedside. ? ?Procedures:  ?Right total knee arthroplasty ? ?Antimicrobials:  ?None ? ?Anti-infectives (From admission, onward)  ? ? Start     Dose/Rate Route Frequency Ordered Stop  ? 04/13/22 1945  ceFAZolin (ANCEF) IVPB 2g/100 mL premix       ? 2 g ?200 mL/hr over 30 Minutes Intravenous Every 6 hours 04/13/22 1810 04/14/22 0243  ? 04/13/22 1100  ceFAZolin (ANCEF) IVPB 3g/100 mL premix       ? 3 g ?200 mL/hr over 30 Minutes Intravenous On call to O.R. 04/13/22 1056 04/13/22 1354  ? ?  ? ? ? ?Subjective: ?Today, patient was seen and examined at bedside.  Denies nausea vomiting fever chills or rigor.  Complains of mild knee pain. ? ?Objective: ?Vitals:  ? 04/14/22 1836 04/14/22 2232 04/15/22 [id number] 04/15/22 [id number]  ?BP: 137/76 140/86 (!) 156/99 136/90  ?Pulse: 70 70 72 (!) 104  ?Resp: 20 18 18 18   ?Temp: 98.2 ?F (36.8 ?C) 98.2 ?F (36.8 ?C) 98.3 ?F (36.8 ?C) 97.7 ?F (36.5 ?C)  ?TempSrc:      ?SpO2: 95% 92% 93% (!) 89%  ?Weight:      ?Height:      ? ? ?Intake/Output Summary (Last 24 hours) at 04/15/2022 0940 ?Last data filed at 04/15/2022 0847 ?Gross per 24 hour  ?Intake 1200 ml  ?Output --  ?Net 1200 ml  ? ?Filed Weights  ? 04/14/22 0427  ?Weight: (!) 137.2 kg  ? ? ?Physical Examination: ?Body  mass index is 42.19 kg/m?.  ? ?General: Morbidly obese built, not in obvious distress ?HENT:   No scleral pallor or icterus noted. Oral mucosa is moist.  ?Chest:  Clear breath sounds.  Diminished breath sounds bilaterally. No crackles or wheezes.  ?CVS: S1 &S2 heard. No murmur.  Regular rate and rhythm. ?Abdomen: Soft, nontender, nondistended.  Bowel sounds are heard.   ?Extremities: No cyanosis, clubbing or edema.  Peripheral pulses are palpable.  Right knee surgery site clean dry and intact. ?Psych: Alert, awake and oriented, normal mood ?CNS:  No cranial nerve deficits.  Power equal in all extremities.   ?Skin: Warm and dry.  No rashes noted. ? ?Data Reviewed:  ? ?CBC: ?Recent Labs  ?Lab 04/14/22 ?0350 04/15/22 ?0616  ?WBC  11.4* 13.0*  ?HGB 14.2 13.5  ?HCT 42.6 40.3  ?MCV 92.4 92.6  ?PLT 154 182  ? ? ?Basic Metabolic Panel: ?Recent Labs  ?Lab 04/14/22 ?0350 04/15/22 ?0616  ?NA 132* 135  ?K 5.2* 4.1  ?CL 98 100  ?CO2 25 28  ?GLUCOSE 335* 181*  ?BUN 22* 31*  ?CREATININE 1.68* 1.59*  ?CALCIUM 8.2* 8.7*  ? ? ?Liver Function Tests: ?No results for input(s): AST, ALT, ALKPHOS, BILITOT, PROT, ALBUMIN in the last 168 hours. ? ? ?Radiology Studies: ?DG Knee 1-2 Views Right ? ?Result Date: 04/13/2022 ?CLINICAL DATA:  Postoperative evaluation. EXAM: RIGHT KNEE - 1-2 VIEW COMPARISON:  Apr 22, 2021 FINDINGS: No evidence of an acute fracture or dislocation. A right knee replacement is seen without evidence of surrounding lucency to suggest the presence of hardware loosening. There is a moderate to large joint effusion with a moderate amount of associated joint space air. Subsequent anterior soft tissue swelling is noted with multiple radiopaque skin staples. IMPRESSION: 1. Right knee replacement without evidence of hardware complication. 2. Moderate to large joint effusion with a moderate amount of associated joint space air. Electronically Signed   By: Virgina Norfolk M.D.   On: 04/13/2022 17:06   ? ? ? LOS: 0 days  ? ? ?Flora Lipps, MD ?[facility] ?Available via Epic secure chat 7am-7pm ?After these hours, please refer to coverage provider listed on [email or web address] ?04/15/2022, 9:40 AM  ?  ?

## 2022-04-15 NOTE — Progress Notes (Signed)
Physical Therapy Treatment ?Patient Details ?Name: Elijah Watkins ?MRN: [id number] ?DOB: July 08, 1966 ?Today's Date: 04/15/2022 ? ? ?History of Present Illness Patient is 56 y.o. male s/p Rt TKA on 04/13/22 with PMH significant for OA, CAD, DM, GERD, hx of DVT, HTN, HLD, MI. ? ?  ?PT Comments  ? ? Patient limited this AM session due to pain. He was able to use belt to assist Rt LE out of bed and Min guard with cues for technique to rise and take steps with RW. Pt took several small steps before requiring seated rest in recliner due to pain. RN provided medicine and reviewed HEP with pt for ROM. Will continue to progress pt as able in preparation for safe discharge home. ? ?   ?Recommendations for follow up therapy are one component of a multi-disciplinary discharge planning process, led by the attending physician.  Recommendations may be updated based on patient status, additional functional criteria and insurance authorization. ? ?Follow Up Recommendations ? Follow physician's recommendations for discharge plan and follow up therapies ?  ?  ?Assistance Recommended at Discharge Intermittent Supervision/Assistance  ?Patient can return home with the following A little help with walking and/or transfers;A little help with bathing/dressing/bathroom;Assistance with cooking/housework;Direct supervision/assist for medications management;Assist for transportation;Help with stairs or ramp for entrance ?  ?Equipment Recommendations ? Rolling walker (2 wheels)  ?  ?Recommendations for Other Services   ? ? ?  ?Precautions / Restrictions Precautions ?Precautions: Fall ?Precaution Comments: 2 falls in last 6 months, knee gave out while walking ?Restrictions ?Weight Bearing Restrictions: No ?Other Position/Activity Restrictions: WBAT  ?  ? ?Mobility ? Bed Mobility ?Overal bed mobility: Needs Assistance ?Bed Mobility: Supine to Sit ?  ?  ?Supine to sit: Min guard ?  ?  ?General bed mobility comments: cues to use belt to assist Rt LE  off EOB, guarding and pt taking extra time ?  ? ?Transfers ?Overall transfer level: Needs assistance ?Equipment used: Rolling walker (2 wheels) ?Transfers: Sit to/from Stand ?Sit to Stand: Min guard ?  ?  ?  ?  ?  ?General transfer comment: pt required 2 attempts to power up and extra effort with single UE from EOB. ?  ? ?Ambulation/Gait ?Ambulation/Gait assistance: Min guard ?Gait Distance (Feet): 6 Feet ?Assistive device: Rolling walker (2 wheels) ?Gait Pattern/deviations: Step-to pattern, Decreased stride length, Decreased stance time - right, Decreased weight shift to right, Shuffle, Wide base of support ?Gait velocity: decr ?  ?  ?General Gait Details: cues to maintain safe position to walker, pt took several small steps forward and then turned to sit in recliner due to pain. no buckling or LOB noted. ? ? ?Stairs ?  ?  ?  ?  ?  ? ? ?Wheelchair Mobility ?  ? ?Modified Rankin (Stroke Patients Only) ?  ? ? ?  ?Balance Overall balance assessment: Needs assistance ?Sitting-balance support: Feet supported ?Sitting balance-Leahy Scale: Good ?  ?  ?Standing balance support: During functional activity, Bilateral upper extremity supported, Reliant on assistive device for balance ?Standing balance-Leahy Scale: Fair ?  ?  ?  ?  ?  ?  ?  ?  ?  ?  ?  ?  ?  ? ?  ?Cognition Arousal/Alertness: Awake/alert ?Behavior During Therapy: [facility] for tasks assessed/performed ?Overall Cognitive Status: Within Functional Limits for tasks assessed ?  ?  ?  ?  ?  ?  ?  ?  ?  ?  ?  ?  ?  ?  ?  ?  ?  ?  ?  ? ?  ?  Exercises Total Joint Exercises ?Ankle Circles/Pumps: AROM, Both, 10 reps ?Quad Sets: AROM, Right, 5 reps ?Heel Slides: AROM, 5 reps, Right ?Hip ABduction/ADduction: AROM, Right, 5 reps ? ?  ?General Comments   ?  ?  ? ?Pertinent Vitals/Pain Pain Assessment ?Pain Assessment: Faces ?Faces Pain Scale: Hurts whole lot ?Pain Location: Rt knee ?Pain Descriptors / Indicators: Aching, Dull, Discomfort ?Pain Intervention(s): Limited activity  within patient's tolerance, Monitored during session, Repositioned, Ice applied, RN gave pain meds during session  ? ? ?Home Living   ?  ?  ?  ?  ?  ?  ?  ?  ?  ?   ?  ?Prior Function    ?  ?  ?   ? ?PT Goals (current goals can now be found in the care plan section) Acute Rehab PT Goals ?Patient Stated Goal: get back to gym and indepeendence ?PT Goal Formulation: With patient ?Time For Goal Achievement: 04/21/22 ?Potential to Achieve Goals: Good ?Progress towards PT goals: Progressing toward goals ? ?  ?Frequency ? ? ? 7X/week ? ? ? ?  ?PT Plan Current plan remains appropriate  ? ? ?Co-evaluation   ?  ?  ?  ?  ? ?  ?AM-PAC PT "6 Clicks" Mobility   ?Outcome Measure ? Help needed turning from your back to your side while in a flat bed without using bedrails?: A Little ?Help needed moving from lying on your back to sitting on the side of a flat bed without using bedrails?: A Little ?Help needed moving to and from a bed to a chair (including a wheelchair)?: A Little ?Help needed standing up from a chair using your arms (e.g., wheelchair or bedside chair)?: A Little ?Help needed to walk in hospital room?: A Little ?Help needed climbing 3-5 steps with a railing? : A Little ?6 Click Score: 18 ? ?  ?End of Session Equipment Utilized During Treatment: Gait belt ?Activity Tolerance: Patient tolerated treatment well;Patient limited by pain ?Patient left: in chair;with call bell/phone within reach;with chair alarm set;with family/visitor present ?Nurse Communication: Mobility status ?PT Visit Diagnosis: Other abnormalities of gait and mobility (R26.89);Muscle weakness (generalized) (M62.81);Difficulty in walking, not elsewhere classified (R26.2) ?  ? ? ?Time: [phone] ?PT Time Calculation (min) (ACUTE ONLY): 22 min ? ?Charges:  $Therapeutic Exercise: 8-22 mins          ?          ? ?Elijah Watkins PT, DPT ?Acute Rehabilitation Services ?Office [phone] ?Pager [phone]  ? ? ?Elijah Watkins ?04/15/2022, 12:33 PM ? ?

## 2022-04-16 DIAGNOSIS — M1711 Unilateral primary osteoarthritis, right knee: Secondary | ICD-10-CM | POA: Diagnosis not present

## 2022-04-16 DIAGNOSIS — N1832 Chronic kidney disease, stage 3b: Secondary | ICD-10-CM | POA: Diagnosis not present

## 2022-04-16 DIAGNOSIS — E1165 Type 2 diabetes mellitus with hyperglycemia: Secondary | ICD-10-CM | POA: Diagnosis not present

## 2022-04-16 LAB — BASIC METABOLIC PANEL
Anion gap: 9 (ref 5–15)
BUN: 31 mg/dL — ABNORMAL HIGH (ref 6–20)
CO2: 29 mmol/L (ref 22–32)
Calcium: 8.7 mg/dL — ABNORMAL LOW (ref 8.9–10.3)
Chloride: 98 mmol/L (ref 98–111)
Creatinine, Ser: 1.75 mg/dL — ABNORMAL HIGH (ref 0.61–1.24)
GFR, Estimated: 45 mL/min — ABNORMAL LOW (ref >60)
Glucose, Bld: 122 mg/dL — ABNORMAL HIGH (ref 70–99)
Potassium: 4.6 mmol/L (ref 3.5–5.1)
Sodium: 136 mmol/L (ref 135–145)

## 2022-04-16 LAB — GLUCOSE, CAPILLARY
Glucose-Capillary: 127 mg/dL — ABNORMAL HIGH (ref 70–99)
Glucose-Capillary: 47 mg/dL — ABNORMAL LOW (ref 70–99)
Glucose-Capillary: 49 mg/dL — ABNORMAL LOW (ref 70–99)
Glucose-Capillary: 77 mg/dL (ref 70–99)

## 2022-04-16 NOTE — Progress Notes (Signed)
Physical Therapy Treatment ?Patient Details ?Name: Elijah Watkins ?MRN: [id number] ?DOB: Feb 08, 1966 ?Today's Date: 04/16/2022 ? ? ?History of Present Illness Patient is 56 y.o. male s/p Rt TKA on 04/13/22 with PMH significant for OA, CAD, DM, GERD, hx of DVT, HTN, HLD, MI. ? ?  ?PT Comments  ? ? POD # 3 ?Spouse present during session for Education.  Assisted OOB to amb in hallway and practice stairs went well.  General bed mobility comments: using his belt to self assist R LE with increased time.  General transfer comment: from elevated bed with increased time self able with much effort/forward lean/forward weight shift. General Gait Details: with Spouse "hands on" assisted pt tolerated a functional distance amb a total of 48 feet.  Did have pt wear KI for increased support due to BMI. General stair comments: with Spouse "hands on" assisted up/down 2 steps wearing KI and using one LEFT rail and one RIGHT crutch.  Performed well enough to D/C to home.  Spouse stated she will have "extra help". ?Reviewed HEP handout.  Called [employer] to deliver a set of crutches.  Instructed pt/spouse "one crutch for stairs only".  Addressed all mobility questions, discussed appropriate activity, educated on use of ICE.  Pt plans to D/C to home with assistance from Spouse.  Pt ready for D/C to home.  Reported to RN. ? ?  ?Recommendations for follow up therapy are one component of a multi-disciplinary discharge planning process, led by the attending physician.  Recommendations may be updated based on patient status, additional functional criteria and insurance authorization. ? ?Follow Up Recommendations ? Follow physician's recommendations for discharge plan and follow up therapies ?  ?  ?Assistance Recommended at Discharge Intermittent Supervision/Assistance  ?Patient can return home with the following A little help with walking and/or transfers;A little help with bathing/dressing/bathroom;Assistance with cooking/housework;Direct  supervision/assist for medications management;Assist for transportation;Help with stairs or ramp for entrance ?  ?Equipment Recommendations ? Rolling walker (2 wheels);Crutches  ?  ?Recommendations for Other Services   ? ? ?  ?Precautions / Restrictions Precautions ?Precautions: Fall ?Precaution Comments: instructed no pillow under knee ?Required Braces or Orthoses: Knee Immobilizer - Right ?Knee Immobilizer - Right: Other (comment) (STAIRS) ?Restrictions ?Weight Bearing Restrictions: No ?Other Position/Activity Restrictions: WBAT  ?  ? ?Mobility ? Bed Mobility ?Overal bed mobility: Needs Assistance ?Bed Mobility: Supine to Sit ?  ?  ?Supine to sit: Supervision, HOB elevated ?  ?  ?General bed mobility comments: using his belt to self assist R LE with increased time ?  ? ?Transfers ?Overall transfer level: Needs assistance ?Equipment used: Rolling walker (2 wheels) ?Transfers: Sit to/from Stand ?Sit to Stand: Supervision, Min guard ?  ?  ?  ?  ?  ?General transfer comment: from elevated bed with increased time self able with much effort/forward lean/forward weight shift. ?  ? ?Ambulation/Gait ?Ambulation/Gait assistance: Supervision, Min guard ?Gait Distance (Feet): 48 Feet ?Assistive device: Rolling walker (2 wheels) ?Gait Pattern/deviations: Step-to pattern, Decreased stride length, Decreased stance time - right, Decreased weight shift to right, Shuffle, Wide base of support ?Gait velocity: decreased ?  ?  ?General Gait Details: with Spouse "hands on" assisted pt tolerated a functional distance amb a total of 48 feet.  Did have pt wear KI for increased support due to BMI. ? ? ?Stairs ?Stairs: Yes ?Stairs assistance: Min guard, Min assist ?Stair Management: One rail Left, Step to pattern, Forwards, With crutches ?Number of Stairs: 4 ?General stair comments: with Spouse "hands  on" assisted up/down 2 steps wearing KI and using one LEFT rail and one RIGHT crutch.  Performed well enough to D/C to home.  Spouse stated  she will have "extra help". ? ? ?Wheelchair Mobility ?  ? ?Modified Rankin (Stroke Patients Only) ?  ? ? ?  ?Balance   ?  ?  ?  ?  ?  ?  ?  ?  ?  ?  ?  ?  ?  ?  ?  ?  ?  ?  ?  ? ?  ?Cognition Arousal/Alertness: Awake/alert ?Behavior During Therapy: [facility] for tasks assessed/performed ?Overall Cognitive Status: Within Functional Limits for tasks assessed ?  ?  ?  ?  ?  ?  ?  ?  ?  ?  ?  ?  ?  ?  ?  ?  ?General Comments: AxO x 3 very motivated and eager to go home today. ?  ?  ? ?  ?Exercises   ? ?  ?General Comments   ?  ?  ? ?Pertinent Vitals/Pain Pain Assessment ?Pain Assessment: 0-10 ?Pain Score: 7  ?Pain Location: Rt knee ?Pain Descriptors / Indicators: Aching, Tender, Tightness, Operative site guarding ?Pain Intervention(s): Monitored during session, Premedicated before session, Repositioned, Ice applied  ? ? ?Home Living   ?  ?  ?  ?  ?  ?  ?  ?  ?  ?   ?  ?Prior Function    ?  ?  ?   ? ?PT Goals (current goals can now be found in the care plan section) Progress towards PT goals: Progressing toward goals ? ?  ?Frequency ? ? ? 7X/week ? ? ? ?  ?PT Plan Current plan remains appropriate  ? ? ?Co-evaluation   ?  ?  ?  ?  ? ?  ?AM-PAC PT "6 Clicks" Mobility   ?Outcome Measure ? Help needed turning from your back to your side while in a flat bed without using bedrails?: A Little ?Help needed moving from lying on your back to sitting on the side of a flat bed without using bedrails?: A Little ?Help needed moving to and from a bed to a chair (including a wheelchair)?: A Little ?Help needed standing up from a chair using your arms (e.g., wheelchair or bedside chair)?: A Little ?Help needed to walk in hospital room?: A Little ?Help needed climbing 3-5 steps with a railing? : A Little ?6 Click Score: 18 ? ?  ?End of Session Equipment Utilized During Treatment: Gait belt ?Activity Tolerance: Patient tolerated treatment well ?Patient left: in chair;with call bell/phone within reach;with family/visitor present ?Nurse  Communication: Mobility status (pt ready for D/C to home) ?PT Visit Diagnosis: Other abnormalities of gait and mobility (R26.89);Muscle weakness (generalized) (M62.81);Difficulty in walking, not elsewhere classified (R26.2) ?  ? ? ?Time: [id number] ?PT Time Calculation (min) (ACUTE ONLY): 49 min ? ?Charges:  $Gait Training: 8-22 mins ?$Therapeutic Activity: 8-22 mins ?$Self Care/Home Management: 8-22          ?          ? ?Felecia Shelling  PTA ?Acute  Rehabilitation Services ?Pager      [phone] ?Office      [phone] ? ? ?

## 2022-04-16 NOTE — Progress Notes (Signed)
?PROGRESS NOTE ? ? ? ?Elijah Watkins  [id number] DOB: 02/06/1966 DOA: 04/13/2022 ?PCP: Earnie Larsson, PA-C  ? ? ?Brief Narrative:  ?Arrian Knoch  is a 56 y.o. male with past medical history of diabetes mellitus type 2, coronary artery disease status post stent, history of DVT, hypertension, hyperlipidemia.  Seen to the hospital for right total knee arthroplasty and also received steroid injection on the left knee.  He was noted to have elevated blood glucose level of 335 so hospitalist team was consulted for management of diabetes.  At home patient takes Novolin 70/30 70 units subcu twice a day.  Patient did not take his insulin on the day of surgery.  His last hemoglobin A1c was 6.8.  Patient follows up with his primary care physician for his diabetes  ?  ?Assessment and Plan: ? ?Diabetes mellitus type 2 with hyperglycemia.  Improved, likely secondary to missing of insulin doses and steroid injection.  Patient takes NovoLog 70/30, 6 units twice a day at home.    Currently on sliding scale and mealtime insulin as well.  Latest POC glucose of 127.  Okay to resume back on his insulin regimen from home on discharge. ? ?Acute kidney injury on CKD stage IIIb- ?Latest creatinine of 1.7.  History of diastolic heart failure.  Was briefly on IV fluids.  Monitor BMP as outpatient ? ?CAD-stable.  Consider restarting Plavix at discharge. ? ?Essential hypertension- ?blood pressure is stable at this time.  On as needed hydralazine.  Okay to resume lisinopril from home on discharge. ? ?Mild hyperkalemia-received 1 dose of Lokelma.  Potassium today at 4.6 ? ? DVT prophylaxis: SCDs Start: 04/13/22 1810 ?Place TED hose Start: 04/13/22 1810 ? ? ?Code Status:   ?  Code Status: Full Code ? ?Disposition: As per primary team.  Patient is medically stable for disposition. ? ? Family Communication: Spoke with the patient and patient's family at bedside. ? ?Procedures:  ?Right total knee arthroplasty ? ?Antimicrobials:   ?None ? ?Anti-infectives (From admission, onward)  ? ? Start     Dose/Rate Route Frequency Ordered Stop  ? 04/13/22 1945  ceFAZolin (ANCEF) IVPB 2g/100 mL premix       ? 2 g ?200 mL/hr over 30 Minutes Intravenous Every 6 hours 04/13/22 1810 04/14/22 0243  ? 04/13/22 1100  ceFAZolin (ANCEF) IVPB 3g/100 mL premix       ? 3 g ?200 mL/hr over 30 Minutes Intravenous On call to O.R. 04/13/22 1056 04/13/22 1354  ? ?  ? ?Subjective: ?Today, patient was seen and examined at bedside.  Patient working with physical therapy.  Family at bedside.  Complains of mild pain in the surgical area  ? ?Objective: ?Vitals:  ? 04/15/22 0655 04/15/22 1347 04/15/22 2125 04/16/22 0536  ?BP: 136/90 (!) 142/80 (!) 150/81 (!) 142/85  ?Pulse: (!) 104 87 91 76  ?Resp: 18 17 17 17   ?Temp: 97.7 ?F (36.5 ?C) 99.7 ?F (37.6 ?C) 98.8 ?F (37.1 ?C) 97.7 ?F (36.5 ?C)  ?TempSrc:  Oral Oral Oral  ?SpO2: (!) 89% 91% 96% 95%  ?Weight:      ?Height:      ? ? ?Intake/Output Summary (Last 24 hours) at 04/16/2022 1100 ?Last data filed at 04/16/2022 [id number] ?Gross per 24 hour  ?Intake 1320 ml  ?Output 175 ml  ?Net 1145 ml  ? ? ?Filed Weights  ? 04/14/22 0427  ?Weight: (!) 137.2 kg  ? ? ?Physical Examination: ?Body mass index is 42.19 kg/m?.  ? ?General:  Morbidly obese built, not in obvious distress ?Right knee surgical site clean dry and intact. ? ?Data Reviewed:  ? ?CBC: ?Recent Labs  ?Lab 04/14/22 ?0350 04/15/22 ?0616  ?WBC 11.4* 13.0*  ?HGB 14.2 13.5  ?HCT 42.6 40.3  ?MCV 92.4 92.6  ?PLT 154 182  ? ? ? ?Basic Metabolic Panel: ?Recent Labs  ?Lab 04/14/22 ?0350 04/15/22 ?0616 04/16/22 ?[id number]  ?NA 132* 135 136  ?K 5.2* 4.1 4.6  ?CL 98 100 98  ?CO2 25 28 29   ?GLUCOSE 335* 181* 122*  ?BUN 22* 31* 31*  ?CREATININE 1.68* 1.59* 1.75*  ?CALCIUM 8.2* 8.7* 8.7*  ? ? ? ?Liver Function Tests: ?No results for input(s): AST, ALT, ALKPHOS, BILITOT, PROT, ALBUMIN in the last 168 hours. ? ? ?Radiology Studies: ?No results found. ? ? ? LOS: 0 days  ? ? ?Flora Lipps, MD ?[facility] ?Available via Epic secure chat 7am-7pm ?After these hours, please refer to coverage provider listed on [email or web address] ?04/16/2022, 11:00 AM  ?  ?

## 2022-04-16 NOTE — Plan of Care (Signed)
  Problem: Pain Management: Goal: Pain level will decrease with appropriate interventions Outcome: Progressing   Problem: Pain Management: Goal: Pain level will decrease with appropriate interventions Outcome: Progressing   

## 2022-04-16 NOTE — Progress Notes (Signed)
Inpatient Diabetes Program Recommendations ? ?AACE/ADA: New Consensus Statement on Inpatient Glycemic Control (2015) ? ?Target Ranges:  Prepandial:   less than 140 mg/dL ?     Peak postprandial:   less than 180 mg/dL (1-2 hours) ?     Critically ill patients:  140 - 180 mg/dL  ? ?Lab Results  ?Component Value Date  ? GLUCAP 49 (L) 04/16/2022  ? HGBA1C 6.8 (H) 04/07/2022  ? ? ?Review of Glycemic Control ? ?Diabetes history: DM2 ?Outpatient Diabetes medications: Novolin 70/30 70 units BID ?Current orders for Inpatient glycemic control: Novolog 70/30 70 units BID, Novolog 0-15 units TID with meals and 0-5 HS ? ?Had hypos in 40's before lunch today ? ?Inpatient Diabetes Program Recommendations:   ? ?Decrease Novolog 70/30 to 40 units BID ? ?Continue to follow. ? ?Thank you. ?Ailene Ards, RD, LDN, CDE ?Inpatient Diabetes Coordinator ?[phone]  ? ? ? ? ?

## 2022-04-16 NOTE — Progress Notes (Signed)
Discharge package printed and instructions given to pt. Patient verbalizes understanding. 

## 2022-04-16 NOTE — Progress Notes (Signed)
Subjective: ?3 Days Post-Op Procedure(s) (LRB): ?TOTAL KNEE ARTHROPLASTY (Right) ?KNEE INJECTION (Left) ?Patient reports pain as moderate.   ? ?Objective: ?Vital signs in last 24 hours: ?Temp:  [97.7 ?F (36.5 ?C)-98.8 ?F (37.1 ?C)] 97.7 ?F (36.5 ?C) (05/04 0536) ?Pulse Rate:  [76-91] 76 (05/04 0536) ?Resp:  [17] 17 (05/04 0536) ?BP: (142-150)/(81-85) 142/85 (05/04 0536) ?SpO2:  [95 %-96 %] 95 % (05/04 0536) ? ?Intake/Output from previous day: ?05/03 0701 - 05/04 0700 ?In: 1320 [P.O.:1320] ?Out: -  ?Intake/Output this shift: ?Total I/O ?In: 240 [P.O.:240] ?Out: 175 [Urine:175] ? ?Recent Labs  ?  04/14/22 ?0350 04/15/22 ?0616  ?HGB 14.2 13.5  ? ?Recent Labs  ?  04/14/22 ?0350 04/15/22 ?0616  ?WBC 11.4* 13.0*  ?RBC 4.61 4.35  ?HCT 42.6 40.3  ?PLT 154 182  ? ?Recent Labs  ?  04/15/22 ?0616 04/16/22 ?7628  ?NA 135 136  ?K 4.1 4.6  ?CL 100 98  ?CO2 28 29  ?BUN 31* 31*  ?CREATININE 1.59* 1.75*  ?GLUCOSE 181* 122*  ?CALCIUM 8.7* 8.7*  ? ?No results for input(s): LABPT, INR in the last 72 hours. ? ?Neurologically intact ?ABD soft ?Neurovascular intact ?Sensation intact distally ?Intact pulses distally ?Dorsiflexion/Plantar flexion intact ?Incision: dressing C/D/I and no drainage ?No cellulitis present ?Compartment soft ?No calf pain or sign of DVT ? ? ?Assessment/Plan: ?3 Days Post-Op Procedure(s) (LRB): ?TOTAL KNEE ARTHROPLASTY (Right) ?KNEE INJECTION (Left) ?Advance diet ?Up with therapy ?D/C IV fluids ?D/C home today - outpatient PT arranged ?Seen by Dr Shelle Iron ? ?Elijah Spark PA-C ?04/16/2022, 1:50 PM ? ?

## 2022-04-16 NOTE — Progress Notes (Signed)
Orthopedic Tech Progress Note ?Patient Details:  ?Elijah Watkins ?1966/07/27 ?[id number] ? ?Patient ID: Elijah Watkins, male   DOB: [birth year], 56 y.o.   MRN: [id number] ? ?[name] ?04/16/2022, 11:20 AM ?Crutches delivered and sized per PT verbal . ?

## 2023-03-06 IMAGING — DX DG KNEE 1-2V*R*
2 series · 2 of 2 positions shown · non-contrast
Comparison: April 22, 2021

CLINICAL DATA: Postoperative evaluation.

EXAM:
RIGHT KNEE - 1-2 VIEW

[knee ap]
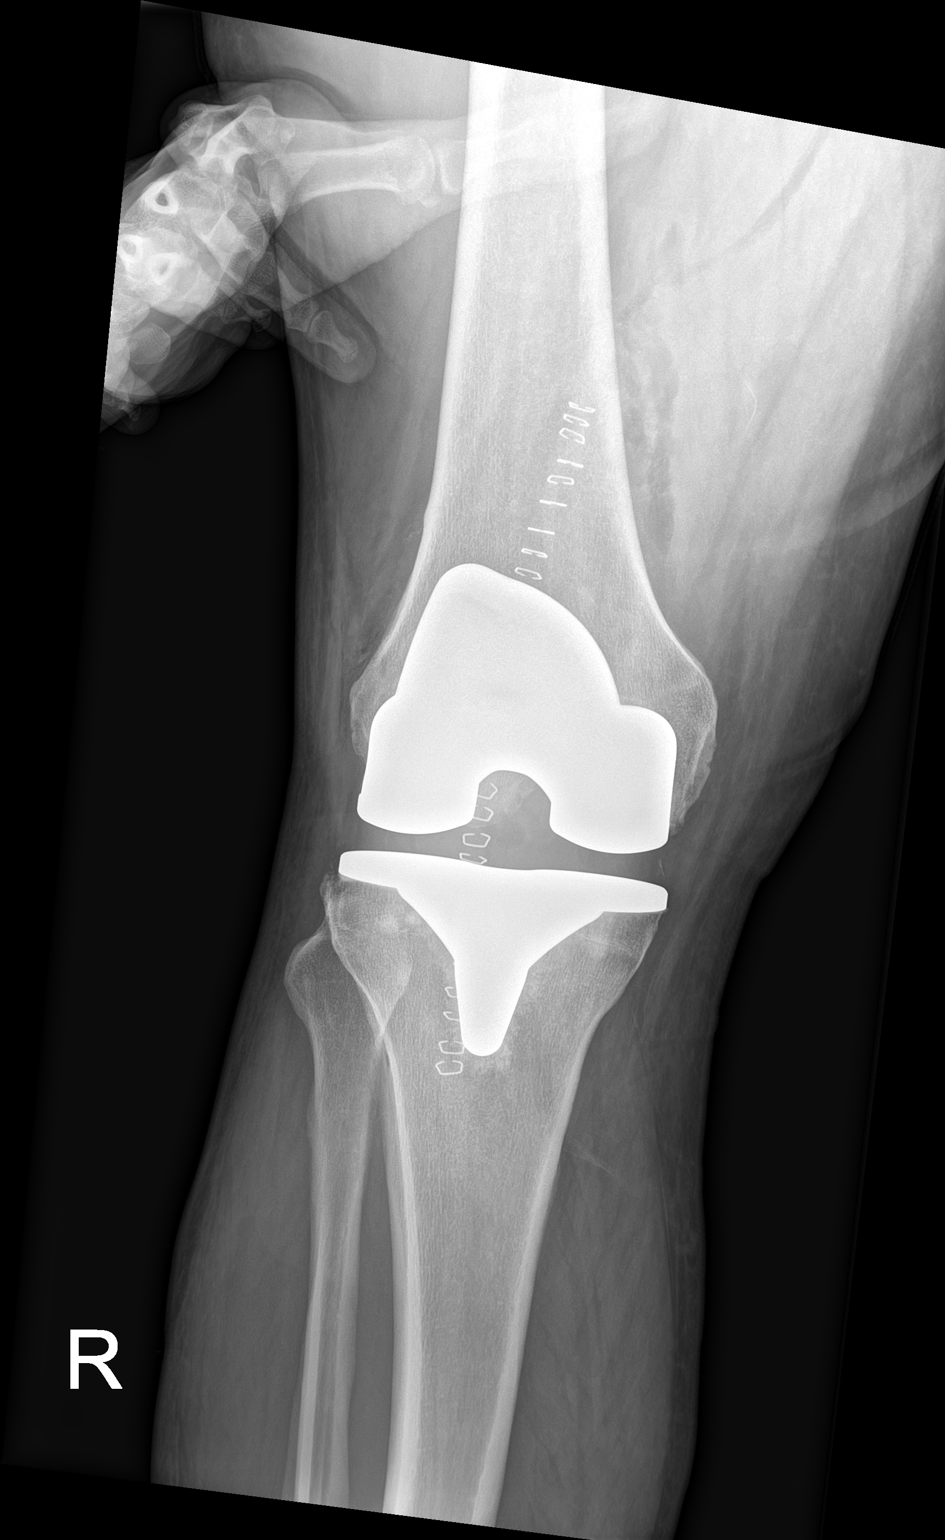

[knee lat]
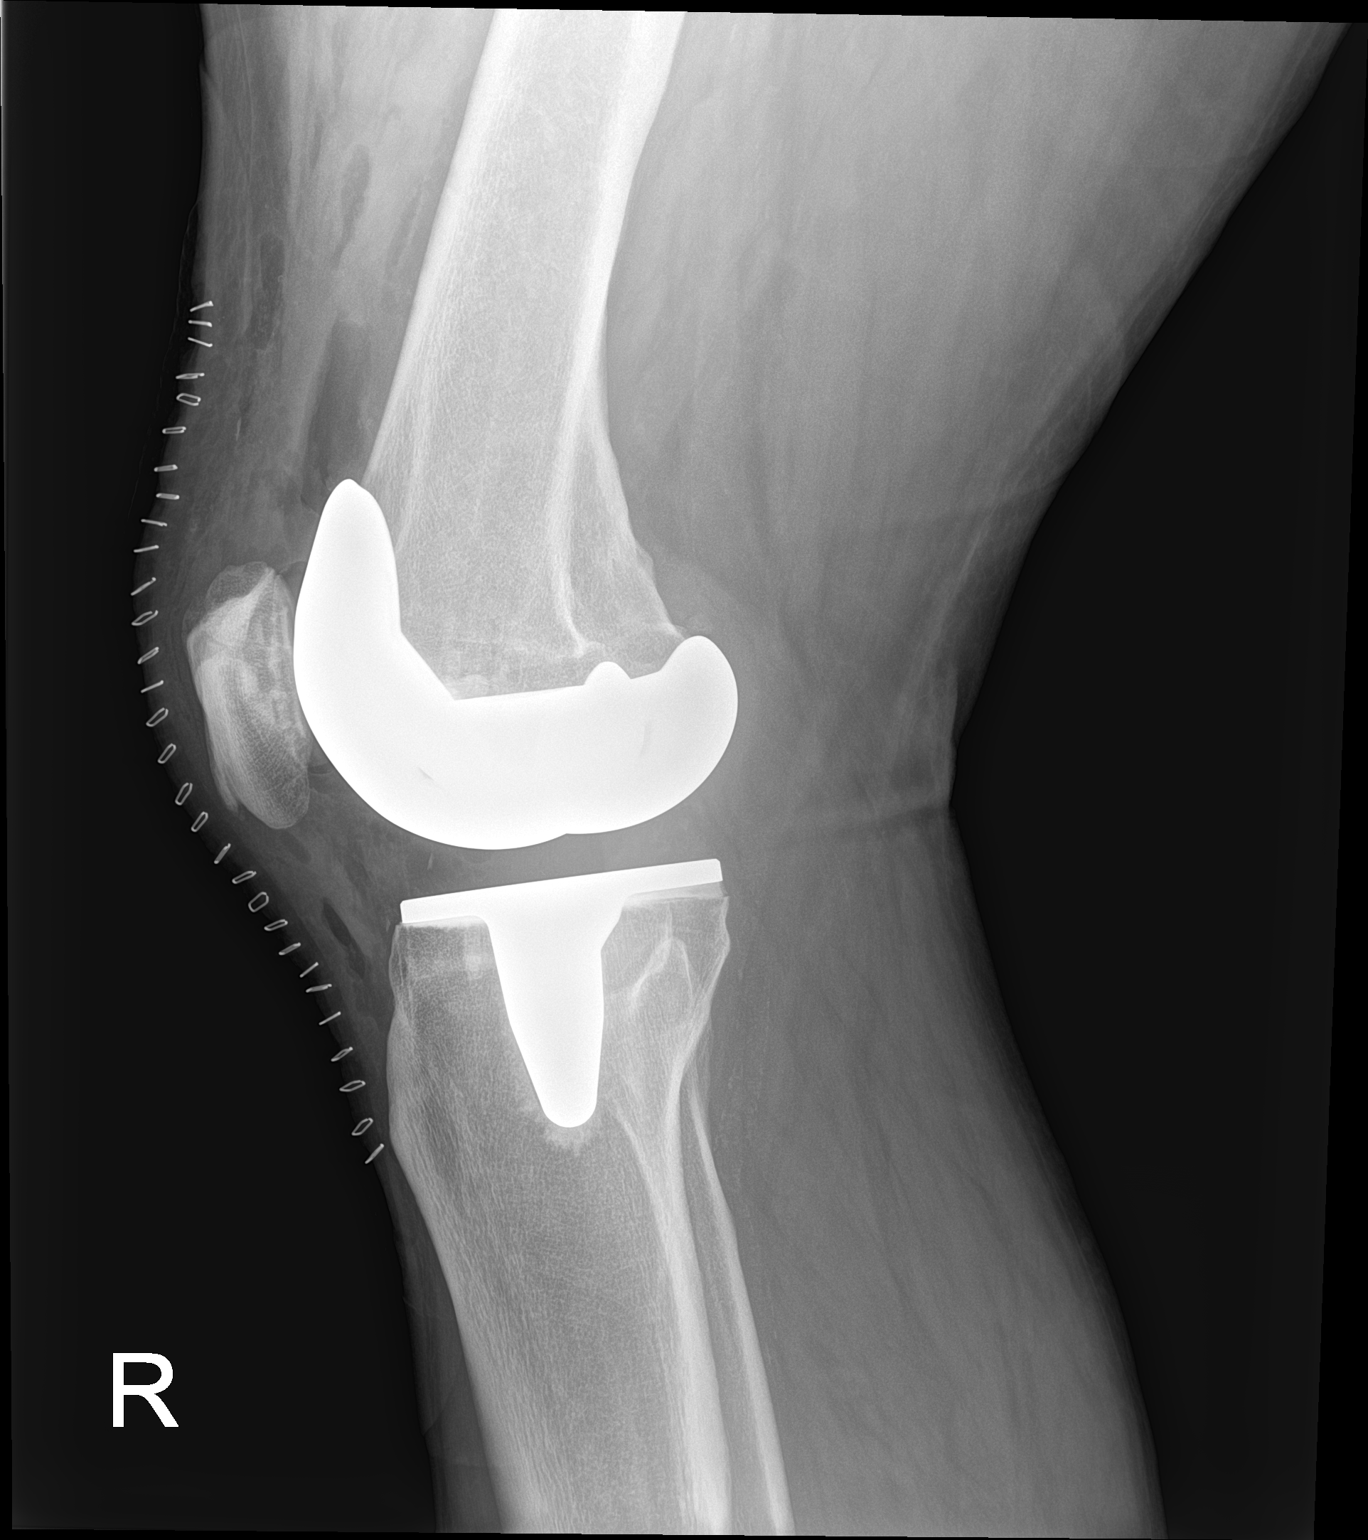

[2 of 2 positions shown; findings below may reference images not displayed]

FINDINGS: No evidence of an acute fracture or dislocation. A right knee
replacement is seen without evidence of surrounding lucency to
suggest the presence of hardware loosening. There is a moderate to
large joint effusion with a moderate amount of associated joint
space air. Subsequent anterior soft tissue swelling is noted with
multiple radiopaque skin staples.
IMPRESSION: 1. Right knee replacement without evidence of hardware complication.
2. Moderate to large joint effusion with a moderate amount of
associated joint space air.

## 2024-03-13 ENCOUNTER — Emergency Department (HOSPITAL_BASED_OUTPATIENT_CLINIC_OR_DEPARTMENT_OTHER): Payer: Self-pay | Admitting: Radiology

## 2024-03-13 ENCOUNTER — Other Ambulatory Visit (HOSPITAL_BASED_OUTPATIENT_CLINIC_OR_DEPARTMENT_OTHER): Payer: Self-pay

## 2024-03-13 ENCOUNTER — Emergency Department (HOSPITAL_BASED_OUTPATIENT_CLINIC_OR_DEPARTMENT_OTHER)
Admission: EM | Admit: 2024-03-13 | Discharge: 2024-03-13 | Disposition: A | Discharge status: Home / Self Care | Payer: Self-pay | Attending: Emergency Medicine | Admitting: Emergency Medicine

## 2024-03-13 ENCOUNTER — Encounter (HOSPITAL_BASED_OUTPATIENT_CLINIC_OR_DEPARTMENT_OTHER): Payer: Self-pay

## 2024-03-13 ENCOUNTER — Other Ambulatory Visit: Payer: Self-pay

## 2024-03-13 DIAGNOSIS — I2510 Atherosclerotic heart disease of native coronary artery without angina pectoris: Secondary | ICD-10-CM | POA: Insufficient documentation

## 2024-03-13 DIAGNOSIS — Z87442 Personal history of urinary calculi: Secondary | ICD-10-CM | POA: Insufficient documentation

## 2024-03-13 DIAGNOSIS — R051 Acute cough: Secondary | ICD-10-CM | POA: Insufficient documentation

## 2024-03-13 DIAGNOSIS — E119 Type 2 diabetes mellitus without complications: Secondary | ICD-10-CM | POA: Insufficient documentation

## 2024-03-13 LAB — RESP PANEL BY RT-PCR (RSV, FLU A&B, COVID)  RVPGX2
Influenza A by PCR: NEGATIVE
Influenza B by PCR: NEGATIVE
Resp Syncytial Virus by PCR: NEGATIVE
SARS Coronavirus 2 by RT PCR: NEGATIVE

## 2024-03-13 MED ORDER — BENZONATATE 100 MG PO CAPS
100.0000 mg | ORAL_CAPSULE | Freq: Three times a day (TID) | ORAL | 0 refills | Status: AC | PRN
  Filled 2024-03-13: qty 21, 7d supply, fill #0

## 2024-03-13 NOTE — ED Provider Notes (Signed)
 Emergency Department Provider Note   I have reviewed the triage vital signs and the nursing notes.   HISTORY  Chief Complaint URI   HPI Elijah Watkins is a 58 y.o. male presents emergency department with cough, congestion, fevers.  Symptoms have been moderate off over the past several days.  No chest pain or shortness of breath.  No known sick contacts.  Cough is mild and nonproductive.  Some pain in the right knee with baseline/chronic pain in the left.  No overlying rash, new injury, erythema to the skin.   Past Medical History:  Diagnosis Date   Anger    anger issues   Arthritis    CAD (coronary artery disease)    Diabetes mellitus    IDDM - fasting 70-120s   GERD (gastroesophageal reflux disease)    History of concussion    History of DVT (deep vein thrombosis) 03/2005   History of kidney stones    History of peptic ulcer age 24   Hyperlipidemia    Hypertension    under control, has been on med. > 7 yrs.   Myocardial infarction [facility])    stents age 53   Nephrolithiasis    III   Overweight(278.02)    Pilonidal cyst 05/2012   area is open, not draining   PONV (postoperative nausea and vomiting)    S/P coronary artery stent placement 2001, 05/2011   Seasonal allergies     Review of Systems  Constitutional: Positive fever/chills Cardiovascular: Denies chest pain. Respiratory: Denies shortness of breath. Positive cough.  Gastrointestinal: No abdominal pain.  No nausea, no vomiting.  Musculoskeletal: Negative for back pain. Skin: Negative for rash. Neurological: Negative for headaches.   ____________________________________________   PHYSICAL EXAM:  VITAL SIGNS: ED Triage Vitals  Encounter Vitals Group     BP 114/79     Pulse Rate 80     Resp 16     Temp 98.1 F (36.7 C)     Temp src      SpO2 99 %   Constitutional: Alert and oriented. Well appearing and in no acute distress. Eyes: Conjunctivae are normal.  Head: Atraumatic. Nose: No  congestion/rhinnorhea. Mouth/Throat: Mucous membranes are moist.  Neck: No stridor.   Cardiovascular: Normal rate, regular rhythm. Good peripheral circulation. Grossly normal heart sounds.   Respiratory: Normal respiratory effort.  No retractions. Lungs CTAB. Gastrointestinal: Soft and nontender. No distention.  Musculoskeletal: Left knee in brace. No joint effusion, erythema, or warmth to the bilateral knees.  Neurologic:  Normal speech and language.  Skin:  Skin is warm, dry and intact. No rash noted.   ____________________________________________   LABS (all labs ordered are listed, but only abnormal results are displayed)  Labs Reviewed  RESP PANEL BY RT-PCR (RSV, FLU A&B, COVID)  [id number]   ____________________________________________  RADIOLOGY  DG Chest 2 View Result Date: 03/13/2024 CLINICAL DATA:  Cough and fever. EXAM: CHEST - 2 VIEW COMPARISON:  07/03/2021 FINDINGS: The heart size and mediastinal contours are within normal limits. Both lungs are clear. The visualized skeletal structures are unremarkable. IMPRESSION: No active cardiopulmonary disease. Electronically Signed   By: Danae Orleans M.D.   On: 03/13/2024 13:32    ____________________________________________   PROCEDURES  Procedure(s) performed:   Procedures  None  ____________________________________________   INITIAL IMPRESSION / ASSESSMENT AND PLAN / ED COURSE  Pertinent labs & imaging results that were available during my care of the patient were reviewed by me and considered in my medical decision  making (see chart for details).   This patient is Presenting for Evaluation of Cough/fever, which does require a range of treatment options, and is a complaint that involves a moderate risk of morbidity and mortality.  The Differential Diagnoses include COVID, flu or RSV, CAP, CHF, PE, etc.  Clinical Laboratory Tests Ordered, included Viral panel negative.   Radiologic Tests Ordered, included CXR. I  independently interpreted the images and agree with radiology interpretation.   Cardiac Monitor Tracing which shows NSR.    Social Determinants of Health Risk patient is a non-smoker.   Medical Decision Making: Summary:  Patient presents the emergency department with cough, congestion, fever.  Lungs are clear.  Viral panel negative.  While in the ED, he does have O2 desaturation into the 80s with sleeping that promptly recovers when awake.  We had a discussion regarding OSA and states he has been told about this in the past and maybe someone has mentioned using a CPAP but he does not use this regularly.  Reevaluation with update and discussion with patient.  Chest x-ray clear.  No pneumonia.  Plan for continued supportive care at home, Tylenol/ibuprofen as needed for fever, will send cough medicine.  Also strongly encouraged him to call his PCP today to discussed referral for sleep study and formal diagnosis of sleep apnea which he almost certainly has. Patient verbalizes understanding.   Patient's presentation is most consistent with acute, uncomplicated illness.   Disposition: discharge  ____________________________________________  FINAL CLINICAL IMPRESSION(S) / ED DIAGNOSES  Final diagnoses:  Acute cough     NEW OUTPATIENT MEDICATIONS STARTED DURING THIS VISIT:  New Prescriptions   BENZONATATE (TESSALON) 100 MG CAPSULE    Take 1 capsule (100 mg total) by mouth 3 (three) times daily as needed for cough.    Note:  This document was prepared using Dragon voice recognition software and may include unintentional dictation errors.  Elijah Bene, MD, [facility]    Earline Stiner, Arlyss Repress, MD 03/13/24 [phone]

## 2024-03-13 NOTE — ED Notes (Signed)
 Patient snoring, noted drop in spo2 to 80s. Provider notified, evaluated by RT and placed on 2L o2 via [place]. Verbal Education provided about sleep apnea, patient verbalized understanding and reports being told by other providers he has sleep apnea

## 2024-03-13 NOTE — ED Notes (Signed)
 Reviewed AVS/discharge instruction with patient. Time allotted for and all questions answered. Patient is agreeable for d/c and escorted to ed exit by staff.

## 2024-03-13 NOTE — Discharge Instructions (Addendum)
 We believe your persistent cough is a result of a viral syndrome for which your symptoms have still not quite resolved.  Sometimes it takes many weeks to completely go away, especially if you smoke or have chronic lung problems.  Please take any medications prescribed and follow up as recommended with your regular doctor.  Please call your primary care doctor today.  I suspect that you have sleep apnea and need a rapid sleep study and likely CPAP or oxygen support while sleeping.  They can help arrange this.   If you develop any new or worsening symptoms, including but not limited to fever, persistent vomiting, worsening shortness of breath, or other symptoms that concern you, please return to the Emergency Department immediately.

## 2024-03-13 NOTE — ED Triage Notes (Signed)
 Pt c/o HA, "little cough," onset Saturday. Also c/o R knee pain, scheduled to have L knee replaced  Otc meds w relief

## 2025-01-01 ENCOUNTER — Ambulatory Visit: Payer: Self-pay | Admitting: Orthopedic Surgery

## 2025-01-25 ENCOUNTER — Encounter (HOSPITAL_COMMUNITY): Admission: RE | Admit: 2025-01-25 | Payer: Self-pay | Source: Ambulatory Visit

## 2025-02-07 ENCOUNTER — Ambulatory Visit (HOSPITAL_COMMUNITY): Admit: 2025-02-07 | Payer: Self-pay | Admitting: Specialist

## 2025-02-07 SURGERY — ARTHROPLASTY, KNEE, TOTAL
Anesthesia: Spinal | Site: Knee | Laterality: Left
